# Patient Record
Sex: Female | Born: 1935 | Race: Black or African American | Hispanic: No | Smoking: Never smoker
Health system: Southern US, Community
[De-identification: ages and names within clinical notes are randomized; demographics above are authoritative.]

## PROBLEM LIST (undated history)

## (undated) DIAGNOSIS — N189 Chronic kidney disease, unspecified: Secondary | ICD-10-CM

## (undated) DIAGNOSIS — D649 Anemia, unspecified: Secondary | ICD-10-CM

## (undated) DIAGNOSIS — R3981 Functional urinary incontinence: Secondary | ICD-10-CM

## (undated) DIAGNOSIS — E039 Hypothyroidism, unspecified: Secondary | ICD-10-CM

## (undated) DIAGNOSIS — F419 Anxiety disorder, unspecified: Secondary | ICD-10-CM

## (undated) DIAGNOSIS — Z992 Dependence on renal dialysis: Secondary | ICD-10-CM

## (undated) DIAGNOSIS — N186 End stage renal disease: Secondary | ICD-10-CM

## (undated) DIAGNOSIS — I10 Essential (primary) hypertension: Secondary | ICD-10-CM

## (undated) DIAGNOSIS — E785 Hyperlipidemia, unspecified: Secondary | ICD-10-CM

## (undated) HISTORY — DX: Anemia, unspecified: D64.9

## (undated) HISTORY — DX: Essential (primary) hypertension: I10

## (undated) HISTORY — DX: Hyperlipidemia, unspecified: E78.5

## (undated) HISTORY — DX: Chronic kidney disease, unspecified: N18.9

---

## 2006-04-19 ENCOUNTER — Emergency Department (HOSPITAL_COMMUNITY): Admission: EM | Admit: 2006-04-19 | Discharge: 2006-04-19 | Payer: Self-pay | Admitting: Emergency Medicine

## 2006-04-21 ENCOUNTER — Emergency Department (HOSPITAL_COMMUNITY): Admission: EM | Admit: 2006-04-21 | Discharge: 2006-04-21 | Payer: Self-pay | Admitting: Family Medicine

## 2009-09-13 LAB — HM MAMMOGRAPHY

## 2009-10-03 ENCOUNTER — Ambulatory Visit: Payer: Self-pay | Admitting: Oncology

## 2009-10-13 HISTORY — PX: TRANSTHORACIC ECHOCARDIOGRAM: SHX275

## 2009-10-13 HISTORY — PX: NM MYOCAR PERF WALL MOTION: HXRAD629

## 2009-10-16 ENCOUNTER — Ambulatory Visit (HOSPITAL_COMMUNITY)
Admission: RE | Admit: 2009-10-16 | Discharge: 2009-10-16 | Payer: Self-pay | Source: Home / Self Care | Admitting: Oncology

## 2009-10-16 LAB — CBC WITH DIFFERENTIAL/PLATELET
BASO%: 0.3 % (ref 0.0–2.0)
Basophils Absolute: 0 10*3/uL (ref 0.0–0.1)
EOS%: 2.2 % (ref 0.0–7.0)
Eosinophils Absolute: 0.2 10*3/uL (ref 0.0–0.5)
HCT: 31.2 % — ABNORMAL LOW (ref 34.8–46.6)
HGB: 10 g/dL — ABNORMAL LOW (ref 11.6–15.9)
LYMPH%: 19.3 % (ref 14.0–49.7)
MCH: 29.7 pg (ref 25.1–34.0)
MCHC: 32.1 g/dL (ref 31.5–36.0)
MCV: 92.6 fL (ref 79.5–101.0)
MONO#: 0.3 10*3/uL (ref 0.1–0.9)
MONO%: 4.3 % (ref 0.0–14.0)
NEUT#: 5.4 10*3/uL (ref 1.5–6.5)
NEUT%: 73.9 % (ref 38.4–76.8)
Platelets: 215 10*3/uL (ref 145–400)
RBC: 3.37 10*6/uL — ABNORMAL LOW (ref 3.70–5.45)
RDW: 14.3 % (ref 11.2–14.5)
WBC: 7.2 10*3/uL (ref 3.9–10.3)
lymph#: 1.4 10*3/uL (ref 0.9–3.3)

## 2009-10-18 LAB — COMPREHENSIVE METABOLIC PANEL
ALT: 12 U/L (ref 0–35)
AST: 30 U/L (ref 0–37)
Albumin: 3.7 g/dL (ref 3.5–5.2)
Alkaline Phosphatase: 99 U/L (ref 39–117)
BUN: 49 mg/dL — ABNORMAL HIGH (ref 6–23)
CO2: 26 mEq/L (ref 19–32)
Calcium: 9.5 mg/dL (ref 8.4–10.5)
Chloride: 99 mEq/L (ref 96–112)
Creatinine, Ser: 2.48 mg/dL — ABNORMAL HIGH (ref 0.40–1.20)
Glucose, Bld: 127 mg/dL — ABNORMAL HIGH (ref 70–99)
Potassium: 4.6 mEq/L (ref 3.5–5.3)
Sodium: 135 mEq/L (ref 135–145)
Total Bilirubin: 0.8 mg/dL (ref 0.3–1.2)
Total Protein: 6.9 g/dL (ref 6.0–8.3)

## 2009-10-18 LAB — KAPPA/LAMBDA LIGHT CHAINS
Kappa free light chain: 3.84 mg/dL — ABNORMAL HIGH (ref 0.33–1.94)
Kappa:Lambda Ratio: 1.21 (ref 0.26–1.65)
Lambda Free Lght Chn: 3.17 mg/dL — ABNORMAL HIGH (ref 0.57–2.63)

## 2009-10-18 LAB — SPEP & IFE WITH QIG
Albumin ELP: 52.1 % — ABNORMAL LOW (ref 55.8–66.1)
Alpha-1-Globulin: 4.8 % (ref 2.9–4.9)
Alpha-2-Globulin: 13.3 % — ABNORMAL HIGH (ref 7.1–11.8)
Beta 2: 4.8 % (ref 3.2–6.5)
Beta Globulin: 5.1 % (ref 4.7–7.2)
Gamma Globulin: 19.9 % — ABNORMAL HIGH (ref 11.1–18.8)
IgA: 160 mg/dL (ref 68–378)
IgG (Immunoglobin G), Serum: 1540 mg/dL (ref 694–1618)
IgM, Serum: 58 mg/dL — ABNORMAL LOW (ref 60–263)
M-Spike, %: 0.86 g/dL
Total Protein, Serum Electrophoresis: 6.9 g/dL (ref 6.0–8.3)

## 2009-11-02 ENCOUNTER — Ambulatory Visit: Payer: Self-pay | Admitting: Oncology

## 2010-01-22 ENCOUNTER — Encounter (HOSPITAL_COMMUNITY)
Admission: RE | Admit: 2010-01-22 | Discharge: 2010-02-12 | Payer: Self-pay | Source: Home / Self Care | Attending: Nephrology | Admitting: Nephrology

## 2010-01-28 LAB — HEMOGLOBIN: Hemoglobin: 9.7 g/dL — ABNORMAL LOW (ref 12.0–15.0)

## 2010-02-01 LAB — IRON AND TIBC
Iron: 47 ug/dL (ref 42–135)
Saturation Ratios: 21 % (ref 20–55)
TIBC: 229 ug/dL — ABNORMAL LOW (ref 250–470)
UIBC: 182 ug/dL

## 2010-02-01 LAB — FERRITIN: Ferritin: 110 ng/mL (ref 10–291)

## 2010-02-04 LAB — RENAL FUNCTION PANEL
Albumin: 3 g/dL — ABNORMAL LOW (ref 3.5–5.2)
BUN: 65 mg/dL — ABNORMAL HIGH (ref 6–23)
CO2: 29 mEq/L (ref 19–32)
Calcium: 9.7 mg/dL (ref 8.4–10.5)
Chloride: 100 mEq/L (ref 96–112)
Creatinine, Ser: 3.31 mg/dL — ABNORMAL HIGH (ref 0.4–1.2)
GFR calc Af Amer: 16 mL/min — ABNORMAL LOW (ref >60)
GFR calc non Af Amer: 14 mL/min — ABNORMAL LOW (ref >60)
Glucose, Bld: 129 mg/dL — ABNORMAL HIGH (ref 70–99)
Phosphorus: 3.8 mg/dL (ref 2.3–4.6)
Potassium: 4.1 mEq/L (ref 3.5–5.1)
Sodium: 138 mEq/L (ref 135–145)

## 2010-02-04 LAB — HEMOGLOBIN: Hemoglobin: 10.4 g/dL — ABNORMAL LOW (ref 12.0–15.0)

## 2010-02-06 LAB — HEMOGLOBIN: Hemoglobin: 10.7 g/dL — ABNORMAL LOW (ref 12.0–15.0)

## 2010-02-14 ENCOUNTER — Encounter (HOSPITAL_COMMUNITY): Payer: Medicare HMO

## 2010-02-18 ENCOUNTER — Other Ambulatory Visit: Payer: Self-pay | Admitting: Nephrology

## 2010-02-18 ENCOUNTER — Ambulatory Visit (HOSPITAL_COMMUNITY): Payer: Medicare HMO | Attending: Nephrology

## 2010-02-18 DIAGNOSIS — D649 Anemia, unspecified: Secondary | ICD-10-CM | POA: Insufficient documentation

## 2010-02-18 LAB — HEMOGLOBIN AND HEMATOCRIT, BLOOD
HCT: 35.8 % — ABNORMAL LOW (ref 36.0–46.0)
Hemoglobin: 11.5 g/dL — ABNORMAL LOW (ref 12.0–15.0)

## 2010-02-18 LAB — FERRITIN: Ferritin: 894 ng/mL — ABNORMAL HIGH (ref 10–291)

## 2010-02-18 LAB — IRON AND TIBC
Iron: 60 ug/dL (ref 42–135)
Saturation Ratios: 30 % (ref 20–55)
TIBC: 199 ug/dL — ABNORMAL LOW (ref 250–470)
UIBC: 139 ug/dL

## 2010-02-26 ENCOUNTER — Other Ambulatory Visit (HOSPITAL_COMMUNITY): Payer: Medicare HMO

## 2010-02-27 ENCOUNTER — Encounter (HOSPITAL_COMMUNITY): Payer: Medicare HMO | Attending: Nephrology

## 2010-02-27 ENCOUNTER — Other Ambulatory Visit: Payer: Self-pay | Admitting: Nephrology

## 2010-02-27 DIAGNOSIS — N184 Chronic kidney disease, stage 4 (severe): Secondary | ICD-10-CM | POA: Insufficient documentation

## 2010-02-27 DIAGNOSIS — D638 Anemia in other chronic diseases classified elsewhere: Secondary | ICD-10-CM | POA: Insufficient documentation

## 2010-02-27 LAB — RENAL FUNCTION PANEL
Albumin: 3 g/dL — ABNORMAL LOW (ref 3.5–5.2)
BUN: 63 mg/dL — ABNORMAL HIGH (ref 6–23)
CO2: 28 mEq/L (ref 19–32)
Calcium: 9.5 mg/dL (ref 8.4–10.5)
Chloride: 100 mEq/L (ref 96–112)
Creatinine, Ser: 3.41 mg/dL — ABNORMAL HIGH (ref 0.4–1.2)
GFR calc Af Amer: 16 mL/min — ABNORMAL LOW (ref >60)
GFR calc non Af Amer: 13 mL/min — ABNORMAL LOW (ref >60)
Glucose, Bld: 136 mg/dL — ABNORMAL HIGH (ref 70–99)
Phosphorus: 4.2 mg/dL (ref 2.3–4.6)
Potassium: 4.6 mEq/L (ref 3.5–5.1)
Sodium: 137 mEq/L (ref 135–145)

## 2010-02-27 LAB — HEMOGLOBIN: Hemoglobin: 11.8 g/dL — ABNORMAL LOW (ref 12.0–15.0)

## 2010-03-05 ENCOUNTER — Other Ambulatory Visit: Payer: Self-pay | Admitting: Nephrology

## 2010-03-05 ENCOUNTER — Encounter (HOSPITAL_COMMUNITY): Payer: Medicare HMO

## 2010-03-05 LAB — HEMOGLOBIN: Hemoglobin: 11.8 g/dL — ABNORMAL LOW (ref 12.0–15.0)

## 2010-03-13 ENCOUNTER — Encounter (HOSPITAL_COMMUNITY): Payer: Medicare HMO

## 2010-03-20 ENCOUNTER — Other Ambulatory Visit: Payer: Self-pay | Admitting: Nephrology

## 2010-03-20 ENCOUNTER — Encounter (HOSPITAL_COMMUNITY): Payer: Medicare HMO | Attending: Nephrology

## 2010-03-20 DIAGNOSIS — D638 Anemia in other chronic diseases classified elsewhere: Secondary | ICD-10-CM | POA: Insufficient documentation

## 2010-03-20 DIAGNOSIS — N184 Chronic kidney disease, stage 4 (severe): Secondary | ICD-10-CM | POA: Insufficient documentation

## 2010-03-20 LAB — HEMOGLOBIN AND HEMATOCRIT, BLOOD
HCT: 38.1 % (ref 36.0–46.0)
Hemoglobin: 12.2 g/dL (ref 12.0–15.0)

## 2010-03-20 LAB — FERRITIN: Ferritin: 506 ng/mL — ABNORMAL HIGH (ref 10–291)

## 2010-03-20 LAB — IRON AND TIBC
Iron: 80 ug/dL (ref 42–135)
Saturation Ratios: 39 % (ref 20–55)
TIBC: 206 ug/dL — ABNORMAL LOW (ref 250–470)
UIBC: 126 ug/dL

## 2010-03-27 ENCOUNTER — Encounter (HOSPITAL_COMMUNITY): Payer: Medicare HMO

## 2010-04-02 ENCOUNTER — Encounter (HOSPITAL_COMMUNITY): Payer: Medicare HMO

## 2010-04-10 ENCOUNTER — Other Ambulatory Visit: Payer: Self-pay | Admitting: Nephrology

## 2010-04-10 ENCOUNTER — Other Ambulatory Visit: Payer: Self-pay | Admitting: Oncology

## 2010-04-10 ENCOUNTER — Encounter (HOSPITAL_COMMUNITY): Payer: Medicare HMO

## 2010-04-10 ENCOUNTER — Encounter (HOSPITAL_BASED_OUTPATIENT_CLINIC_OR_DEPARTMENT_OTHER): Payer: Medicare HMO | Admitting: Oncology

## 2010-04-10 DIAGNOSIS — D472 Monoclonal gammopathy: Secondary | ICD-10-CM

## 2010-04-10 DIAGNOSIS — N189 Chronic kidney disease, unspecified: Secondary | ICD-10-CM

## 2010-04-10 DIAGNOSIS — D631 Anemia in chronic kidney disease: Secondary | ICD-10-CM

## 2010-04-10 DIAGNOSIS — I129 Hypertensive chronic kidney disease with stage 1 through stage 4 chronic kidney disease, or unspecified chronic kidney disease: Secondary | ICD-10-CM

## 2010-04-10 LAB — IRON AND TIBC
Iron: 110 ug/dL (ref 42–135)
Saturation Ratios: 53 % (ref 20–55)
TIBC: 206 ug/dL — ABNORMAL LOW (ref 250–470)
UIBC: 96 ug/dL

## 2010-04-10 LAB — CBC WITH DIFFERENTIAL/PLATELET
BASO%: 0.2 % (ref 0.0–2.0)
Basophils Absolute: 0 10*3/uL (ref 0.0–0.1)
EOS%: 3.7 % (ref 0.0–7.0)
Eosinophils Absolute: 0.2 10*3/uL (ref 0.0–0.5)
HCT: 38.5 % (ref 34.8–46.6)
HGB: 12.4 g/dL (ref 11.6–15.9)
LYMPH%: 11.4 % — ABNORMAL LOW (ref 14.0–49.7)
MCH: 27.7 pg (ref 25.1–34.0)
MCHC: 32.2 g/dL (ref 31.5–36.0)
MCV: 86.1 fL (ref 79.5–101.0)
MONO#: 0.3 10*3/uL (ref 0.1–0.9)
MONO%: 4.6 % (ref 0.0–14.0)
NEUT#: 5.1 10*3/uL (ref 1.5–6.5)
NEUT%: 80.1 % — ABNORMAL HIGH (ref 38.4–76.8)
Platelets: 187 10*3/uL (ref 145–400)
RBC: 4.47 10*6/uL (ref 3.70–5.45)
RDW: 12.6 % (ref 11.2–14.5)
WBC: 6.4 10*3/uL (ref 3.9–10.3)
lymph#: 0.7 10*3/uL — ABNORMAL LOW (ref 0.9–3.3)

## 2010-04-10 LAB — COMPREHENSIVE METABOLIC PANEL
ALT: 23 U/L (ref 0–35)
AST: 37 U/L (ref 0–37)
Albumin: 3.3 g/dL — ABNORMAL LOW (ref 3.5–5.2)
Alkaline Phosphatase: 68 U/L (ref 39–117)
BUN: 61 mg/dL — ABNORMAL HIGH (ref 6–23)
CO2: 30 mEq/L (ref 19–32)
Calcium: 9.3 mg/dL (ref 8.4–10.5)
Chloride: 99 mEq/L (ref 96–112)
Creatinine, Ser: 3.09 mg/dL — ABNORMAL HIGH (ref 0.40–1.20)
Glucose, Bld: 98 mg/dL (ref 70–99)
Potassium: 4.3 mEq/L (ref 3.5–5.3)
Sodium: 138 mEq/L (ref 135–145)
Total Bilirubin: 0.7 mg/dL (ref 0.3–1.2)
Total Protein: 7.2 g/dL (ref 6.0–8.3)

## 2010-04-10 LAB — HEMOGLOBIN: Hemoglobin: 11.8 g/dL — ABNORMAL LOW (ref 12.0–15.0)

## 2010-04-10 LAB — FERRITIN: Ferritin: 498 ng/mL — ABNORMAL HIGH (ref 10–291)

## 2010-04-12 LAB — IMMUNOFIXATION ELECTROPHORESIS
IgA: 154 mg/dL (ref 68–378)
IgG (Immunoglobin G), Serum: 1470 mg/dL (ref 694–1618)
IgM, Serum: 49 mg/dL — ABNORMAL LOW (ref 60–263)
Total Protein, Serum Electrophoresis: 6.9 g/dL (ref 6.0–8.3)

## 2010-04-12 LAB — KAPPA/LAMBDA LIGHT CHAINS
Kappa free light chain: 5.9 mg/dL — ABNORMAL HIGH (ref 0.33–1.94)
Kappa:Lambda Ratio: 1.05 (ref 0.26–1.65)
Lambda Free Lght Chn: 5.6 mg/dL — ABNORMAL HIGH (ref 0.57–2.63)

## 2010-04-24 ENCOUNTER — Encounter (HOSPITAL_COMMUNITY): Payer: Medicare HMO | Attending: Nephrology

## 2010-04-24 ENCOUNTER — Other Ambulatory Visit: Payer: Self-pay | Admitting: Nephrology

## 2010-04-24 DIAGNOSIS — N184 Chronic kidney disease, stage 4 (severe): Secondary | ICD-10-CM | POA: Insufficient documentation

## 2010-04-24 DIAGNOSIS — D638 Anemia in other chronic diseases classified elsewhere: Secondary | ICD-10-CM | POA: Insufficient documentation

## 2010-04-24 LAB — HEMOGLOBIN: Hemoglobin: 12 g/dL (ref 12.0–15.0)

## 2010-05-03 ENCOUNTER — Ambulatory Visit (INDEPENDENT_AMBULATORY_CARE_PROVIDER_SITE_OTHER): Payer: Medicare HMO | Admitting: Internal Medicine

## 2010-05-03 ENCOUNTER — Encounter: Payer: Self-pay | Admitting: Internal Medicine

## 2010-05-03 VITALS — BP 134/78 | HR 58 | Temp 98.1°F | Resp 16

## 2010-05-03 DIAGNOSIS — F419 Anxiety disorder, unspecified: Secondary | ICD-10-CM | POA: Insufficient documentation

## 2010-05-03 DIAGNOSIS — E7800 Pure hypercholesterolemia, unspecified: Secondary | ICD-10-CM | POA: Insufficient documentation

## 2010-05-03 DIAGNOSIS — I10 Essential (primary) hypertension: Secondary | ICD-10-CM | POA: Insufficient documentation

## 2010-05-03 DIAGNOSIS — F411 Generalized anxiety disorder: Secondary | ICD-10-CM

## 2010-05-03 DIAGNOSIS — E782 Mixed hyperlipidemia: Secondary | ICD-10-CM

## 2010-05-03 MED ORDER — ROSUVASTATIN CALCIUM 10 MG PO TABS: 10.0000 mg | ORAL_TABLET | Freq: Every day | ORAL | Status: DC

## 2010-05-03 MED ORDER — DIAZEPAM 5 MG PO TABS: 5.0000 mg | ORAL_TABLET | Freq: Two times a day (BID) | ORAL | Status: DC

## 2010-05-03 NOTE — Patient Instructions (Signed)
Anxiety and Panic Attacks Your caregiver has informed you that you are having an anxiety or panic attack. There may be many forms of this. Most of the time these attacks come suddenly and without warning. They come at any time of day, including periods of sleep, and at any time of life. They may be strong and unexplained. Although panic attacks are very scary, they are physically harmless. Sometimes the cause of your anxiety is not known. Anxiety is a protective mechanism of the body in its fight or flight mechanism. Most of these perceived danger situations are actually nonphysical situations (such as anxiety over losing a job). CAUSES The causes of an anxiety or panic attack are many. Panic attacks may occur in otherwise healthy people given a certain set of circumstances. There may be a genetic cause for panic attacks. Some medications may also have anxiety as a side effect. SYMPTOMS Some of the most common feelings are:  Intense terror.  Dizziness, feeling faint.   Hot and cold flashes.   Fear of going crazy.   Feelings that nothing is real.   Sweating.   Shaking.   Chest pain or a fast heartbeat (palpitations).  Smothering, choking sensations.   Feelings of impending doom and that death is near.   Tingling of extremities, this may be from over breathing.   Altered reality (derealization).   Being detached from yourself (depersonalization).   Several symptoms can be present to make up anxiety or panic attacks. DIAGNOSIS The evaluation by your caregiver will depend on the type of symptoms you are experiencing. The diagnosis of anxiety or pain attack is made when no physical illness can be determined to be a cause of the symptoms. TREATMENT Treatment to prevent anxiety and panic attacks may include:  Avoidance of circumstances that cause anxiety.   Reassurance and relaxation.   Regular exercise.   Relaxation therapies, such as yoga.   Psychotherapy with a psychiatrist  or therapist.   Avoidance of caffeine, alcohol and illegal drugs.   Prescribed medication.  SEEK IMMEDIATE MEDICAL CARE IF:  You experience panic attack symptoms that are different than your usual symptoms.   You have any worsening or concerning symptoms.  Document Released: 12/30/2004 Document Re-Released: 06/19/2009 Exit[employer] Patient Information 2011 [employer]. 

## 2010-05-03 NOTE — Assessment & Plan Note (Signed)
Her BP is well controlled 

## 2010-05-03 NOTE — Assessment & Plan Note (Signed)
Continue valium prn 

## 2010-05-03 NOTE — Assessment & Plan Note (Signed)
Continue crestor 

## 2010-05-03 NOTE — Progress Notes (Signed)
Subjective:    Patient ID: Jaclyn Morgan, female    DOB: Sep 03, 1932, 77 y.o.   MRN: [id number]  Hypertension This is a chronic problem. The current episode started more than 1 year ago. The problem has been gradually improving since onset. The problem is controlled. Associated symptoms include anxiety. Pertinent negatives include no blurred vision, chest pain, headaches, malaise/fatigue, neck pain, orthopnea, palpitations, peripheral edema, PND, shortness of breath or sweats. There are no associated agents to hypertension. Past treatments include beta blockers, diuretics and alpha 1 blockers. The current treatment provides moderate improvement. There are no compliance problems.  Hypertensive end-organ damage includes kidney disease. Identifiable causes of hypertension include chronic renal disease.  Anxiety Presents for follow-up visit. Symptoms include excessive worry, nervous/anxious behavior and panic. Patient reports no chest pain, compulsions, confusion, decreased concentration, depressed mood, dizziness, dry mouth, feeling of choking, hyperventilation, insomnia, irritability, malaise, muscle tension, nausea, obsessions, palpitations, restlessness, shortness of breath or suicidal ideas. Symptoms occur most days. The severity of symptoms is moderate. The patient sleeps 7 hours per night. The quality of sleep is good. Nighttime awakenings: occasional.   Compliance with medications is 76-100%.   She tells me that she has been seeing a kidney doctor, cardiologist, blood specialist, and an eye doctor recently.   Review of Systems  Constitutional: Negative for malaise/fatigue and irritability.  HENT: Negative for neck pain.   Eyes: Negative for blurred vision.  Respiratory: Negative for shortness of breath.   Cardiovascular: Negative for chest pain, palpitations, orthopnea and PND.  Gastrointestinal: Negative for nausea.  Neurological: Negative for dizziness and headaches.    Psychiatric/Behavioral: Negative for suicidal ideas, confusion and decreased concentration. The patient is nervous/anxious. The patient does not have insomnia.    Lab Results  Component Value Date   HGB 12.0 04/24/2010   HCT 38.5 04/10/2010   PLT 187 04/10/2010   ALT 23 04/10/2010   AST 37 04/10/2010   NA 138 04/10/2010   K 4.3 04/10/2010   CL 99 04/10/2010   CREATININE 3.09* 04/10/2010   BUN 61* 04/10/2010   CO2 30 04/10/2010      Objective:   Physical Exam  Constitutional: She is oriented to person, place, and time. She appears well-developed and well-nourished. No distress.  HENT:  Head: Normocephalic and atraumatic.  Right Ear: External ear normal.  Left Ear: External ear normal.  Nose: Nose normal.  Mouth/Throat: No oropharyngeal exudate.  Eyes: Conjunctivae and EOM are normal. Pupils are equal, round, and reactive to light. Right eye exhibits no discharge. Left eye exhibits no discharge. No scleral icterus.  Neck: Normal range of motion. Neck supple. No JVD present. No tracheal deviation present. No thyromegaly present.  Cardiovascular: Normal rate, regular rhythm, normal heart sounds and intact distal pulses.  Exam reveals no gallop and no friction rub.   No murmur heard. Pulmonary/Chest: Effort normal and breath sounds normal. No stridor. No respiratory distress. She has no wheezes. She has no rales. She exhibits no tenderness.  Abdominal: Soft. Bowel sounds are normal. She exhibits no distension and no mass. There is no tenderness. There is no rebound and no guarding.  Musculoskeletal: Normal range of motion. She exhibits no tenderness.  Lymphadenopathy:    She has no cervical adenopathy.  Neurological: She is alert and oriented to person, place, and time. She has normal reflexes. No cranial nerve deficit. She exhibits normal muscle tone. Coordination normal.  Skin: Skin is warm and dry. No rash noted. She is not diaphoretic. No erythema.  No pallor.  Psychiatric: She has a normal  mood and affect. Her behavior is normal. Judgment and thought content normal.          Assessment & Plan:

## 2010-05-08 ENCOUNTER — Other Ambulatory Visit: Payer: Self-pay | Admitting: Nephrology

## 2010-05-08 ENCOUNTER — Encounter (HOSPITAL_COMMUNITY): Payer: Medicare HMO

## 2010-05-08 DIAGNOSIS — N189 Chronic kidney disease, unspecified: Secondary | ICD-10-CM | POA: Insufficient documentation

## 2010-05-08 DIAGNOSIS — D649 Anemia, unspecified: Secondary | ICD-10-CM | POA: Insufficient documentation

## 2010-05-08 LAB — RENAL FUNCTION PANEL
Albumin: 3.2 g/dL — ABNORMAL LOW (ref 3.5–5.2)
BUN: 65 mg/dL — ABNORMAL HIGH (ref 6–23)
CO2: 29 mEq/L (ref 19–32)
Calcium: 9.5 mg/dL (ref 8.4–10.5)
Chloride: 101 mEq/L (ref 96–112)
Creatinine, Ser: 3.04 mg/dL — ABNORMAL HIGH (ref 0.4–1.2)
GFR calc Af Amer: 18 mL/min — ABNORMAL LOW (ref >60)
GFR calc non Af Amer: 15 mL/min — ABNORMAL LOW (ref >60)
Glucose, Bld: 111 mg/dL — ABNORMAL HIGH (ref 70–99)
Phosphorus: 3.2 mg/dL (ref 2.3–4.6)
Potassium: 3.6 mEq/L (ref 3.5–5.1)
Sodium: 139 mEq/L (ref 135–145)

## 2010-05-08 LAB — HEMOGLOBIN: Hemoglobin: 12.1 g/dL (ref 12.0–15.0)

## 2010-05-09 LAB — IRON AND TIBC
Iron: 91 ug/dL (ref 42–135)
Saturation Ratios: 44 % (ref 20–55)
TIBC: 205 ug/dL — ABNORMAL LOW (ref 250–470)
UIBC: 114 ug/dL

## 2010-05-09 LAB — FERRITIN: Ferritin: 531 ng/mL — ABNORMAL HIGH (ref 10–291)

## 2010-05-29 ENCOUNTER — Encounter (HOSPITAL_COMMUNITY): Payer: Medicare HMO | Attending: Nephrology

## 2010-05-29 ENCOUNTER — Other Ambulatory Visit: Payer: Self-pay | Admitting: Nephrology

## 2010-05-29 DIAGNOSIS — D649 Anemia, unspecified: Secondary | ICD-10-CM | POA: Insufficient documentation

## 2010-05-29 LAB — HEMOGLOBIN: Hemoglobin: 10.8 g/dL — ABNORMAL LOW (ref 12.0–15.0)

## 2010-06-07 HISTORY — PX: CATARACT EXTRACTION: SUR2

## 2010-06-12 ENCOUNTER — Other Ambulatory Visit: Payer: Self-pay | Admitting: Nephrology

## 2010-06-12 ENCOUNTER — Encounter (HOSPITAL_COMMUNITY): Payer: Medicare HMO

## 2010-06-12 LAB — RENAL FUNCTION PANEL
BUN: 62 mg/dL — ABNORMAL HIGH (ref 6–23)
CO2: 28 mEq/L (ref 19–32)
Calcium: 9.5 mg/dL (ref 8.4–10.5)
Glucose, Bld: 123 mg/dL — ABNORMAL HIGH (ref 70–99)
Phosphorus: 3.9 mg/dL (ref 2.3–4.6)

## 2010-06-12 LAB — HEMOGLOBIN AND HEMATOCRIT, BLOOD: Hemoglobin: 10.9 g/dL — ABNORMAL LOW (ref 12.0–15.0)

## 2010-06-13 LAB — IRON AND TIBC
TIBC: 200 ug/dL — ABNORMAL LOW (ref 250–470)
UIBC: 120 ug/dL

## 2010-06-13 LAB — FERRITIN: Ferritin: 482 ng/mL — ABNORMAL HIGH (ref 10–291)

## 2010-06-26 ENCOUNTER — Ambulatory Visit (HOSPITAL_COMMUNITY)
Admission: RE | Admit: 2010-06-26 | Discharge: 2010-06-26 | Disposition: A | Discharge status: Home / Self Care | Payer: Medicare HMO | Source: Ambulatory Visit | Attending: Nephrology | Admitting: Nephrology

## 2010-06-26 ENCOUNTER — Other Ambulatory Visit: Payer: Self-pay | Admitting: Nephrology

## 2010-06-26 DIAGNOSIS — D649 Anemia, unspecified: Secondary | ICD-10-CM | POA: Insufficient documentation

## 2010-06-26 LAB — HEMOGLOBIN: Hemoglobin: 11 g/dL — ABNORMAL LOW (ref 12.0–15.0)

## 2010-07-11 ENCOUNTER — Other Ambulatory Visit (HOSPITAL_COMMUNITY): Payer: Medicare HMO

## 2010-07-16 ENCOUNTER — Other Ambulatory Visit: Payer: Self-pay | Admitting: Nephrology

## 2010-07-16 ENCOUNTER — Ambulatory Visit (HOSPITAL_COMMUNITY): Payer: Medicare HMO | Attending: Nephrology

## 2010-07-16 DIAGNOSIS — N184 Chronic kidney disease, stage 4 (severe): Secondary | ICD-10-CM | POA: Insufficient documentation

## 2010-07-16 DIAGNOSIS — D638 Anemia in other chronic diseases classified elsewhere: Secondary | ICD-10-CM | POA: Insufficient documentation

## 2010-07-16 LAB — RENAL FUNCTION PANEL
CO2: 27 mEq/L (ref 19–32)
Calcium: 10.2 mg/dL (ref 8.4–10.5)
Creatinine, Ser: 3.07 mg/dL — ABNORMAL HIGH (ref 0.50–1.10)
Glucose, Bld: 90 mg/dL (ref 70–99)

## 2010-07-16 LAB — IRON AND TIBC
Saturation Ratios: 41 % (ref 20–55)
UIBC: 124 ug/dL

## 2010-07-16 LAB — HEMOGLOBIN AND HEMATOCRIT, BLOOD: HCT: 33.5 % — ABNORMAL LOW (ref 36.0–46.0)

## 2010-07-16 LAB — FERRITIN: Ferritin: 445 ng/mL — ABNORMAL HIGH (ref 10–291)

## 2010-07-23 ENCOUNTER — Other Ambulatory Visit (HOSPITAL_COMMUNITY): Payer: Medicare HMO

## 2010-07-30 ENCOUNTER — Other Ambulatory Visit: Payer: Self-pay | Admitting: Nephrology

## 2010-07-30 ENCOUNTER — Ambulatory Visit (HOSPITAL_COMMUNITY): Payer: Medicare HMO | Attending: Nephrology

## 2010-07-30 DIAGNOSIS — D649 Anemia, unspecified: Secondary | ICD-10-CM | POA: Insufficient documentation

## 2010-07-30 LAB — HEMOGLOBIN: Hemoglobin: 10.5 g/dL — ABNORMAL LOW (ref 12.0–15.0)

## 2010-08-13 ENCOUNTER — Other Ambulatory Visit: Payer: Self-pay | Admitting: Nephrology

## 2010-08-13 ENCOUNTER — Encounter (HOSPITAL_COMMUNITY): Payer: Medicare HMO | Attending: Nephrology

## 2010-08-13 DIAGNOSIS — D649 Anemia, unspecified: Secondary | ICD-10-CM | POA: Insufficient documentation

## 2010-08-13 LAB — RENAL FUNCTION PANEL
BUN: 78 mg/dL — ABNORMAL HIGH (ref 6–23)
CO2: 30 mEq/L (ref 19–32)
Chloride: 97 mEq/L (ref 96–112)
GFR calc Af Amer: 16 mL/min — ABNORMAL LOW (ref >60)
Glucose, Bld: 155 mg/dL — ABNORMAL HIGH (ref 70–99)
Potassium: 4.1 mEq/L (ref 3.5–5.1)
Sodium: 136 mEq/L (ref 135–145)

## 2010-08-13 LAB — IRON AND TIBC
Iron: 61 ug/dL (ref 42–135)
TIBC: 203 ug/dL — ABNORMAL LOW (ref 250–470)
UIBC: 142 ug/dL

## 2010-08-13 LAB — FERRITIN: Ferritin: 421 ng/mL — ABNORMAL HIGH (ref 10–291)

## 2010-08-27 ENCOUNTER — Encounter (HOSPITAL_COMMUNITY): Payer: Medicare HMO | Attending: Nephrology

## 2010-08-27 ENCOUNTER — Other Ambulatory Visit: Payer: Self-pay | Admitting: Nephrology

## 2010-08-27 ENCOUNTER — Other Ambulatory Visit (HOSPITAL_COMMUNITY): Payer: Medicare HMO

## 2010-08-27 DIAGNOSIS — D649 Anemia, unspecified: Secondary | ICD-10-CM | POA: Insufficient documentation

## 2010-08-28 ENCOUNTER — Other Ambulatory Visit (HOSPITAL_COMMUNITY): Payer: Medicare HMO

## 2010-09-10 ENCOUNTER — Other Ambulatory Visit: Payer: Self-pay | Admitting: Nephrology

## 2010-09-10 ENCOUNTER — Encounter (HOSPITAL_COMMUNITY): Payer: Medicare HMO

## 2010-09-10 LAB — IRON AND TIBC
Iron: 58 ug/dL (ref 42–135)
Saturation Ratios: 31 % (ref 20–55)
UIBC: 132 ug/dL (ref 125–400)

## 2010-09-10 LAB — RENAL FUNCTION PANEL
BUN: 65 mg/dL — ABNORMAL HIGH (ref 6–23)
CO2: 27 mEq/L (ref 19–32)
Calcium: 9.5 mg/dL (ref 8.4–10.5)
GFR calc Af Amer: 17 mL/min — ABNORMAL LOW (ref >60)
Glucose, Bld: 170 mg/dL — ABNORMAL HIGH (ref 70–99)
Phosphorus: 4.1 mg/dL (ref 2.3–4.6)

## 2010-09-10 LAB — FERRITIN: Ferritin: 300 ng/mL — ABNORMAL HIGH (ref 10–291)

## 2010-09-30 ENCOUNTER — Telehealth: Payer: Self-pay

## 2010-09-30 DIAGNOSIS — N189 Chronic kidney disease, unspecified: Secondary | ICD-10-CM | POA: Insufficient documentation

## 2010-09-30 NOTE — Telephone Encounter (Signed)
done

## 2010-09-30 NOTE — Telephone Encounter (Signed)
Daughter called stating that pt now has humana and requires referral to any specialist from PCP. Patient needs referral to her nephrologist Dr Daphine Deutscher Web. Thanks

## 2010-10-01 NOTE — Telephone Encounter (Signed)
Patient notified

## 2010-10-02 ENCOUNTER — Other Ambulatory Visit: Payer: Self-pay | Admitting: Nephrology

## 2010-10-02 ENCOUNTER — Ambulatory Visit (HOSPITAL_COMMUNITY): Payer: Medicare HMO | Attending: Nephrology

## 2010-10-02 DIAGNOSIS — D649 Anemia, unspecified: Secondary | ICD-10-CM | POA: Insufficient documentation

## 2010-10-02 LAB — IRON AND TIBC: TIBC: 207 ug/dL — ABNORMAL LOW (ref 250–470)

## 2010-10-09 ENCOUNTER — Other Ambulatory Visit: Payer: Self-pay | Admitting: Oncology

## 2010-10-09 ENCOUNTER — Encounter (HOSPITAL_BASED_OUTPATIENT_CLINIC_OR_DEPARTMENT_OTHER): Payer: Medicare HMO | Admitting: Oncology

## 2010-10-09 DIAGNOSIS — I129 Hypertensive chronic kidney disease with stage 1 through stage 4 chronic kidney disease, or unspecified chronic kidney disease: Secondary | ICD-10-CM

## 2010-10-09 DIAGNOSIS — D631 Anemia in chronic kidney disease: Secondary | ICD-10-CM

## 2010-10-09 DIAGNOSIS — D472 Monoclonal gammopathy: Secondary | ICD-10-CM

## 2010-10-09 DIAGNOSIS — N189 Chronic kidney disease, unspecified: Secondary | ICD-10-CM

## 2010-10-09 LAB — CBC WITH DIFFERENTIAL/PLATELET
Basophils Absolute: 0 10*3/uL (ref 0.0–0.1)
Eosinophils Absolute: 0.2 10*3/uL (ref 0.0–0.5)
HCT: 36.9 % (ref 34.8–46.6)
HGB: 12 g/dL (ref 11.6–15.9)
LYMPH%: 9.5 % — ABNORMAL LOW (ref 14.0–49.7)
MCHC: 32.5 g/dL (ref 31.5–36.0)
MONO#: 0.3 10*3/uL (ref 0.1–0.9)
NEUT#: 5.7 10*3/uL (ref 1.5–6.5)
NEUT%: 82.4 % — ABNORMAL HIGH (ref 38.4–76.8)
Platelets: 176 10*3/uL (ref 145–400)
WBC: 6.9 10*3/uL (ref 3.9–10.3)
lymph#: 0.7 10*3/uL — ABNORMAL LOW (ref 0.9–3.3)

## 2010-10-11 LAB — COMPREHENSIVE METABOLIC PANEL
ALT: 28 U/L (ref 0–35)
AST: 45 U/L — ABNORMAL HIGH (ref 0–37)
CO2: 27 mEq/L (ref 19–32)
Calcium: 8.9 mg/dL (ref 8.4–10.5)
Chloride: 101 mEq/L (ref 96–112)
Potassium: 3.7 mEq/L (ref 3.5–5.3)
Sodium: 140 mEq/L (ref 135–145)
Total Protein: 6.3 g/dL (ref 6.0–8.3)

## 2010-10-11 LAB — SPEP & IFE WITH QIG
Albumin ELP: 54.6 % — ABNORMAL LOW (ref 55.8–66.1)
Alpha-2-Globulin: 13.5 % — ABNORMAL HIGH (ref 7.1–11.8)
Beta Globulin: 4.3 % — ABNORMAL LOW (ref 4.7–7.2)
M-Spike, %: 0.68 g/dL
Total Protein, Serum Electrophoresis: 6.3 g/dL (ref 6.0–8.3)

## 2010-10-11 LAB — KAPPA/LAMBDA LIGHT CHAINS: Kappa free light chain: 7.95 mg/dL — ABNORMAL HIGH (ref 0.33–1.94)

## 2010-10-15 ENCOUNTER — Observation Stay (HOSPITAL_COMMUNITY): Payer: Medicare HMO

## 2010-10-16 ENCOUNTER — Ambulatory Visit: Payer: Medicare HMO | Admitting: Internal Medicine

## 2010-10-22 ENCOUNTER — Encounter (HOSPITAL_COMMUNITY): Payer: Medicare HMO

## 2010-10-23 ENCOUNTER — Encounter: Payer: Self-pay | Admitting: Internal Medicine

## 2010-10-23 ENCOUNTER — Ambulatory Visit (INDEPENDENT_AMBULATORY_CARE_PROVIDER_SITE_OTHER): Payer: Medicare HMO | Admitting: Internal Medicine

## 2010-10-23 VITALS — BP 124/82 | HR 72 | Temp 97.5°F | Resp 16

## 2010-10-23 DIAGNOSIS — J209 Acute bronchitis, unspecified: Secondary | ICD-10-CM | POA: Insufficient documentation

## 2010-10-23 DIAGNOSIS — F411 Generalized anxiety disorder: Secondary | ICD-10-CM

## 2010-10-23 DIAGNOSIS — I10 Essential (primary) hypertension: Secondary | ICD-10-CM

## 2010-10-23 DIAGNOSIS — F419 Anxiety disorder, unspecified: Secondary | ICD-10-CM

## 2010-10-23 MED ORDER — DIAZEPAM 5 MG PO TABS: 5.0000 mg | ORAL_TABLET | Freq: Two times a day (BID) | ORAL | Status: DC

## 2010-10-23 MED ORDER — CEFUROXIME AXETIL 500 MG PO TABS: 500.0000 mg | ORAL_TABLET | Freq: Two times a day (BID) | ORAL | Status: AC

## 2010-10-23 NOTE — Patient Instructions (Signed)
Acute Bronchitis You have acute bronchitis. This means you have a chest cold. The airways in your lungs are inflamed (red and sore). Acute means it is sudden onset. Bronchitis is most often caused by a virus. In smokers, people with chronic lung problems, and elderly patients, treatment with antibiotics for bacterial infection may be needed. Exposure to cigarette smoke or irritating chemicals will make bronchitis worse. Allergies and asthma can also make bronchitis worse. Repeated episodes of bronchitis may cause long standing lung problems. Acute bronchitis is usually treated with rest, fluids, and medicines for relief of fever or cough. Bronchodilator medicines from metered inhalers or a nebulizer may be used to help open up the small airways. This reduces shortness of breath and helps control cough. Antibiotics can be prescribed if you are more seriously ill or at risk. A cool air vaporizer may help thin bronchial secretions and make it easier to clear your chest. Increased fluids may also help. You must avoid smoking, even second hand exposure. If you are a cigarette smoker, consider using nicotine gum or skin patches to help control withdrawal symptoms. Recovery from bronchitis is often slow, but you should start feeling better after 2-3 days. Cough from bronchitis frequently lasts for 3-4 weeks.  SEEK IMMEDIATE MEDICAL CARE IF YOU DEVELOP:  Increased fever, chills, or chest pain.   Severe shortness of breath or bloody sputum.   Dehydration, fainting, repeated vomiting, severe headache.   No improvement after one week of proper treatment.  MAKE SURE YOU:   Understand these instructions.   Will watch your condition.   Will get help right away if you are not doing well or get worse.  Document Released: 02/07/2004 Document Re-Released: 12/13/2007 Exit[employer] Patient Information 2011 [employer]. 

## 2010-10-23 NOTE — Progress Notes (Signed)
Subjective:    Patient ID: Jaclyn Morgan, female    DOB: [birth year], 77 y.o.   MRN: [id number]  URI  This is a new problem. The current episode started 1 to 4 weeks ago. The problem has been unchanged. There has been no fever. Associated symptoms include congestion and coughing. Pertinent negatives include no abdominal pain, chest pain, diarrhea, dysuria, ear pain, headaches, joint pain, joint swelling, nausea, neck pain, plugged ear sensation, rash, rhinorrhea, sinus pain, sneezing, sore throat, swollen glands, vomiting or wheezing. She has tried nothing for the symptoms.      Review of Systems  Constitutional: Negative for fever, chills, diaphoresis, activity change, appetite change, fatigue and unexpected weight change.  HENT: Positive for congestion. Negative for ear pain, sore throat, facial swelling, rhinorrhea, sneezing, neck pain, voice change, postnasal drip, sinus pressure and ear discharge.   Eyes: Negative.   Respiratory: Positive for cough. Negative for apnea, choking, chest tightness, shortness of breath, wheezing and stridor.   Cardiovascular: Negative for chest pain, palpitations and leg swelling.  Gastrointestinal: Negative for nausea, vomiting, abdominal pain, diarrhea, constipation, blood in stool, abdominal distention and anal bleeding.  Genitourinary: Negative for dysuria, urgency, frequency, hematuria, flank pain, decreased urine volume, enuresis, difficulty urinating and dyspareunia.  Musculoskeletal: Negative for myalgias, back pain, joint pain, joint swelling, arthralgias and gait problem.  Skin: Negative for color change, pallor, rash and wound.  Neurological: Negative for dizziness, tremors, seizures, syncope, facial asymmetry, speech difficulty, weakness, light-headedness, numbness and headaches.  Hematological: Negative for adenopathy. Does not bruise/bleed easily.  Psychiatric/Behavioral: Negative for suicidal ideas, hallucinations, behavioral problems,  confusion, sleep disturbance, self-injury, dysphoric mood, decreased concentration and agitation. The patient is nervous/anxious. The patient is not hyperactive.        Objective:   Physical Exam  Vitals reviewed. Constitutional: She is oriented to person, place, and time. She appears well-developed and well-nourished. No distress.  HENT:  Mouth/Throat: Oropharynx is clear and moist. No oropharyngeal exudate.  Eyes: Conjunctivae are normal. Right eye exhibits no discharge. Left eye exhibits no discharge. No scleral icterus.  Neck: Normal range of motion. Neck supple. No JVD present. No tracheal deviation present. No thyromegaly present.  Cardiovascular: Normal rate, regular rhythm, normal heart sounds and intact distal pulses.  Exam reveals no gallop and no friction rub.   No murmur heard. Pulmonary/Chest: Effort normal and breath sounds normal. No stridor. No respiratory distress. She has no wheezes. She has no rales. She exhibits no tenderness.  Abdominal: Soft. Bowel sounds are normal. She exhibits no distension and no mass. There is no tenderness. There is no rebound and no guarding.  Musculoskeletal: Normal range of motion. She exhibits no edema and no tenderness.  Lymphadenopathy:    She has no cervical adenopathy.  Neurological: She is oriented to person, place, and time. She displays normal reflexes. She exhibits normal muscle tone. Coordination normal.  Skin: Skin is warm and dry. No rash noted. She is not diaphoretic. No erythema. No pallor.  Psychiatric: She has a normal mood and affect. Her behavior is normal. Judgment and thought content normal.      Lab Results  Component Value Date   HGB 12.0 10/09/2010   HCT 36.9 10/09/2010   PLT 176 10/09/2010   GLUCOSE 136* 10/09/2010   GLUCOSE 136* 10/09/2010   GLUCOSE 136* 10/09/2010   GLUCOSE 136* 10/09/2010   GLUCOSE 136* 10/09/2010   ALT 28 10/09/2010   ALT 28 10/09/2010   ALT 28 10/09/2010   ALT 28  10/09/2010   ALT 28 10/09/2010    AST 45* 10/09/2010   AST 45* 10/09/2010   AST 45* 10/09/2010   AST 45* 10/09/2010   AST 45* 10/09/2010   NA 140 10/09/2010   NA 140 10/09/2010   NA 140 10/09/2010   NA 140 10/09/2010   NA 140 10/09/2010   K 3.7 10/09/2010   K 3.7 10/09/2010   K 3.7 10/09/2010   K 3.7 10/09/2010   K 3.7 10/09/2010   CL 101 10/09/2010   CL 101 10/09/2010   CL 101 10/09/2010   CL 101 10/09/2010   CL 101 10/09/2010   CREATININE 3.13* 10/09/2010   CREATININE 3.13* 10/09/2010   CREATININE 3.13* 10/09/2010   CREATININE 3.13* 10/09/2010   CREATININE 3.13* 10/09/2010   BUN 76* 10/09/2010   BUN 76* 10/09/2010   BUN 76* 10/09/2010   BUN 76* 10/09/2010   BUN 76* 10/09/2010   CO2 27 10/09/2010   CO2 27 10/09/2010   CO2 27 10/09/2010   CO2 27 10/09/2010   CO2 27 10/09/2010      Assessment & Plan:

## 2010-10-23 NOTE — Assessment & Plan Note (Signed)
Start ceftin 

## 2010-10-23 NOTE — Assessment & Plan Note (Signed)
Continue valium prn 

## 2010-10-23 NOTE — Assessment & Plan Note (Signed)
Her BP is well controlled 

## 2010-10-29 ENCOUNTER — Other Ambulatory Visit: Payer: Self-pay | Admitting: Nephrology

## 2010-10-29 ENCOUNTER — Encounter (HOSPITAL_COMMUNITY)
Admission: RE | Admit: 2010-10-29 | Discharge: 2010-10-29 | Disposition: A | Discharge status: Home / Self Care | Payer: Medicare HMO | Source: Ambulatory Visit | Attending: Nephrology | Admitting: Nephrology

## 2010-10-29 DIAGNOSIS — D649 Anemia, unspecified: Secondary | ICD-10-CM | POA: Insufficient documentation

## 2010-10-29 LAB — HEMOGLOBIN: Hemoglobin: 11.2 g/dL — ABNORMAL LOW (ref 12.0–15.0)

## 2010-11-13 ENCOUNTER — Other Ambulatory Visit: Payer: Self-pay | Admitting: Nephrology

## 2010-11-13 ENCOUNTER — Encounter (HOSPITAL_COMMUNITY): Payer: Medicare HMO

## 2010-11-13 LAB — IRON AND TIBC
Iron: 64 ug/dL (ref 42–135)
Saturation Ratios: 34 % (ref 20–55)
TIBC: 188 ug/dL — ABNORMAL LOW (ref 250–470)
UIBC: 124 ug/dL — ABNORMAL LOW (ref 125–400)

## 2010-11-13 LAB — FERRITIN: Ferritin: 282 ng/mL (ref 10–291)

## 2010-11-26 ENCOUNTER — Other Ambulatory Visit (HOSPITAL_COMMUNITY): Payer: Self-pay | Admitting: *Deleted

## 2010-11-27 ENCOUNTER — Encounter (HOSPITAL_COMMUNITY): Payer: Self-pay

## 2010-11-27 ENCOUNTER — Encounter (HOSPITAL_COMMUNITY)
Admission: RE | Admit: 2010-11-27 | Discharge: 2010-11-27 | Disposition: A | Discharge status: Home / Self Care | Payer: Medicare HMO | Source: Ambulatory Visit | Attending: Nephrology | Admitting: Nephrology

## 2010-11-27 ENCOUNTER — Ambulatory Visit: Payer: Medicare HMO | Admitting: Internal Medicine

## 2010-11-27 DIAGNOSIS — N189 Chronic kidney disease, unspecified: Secondary | ICD-10-CM | POA: Insufficient documentation

## 2010-11-27 LAB — HEMOGLOBIN: Hemoglobin: 10.9 g/dL — ABNORMAL LOW (ref 12.0–15.0)

## 2010-11-27 MED ORDER — CLONIDINE HCL 0.1 MG PO TABS
0.1000 mg | ORAL_TABLET | Freq: Once | ORAL | Status: AC | PRN
  Administered 2010-11-27: 0.1 mg via ORAL
  Filled 2010-11-27: qty 1

## 2010-11-27 MED ORDER — DARBEPOETIN ALFA-POLYSORBATE 500 MCG/ML IJ SOLN
40.0000 ug | INTRAMUSCULAR | Status: DC
  Filled 2010-11-27: qty 1

## 2010-11-27 NOTE — Progress Notes (Signed)
Pt's BP continues to rise. Clonidine given as ordered and after 30 minutes BP remains elevated see Doc Flow sheet.  Pt staes she has taken all her AM BP meds plus her Lasix but has had an upsetting morning and when she is stressed her BP stays up. Pt very eager to leave and get something to eat and go home. Placed call to Dr Hebert Soho office and spoke with [facility] and pt is not to get Aranesp today and to call office in AM

## 2010-11-27 NOTE — Progress Notes (Signed)
Pt had lab work (Renal Function Panel) drawn at Dr Marland Mcalpine office 11/13/10

## 2010-11-28 ENCOUNTER — Ambulatory Visit: Payer: Medicare HMO | Admitting: Internal Medicine

## 2010-12-04 ENCOUNTER — Other Ambulatory Visit (HOSPITAL_COMMUNITY): Payer: Self-pay | Admitting: *Deleted

## 2010-12-10 ENCOUNTER — Encounter (HOSPITAL_COMMUNITY)
Admission: RE | Admit: 2010-12-10 | Discharge: 2010-12-10 | Disposition: A | Discharge status: Home / Self Care | Payer: Medicare HMO | Source: Ambulatory Visit | Attending: Nephrology | Admitting: Nephrology

## 2010-12-10 ENCOUNTER — Other Ambulatory Visit (HOSPITAL_COMMUNITY): Payer: Self-pay | Admitting: *Deleted

## 2010-12-10 DIAGNOSIS — N189 Chronic kidney disease, unspecified: Secondary | ICD-10-CM

## 2010-12-10 LAB — RENAL FUNCTION PANEL
Albumin: 2.9 g/dL — ABNORMAL LOW (ref 3.5–5.2)
BUN: 60 mg/dL — ABNORMAL HIGH (ref 6–23)
Calcium: 9.3 mg/dL (ref 8.4–10.5)
Chloride: 101 mEq/L (ref 96–112)
Creatinine, Ser: 3.15 mg/dL — ABNORMAL HIGH (ref 0.50–1.10)
GFR calc non Af Amer: 13 mL/min — ABNORMAL LOW (ref >90)
Phosphorus: 3.8 mg/dL (ref 2.3–4.6)

## 2010-12-10 LAB — FERRITIN: Ferritin: 408 ng/mL — ABNORMAL HIGH (ref 10–291)

## 2010-12-10 LAB — IRON AND TIBC: TIBC: 169 ug/dL — ABNORMAL LOW (ref 250–470)

## 2010-12-10 LAB — HEMOGLOBIN: Hemoglobin: 10.4 g/dL — ABNORMAL LOW (ref 12.0–15.0)

## 2010-12-10 MED ORDER — DARBEPOETIN ALFA-POLYSORBATE 500 MCG/ML IJ SOLN
40.0000 ug | INTRAMUSCULAR | Status: DC
  Administered 2010-12-10: 40 ug via SUBCUTANEOUS
  Filled 2010-12-10: qty 1

## 2010-12-17 MED FILL — Darbepoetin Alfa-Polysorbate 80 Soln Inj 40 MCG/0.4ML: INTRAMUSCULAR | Qty: 0.4 | Status: AC

## 2010-12-24 ENCOUNTER — Encounter (HOSPITAL_COMMUNITY): Payer: Medicare HMO

## 2010-12-31 ENCOUNTER — Other Ambulatory Visit (HOSPITAL_COMMUNITY): Payer: Self-pay | Admitting: *Deleted

## 2011-01-01 ENCOUNTER — Encounter (HOSPITAL_COMMUNITY)
Admission: RE | Admit: 2011-01-01 | Discharge: 2011-01-01 | Disposition: A | Discharge status: Home / Self Care | Payer: Medicare HMO | Source: Ambulatory Visit | Attending: Nephrology | Admitting: Nephrology

## 2011-01-01 ENCOUNTER — Other Ambulatory Visit (HOSPITAL_COMMUNITY): Payer: Self-pay | Admitting: *Deleted

## 2011-01-01 ENCOUNTER — Encounter (HOSPITAL_COMMUNITY): Payer: Self-pay

## 2011-01-01 DIAGNOSIS — D649 Anemia, unspecified: Secondary | ICD-10-CM | POA: Insufficient documentation

## 2011-01-01 LAB — RENAL FUNCTION PANEL
Albumin: 3 g/dL — ABNORMAL LOW (ref 3.5–5.2)
Chloride: 100 mEq/L (ref 96–112)
Creatinine, Ser: 3.1 mg/dL — ABNORMAL HIGH (ref 0.50–1.10)
GFR calc Af Amer: 16 mL/min — ABNORMAL LOW (ref >90)
GFR calc non Af Amer: 14 mL/min — ABNORMAL LOW (ref >90)
Sodium: 138 mEq/L (ref 135–145)

## 2011-01-01 LAB — IRON AND TIBC: TIBC: 185 ug/dL — ABNORMAL LOW (ref 250–470)

## 2011-01-01 LAB — HEMOGLOBIN: Hemoglobin: 10.6 g/dL — ABNORMAL LOW (ref 12.0–15.0)

## 2011-01-01 MED ORDER — DARBEPOETIN ALFA-POLYSORBATE 500 MCG/ML IJ SOLN
40.0000 ug | INTRAMUSCULAR | Status: DC
  Administered 2011-01-01: 40 ug via SUBCUTANEOUS
  Filled 2011-01-01: qty 1

## 2011-01-01 MED FILL — Darbepoetin Alfa-Polysorbate 80 Soln Inj 40 MCG/0.4ML: INTRAMUSCULAR | Qty: 0.4 | Status: AC

## 2011-01-01 NOTE — Discharge Instructions (Signed)
Erythropoietin This is a blood test to determine the level of erythropoietin (EPO) in your blood. Erythropoietin is produced by the kidneys. EPO is a factor in your blood which usually increases if the body is not getting enough oxygen. More EPO is produced as it attempts to stimulate the bone marrow to increase your red blood cell production. When the kidney senses it is not getting enough oxygen, it produces more EPO which tells the body to make more red blood cells (the cells in your blood which carry oxygen). PREPARATION FOR TEST No preparation or fasting is necessary. NORMAL FINDINGS 5-35 IU/L Ranges for normal findings may vary among different laboratories and hospitals. You should always check with your doctor after having lab work or other tests done to discuss the meaning of your test results and whether your values are considered within normal limits. MEANING OF TEST  Your caregiver will go over the test results with you and discuss the importance and meaning of your results, as well as treatment options and the need for additional tests if necessary. OBTAINING THE TEST RESULTS It is your responsibility to obtain your test results. Ask the lab or department performing the test when and how you will get your results. Document Released: 01/23/2004 Document Revised: 09/11/2010 Document Reviewed: 12/10/2007 [facility] Patient Information 2012 [place], [place].

## 2011-01-08 ENCOUNTER — Other Ambulatory Visit (HOSPITAL_COMMUNITY): Payer: Medicare HMO

## 2011-01-13 ENCOUNTER — Other Ambulatory Visit (HOSPITAL_COMMUNITY): Payer: Self-pay | Admitting: *Deleted

## 2011-01-15 ENCOUNTER — Encounter (HOSPITAL_COMMUNITY): Admission: RE | Admit: 2011-01-15 | Payer: Medicare HMO | Source: Ambulatory Visit

## 2011-01-15 ENCOUNTER — Other Ambulatory Visit (HOSPITAL_COMMUNITY): Payer: Self-pay | Admitting: *Deleted

## 2011-01-21 ENCOUNTER — Encounter (HOSPITAL_COMMUNITY)
Admission: RE | Admit: 2011-01-21 | Discharge: 2011-01-21 | Disposition: A | Discharge status: Home / Self Care | Payer: Medicare HMO | Source: Ambulatory Visit | Attending: Nephrology | Admitting: Nephrology

## 2011-01-21 ENCOUNTER — Encounter (HOSPITAL_COMMUNITY): Payer: Self-pay

## 2011-01-21 ENCOUNTER — Other Ambulatory Visit (HOSPITAL_COMMUNITY): Payer: Medicare HMO

## 2011-01-21 DIAGNOSIS — D649 Anemia, unspecified: Secondary | ICD-10-CM | POA: Insufficient documentation

## 2011-01-21 LAB — HEMOGLOBIN: Hemoglobin: 10.4 g/dL — ABNORMAL LOW (ref 12.0–15.0)

## 2011-01-21 MED ORDER — DARBEPOETIN ALFA-POLYSORBATE 500 MCG/ML IJ SOLN
40.0000 ug | INTRAMUSCULAR | Status: DC
  Administered 2011-01-21: 40 ug via SUBCUTANEOUS
  Filled 2011-01-21: qty 1

## 2011-01-23 ENCOUNTER — Other Ambulatory Visit (HOSPITAL_COMMUNITY): Payer: Self-pay | Admitting: *Deleted

## 2011-02-04 ENCOUNTER — Other Ambulatory Visit (HOSPITAL_COMMUNITY): Payer: Medicare HMO

## 2011-02-04 ENCOUNTER — Inpatient Hospital Stay (HOSPITAL_COMMUNITY): Admission: RE | Admit: 2011-02-04 | Payer: Medicare HMO | Source: Ambulatory Visit

## 2011-02-13 ENCOUNTER — Encounter (HOSPITAL_COMMUNITY): Admission: RE | Admit: 2011-02-13 | Payer: Medicare HMO | Source: Ambulatory Visit

## 2011-02-15 ENCOUNTER — Telehealth: Payer: Self-pay | Admitting: Oncology

## 2011-02-15 NOTE — Telephone Encounter (Signed)
aware of 04/09/11 appt  aom

## 2011-02-17 ENCOUNTER — Encounter (HOSPITAL_COMMUNITY)
Admission: RE | Admit: 2011-02-17 | Discharge: 2011-02-17 | Disposition: A | Discharge status: Home / Self Care | Payer: Medicare HMO | Source: Ambulatory Visit | Attending: Nephrology | Admitting: Nephrology

## 2011-02-17 DIAGNOSIS — D649 Anemia, unspecified: Secondary | ICD-10-CM | POA: Insufficient documentation

## 2011-02-17 MED ORDER — DARBEPOETIN ALFA-POLYSORBATE 40 MCG/0.4ML IJ SOLN
40.0000 ug | INTRAMUSCULAR | Status: DC
  Administered 2011-02-17: 40 ug via SUBCUTANEOUS
  Filled 2011-02-17: qty 0.4

## 2011-02-18 ENCOUNTER — Other Ambulatory Visit (HOSPITAL_COMMUNITY): Payer: Medicare HMO

## 2011-02-18 LAB — IRON AND TIBC
Saturation Ratios: 39 % (ref 20–55)
TIBC: 223 ug/dL — ABNORMAL LOW (ref 250–470)
UIBC: 135 ug/dL (ref 125–400)

## 2011-03-03 ENCOUNTER — Other Ambulatory Visit (HOSPITAL_COMMUNITY): Payer: Medicare HMO

## 2011-03-04 ENCOUNTER — Other Ambulatory Visit (HOSPITAL_COMMUNITY): Payer: Self-pay | Admitting: *Deleted

## 2011-03-05 ENCOUNTER — Encounter (HOSPITAL_COMMUNITY)
Admission: RE | Admit: 2011-03-05 | Discharge: 2011-03-05 | Disposition: A | Discharge status: Home / Self Care | Payer: Medicare HMO | Source: Ambulatory Visit | Attending: Nephrology | Admitting: Nephrology

## 2011-03-05 ENCOUNTER — Encounter (HOSPITAL_COMMUNITY): Payer: Self-pay

## 2011-03-05 DIAGNOSIS — D638 Anemia in other chronic diseases classified elsewhere: Secondary | ICD-10-CM | POA: Insufficient documentation

## 2011-03-05 DIAGNOSIS — N184 Chronic kidney disease, stage 4 (severe): Secondary | ICD-10-CM | POA: Insufficient documentation

## 2011-03-05 LAB — HEMOGLOBIN: Hemoglobin: 10.5 g/dL — ABNORMAL LOW (ref 12.0–15.0)

## 2011-03-05 MED ORDER — DARBEPOETIN ALFA-POLYSORBATE 40 MCG/0.4ML IJ SOLN
40.0000 ug | INTRAMUSCULAR | Status: DC
  Administered 2011-03-05: 40 ug via SUBCUTANEOUS
  Filled 2011-03-05: qty 0.4

## 2011-03-05 NOTE — Discharge Instructions (Signed)
Darbepoetin Alfa injection What is this medicine? DARBEPOETIN ALFA (dar be POE e tin AL fa) helps your body make more red blood cells. It is used to treat anemia caused by chronic kidney failure and chemotherapy. This medicine may be used for other purposes; ask your health care provider or pharmacist if you have questions. What should I tell my health care provider before I take this medicine? They need to know if you have any of these conditions: -blood clotting disorders or history of blood clots -cancer patient not on chemotherapy -cystic fibrosis -heart disease, such as angina, heart failure, or a history of a heart attack -hemoglobin level of 12 g/dL or greater -high blood pressure -low levels of folate, iron, or vitamin B12 -seizures -an unusual or allergic reaction to darbepoetin, erythropoietin, albumin, hamster proteins, latex, other medicines, foods, dyes, or preservatives -pregnant or trying to get pregnant -breast-feeding How should I use this medicine? This medicine is for injection into a vein or under the skin. It is usually given by a health care professional in a hospital or clinic setting. If you get this medicine at home, you will be taught how to prepare and give this medicine. Do not shake the solution before you withdraw a dose. Use exactly as directed. Take your medicine at regular intervals. Do not take your medicine more often than directed. It is important that you put your used needles and syringes in a special sharps container. Do not put them in a trash can. If you do not have a sharps container, call your pharmacist or healthcare provider to get one. Talk to your pediatrician regarding the use of this medicine in children. While this medicine may be used in children as young as 1 year for selected conditions, precautions do apply. Overdosage: If you think you have taken too much of this medicine contact a poison control center or emergency room at once. NOTE:  This medicine is only for you. Do not share this medicine with others. What if I miss a dose? If you miss a dose, take it as soon as you can. If it is almost time for your next dose, take only that dose. Do not take double or extra doses. What may interact with this medicine? Do not take this medicine with any of the following medications: -epoetin alfa This list may not describe all possible interactions. Give your health care provider a list of all the medicines, herbs, non-prescription drugs, or dietary supplements you use. Also tell them if you smoke, drink alcohol, or use illegal drugs. Some items may interact with your medicine. What should I watch for while using this medicine? Visit your prescriber or health care professional for regular checks on your progress and for the needed blood tests and blood pressure measurements. It is especially important for the doctor to make sure your hemoglobin level is in the desired range, to limit the risk of potential side effects and to give you the best benefit. Keep all appointments for any recommended tests. Check your blood pressure as directed. Ask your doctor what your blood pressure should be and when you should contact him or her. As your body makes more red blood cells, you may need to take iron, folic acid, or vitamin B supplements. Ask your doctor or health care provider which products are right for you. If you have kidney disease continue dietary restrictions, even though this medication can make you feel better. Talk with your doctor or health care professional about the   foods you eat and the vitamins that you take. What side effects may I notice from receiving this medicine? Side effects that you should report to your doctor or health care professional as soon as possible: -allergic reactions like skin rash, itching or hives, swelling of the face, lips, or tongue -breathing problems -changes in vision -chest pain -confusion, trouble speaking  or understanding -feeling faint or lightheaded, falls -high blood pressure -muscle aches or pains -pain, swelling, warmth in the leg -rapid weight gain -severe headaches -sudden numbness or weakness of the face, arm or leg -trouble walking, dizziness, loss of balance or coordination -seizures (convulsions) -swelling of the ankles, feet, hands -unusually weak or tired Side effects that usually do not require medical attention (report to your doctor or health care professional if they continue or are bothersome): -diarrhea -fever, chills (flu-like symptoms) -headaches -nausea, vomiting -redness, stinging, or swelling at site where injected This list may not describe all possible side effects. Call your doctor for medical advice about side effects. You may report side effects to FDA at 1-800-FDA-1088. Where should I keep my medicine? Keep out of the reach of children. Store in a refrigerator between 2 and 8 degrees C (36 and 46 degrees F). Do not freeze. Do not shake. Throw away any unused portion if using a single-dose vial. Throw away any unused medicine after the expiration date. NOTE: This sheet is a summary. It may not cover all possible information. If you have questions about this medicine, talk to your doctor, pharmacist, or health care provider.  2012, [employer]. (12/14/2007 10:23:57 AM) 

## 2011-03-19 ENCOUNTER — Other Ambulatory Visit (HOSPITAL_COMMUNITY): Payer: Self-pay

## 2011-03-19 ENCOUNTER — Other Ambulatory Visit (HOSPITAL_COMMUNITY): Payer: Medicare HMO

## 2011-03-24 ENCOUNTER — Encounter (HOSPITAL_COMMUNITY)
Admission: RE | Admit: 2011-03-24 | Discharge: 2011-03-24 | Disposition: A | Discharge status: Home / Self Care | Payer: Medicare HMO | Source: Ambulatory Visit | Attending: Nephrology | Admitting: Nephrology

## 2011-03-24 DIAGNOSIS — N189 Chronic kidney disease, unspecified: Secondary | ICD-10-CM

## 2011-03-24 DIAGNOSIS — D649 Anemia, unspecified: Secondary | ICD-10-CM | POA: Insufficient documentation

## 2011-03-24 LAB — CBC
HCT: 31.9 % — ABNORMAL LOW (ref 36.0–46.0)
Hemoglobin: 10.2 g/dL — ABNORMAL LOW (ref 12.0–15.0)
MCH: 26.8 pg (ref 26.0–34.0)
MCHC: 32 g/dL (ref 30.0–36.0)
MCV: 83.9 fL (ref 78.0–100.0)
RBC: 3.8 MIL/uL — ABNORMAL LOW (ref 3.87–5.11)

## 2011-03-24 LAB — RENAL FUNCTION PANEL
BUN: 57 mg/dL — ABNORMAL HIGH (ref 6–23)
CO2: 29 mEq/L (ref 19–32)
Calcium: 9.4 mg/dL (ref 8.4–10.5)
Chloride: 99 mEq/L (ref 96–112)
Creatinine, Ser: 3.27 mg/dL — ABNORMAL HIGH (ref 0.50–1.10)
GFR calc non Af Amer: 13 mL/min — ABNORMAL LOW (ref >90)

## 2011-03-24 LAB — FERRITIN: Ferritin: 386 ng/mL — ABNORMAL HIGH (ref 10–291)

## 2011-03-24 MED ORDER — DARBEPOETIN ALFA-POLYSORBATE 40 MCG/0.4ML IJ SOLN
40.0000 ug | INTRAMUSCULAR | Status: DC
  Administered 2011-03-24: 40 ug via SUBCUTANEOUS
  Filled 2011-03-24: qty 0.4

## 2011-03-24 NOTE — Discharge Instructions (Signed)
Call MD for problems  Return 04/07/11 at 11:30 for next appt

## 2011-04-04 ENCOUNTER — Telehealth: Payer: Self-pay | Admitting: Oncology

## 2011-04-04 NOTE — Telephone Encounter (Signed)
Pt dtr called and r/s 3/27 appt to 4/17 @ 2:30 pm.

## 2011-04-07 ENCOUNTER — Encounter (HOSPITAL_COMMUNITY): Admission: RE | Admit: 2011-04-07 | Payer: Medicare HMO | Source: Ambulatory Visit

## 2011-04-09 ENCOUNTER — Other Ambulatory Visit: Payer: Medicare HMO | Admitting: Lab

## 2011-04-09 ENCOUNTER — Encounter (HOSPITAL_COMMUNITY): Admission: RE | Admit: 2011-04-09 | Payer: Medicare HMO | Source: Ambulatory Visit

## 2011-04-09 ENCOUNTER — Ambulatory Visit: Payer: Medicare HMO | Admitting: Oncology

## 2011-04-14 ENCOUNTER — Encounter (HOSPITAL_COMMUNITY): Admission: RE | Admit: 2011-04-14 | Payer: Medicare HMO | Source: Ambulatory Visit

## 2011-04-15 ENCOUNTER — Other Ambulatory Visit (HOSPITAL_COMMUNITY): Payer: Self-pay | Admitting: *Deleted

## 2011-04-16 ENCOUNTER — Encounter (HOSPITAL_COMMUNITY)
Admission: RE | Admit: 2011-04-16 | Discharge: 2011-04-16 | Disposition: A | Discharge status: Home / Self Care | Payer: Medicare HMO | Source: Ambulatory Visit | Attending: Nephrology | Admitting: Nephrology

## 2011-04-16 VITALS — BP 165/87 | HR 54 | Temp 97.8°F | Resp 17

## 2011-04-16 DIAGNOSIS — N189 Chronic kidney disease, unspecified: Secondary | ICD-10-CM | POA: Insufficient documentation

## 2011-04-16 LAB — IRON AND TIBC
Saturation Ratios: 29 % (ref 20–55)
TIBC: 204 ug/dL — ABNORMAL LOW (ref 250–470)

## 2011-04-16 MED ORDER — DARBEPOETIN ALFA-POLYSORBATE 40 MCG/0.4ML IJ SOLN
40.0000 ug | INTRAMUSCULAR | Status: DC
  Administered 2011-04-16: 40 ug via SUBCUTANEOUS
  Filled 2011-04-16: qty 0.4

## 2011-04-28 ENCOUNTER — Encounter (HOSPITAL_COMMUNITY)
Admission: RE | Admit: 2011-04-28 | Discharge: 2011-04-28 | Disposition: A | Discharge status: Home / Self Care | Payer: Medicare HMO | Source: Ambulatory Visit | Attending: Nephrology | Admitting: Nephrology

## 2011-04-28 VITALS — BP 140/76 | HR 59 | Temp 97.8°F | Resp 20

## 2011-04-28 DIAGNOSIS — N189 Chronic kidney disease, unspecified: Secondary | ICD-10-CM

## 2011-04-28 LAB — RENAL FUNCTION PANEL
BUN: 73 mg/dL — ABNORMAL HIGH (ref 6–23)
Calcium: 9.8 mg/dL (ref 8.4–10.5)
Glucose, Bld: 121 mg/dL — ABNORMAL HIGH (ref 70–99)
Phosphorus: 4.2 mg/dL (ref 2.3–4.6)
Potassium: 3.8 mEq/L (ref 3.5–5.1)

## 2011-04-28 LAB — FERRITIN: Ferritin: 218 ng/mL (ref 10–291)

## 2011-04-28 MED ORDER — DARBEPOETIN ALFA-POLYSORBATE 40 MCG/0.4ML IJ SOLN
INTRAMUSCULAR | Status: AC
  Filled 2011-04-28: qty 0.4

## 2011-04-28 MED ORDER — DARBEPOETIN ALFA-POLYSORBATE 40 MCG/0.4ML IJ SOLN
40.0000 ug | INTRAMUSCULAR | Status: DC
  Administered 2011-04-28: 40 ug via SUBCUTANEOUS

## 2011-04-30 ENCOUNTER — Other Ambulatory Visit (HOSPITAL_BASED_OUTPATIENT_CLINIC_OR_DEPARTMENT_OTHER): Payer: Medicare HMO | Admitting: Lab

## 2011-04-30 ENCOUNTER — Telehealth: Payer: Self-pay | Admitting: Oncology

## 2011-04-30 ENCOUNTER — Ambulatory Visit (HOSPITAL_BASED_OUTPATIENT_CLINIC_OR_DEPARTMENT_OTHER): Payer: Medicare HMO | Admitting: Oncology

## 2011-04-30 VITALS — BP 185/81 | HR 57 | Temp 97.3°F | Ht 62.0 in | Wt 177.8 lb

## 2011-04-30 DIAGNOSIS — D631 Anemia in chronic kidney disease: Secondary | ICD-10-CM

## 2011-04-30 DIAGNOSIS — D729 Disorder of white blood cells, unspecified: Secondary | ICD-10-CM

## 2011-04-30 DIAGNOSIS — I129 Hypertensive chronic kidney disease with stage 1 through stage 4 chronic kidney disease, or unspecified chronic kidney disease: Secondary | ICD-10-CM

## 2011-04-30 DIAGNOSIS — N289 Disorder of kidney and ureter, unspecified: Secondary | ICD-10-CM

## 2011-04-30 DIAGNOSIS — I10 Essential (primary) hypertension: Secondary | ICD-10-CM

## 2011-04-30 DIAGNOSIS — N189 Chronic kidney disease, unspecified: Secondary | ICD-10-CM

## 2011-04-30 DIAGNOSIS — D472 Monoclonal gammopathy: Secondary | ICD-10-CM

## 2011-04-30 LAB — CBC WITH DIFFERENTIAL/PLATELET
Basophils Absolute: 0 10*3/uL (ref 0.0–0.1)
Eosinophils Absolute: 1.1 10*3/uL — ABNORMAL HIGH (ref 0.0–0.5)
HGB: 10.4 g/dL — ABNORMAL LOW (ref 11.6–15.9)
MCV: 84.8 fL (ref 79.5–101.0)
MONO#: 0.5 10*3/uL (ref 0.1–0.9)
MONO%: 7.8 % (ref 0.0–14.0)
NEUT#: 3.7 10*3/uL (ref 1.5–6.5)
RDW: 14.1 % (ref 11.2–14.5)

## 2011-04-30 NOTE — Telephone Encounter (Signed)
gv pt/relative appt schedule for oct.

## 2011-04-30 NOTE — Progress Notes (Signed)
Hematology and Oncology Follow Up Visit  Jaclyn Morgan [id number] [birth year] 78 y.o. 04/30/2011 3:10 PM  CC: Jaclyn Morgan, M.D.    Principle Diagnosis: This is a 78 year old female with monoclonal gammopathy of undetermined significance.  She has an IgG lambda with an M spike about 0.8 g/dL without any evidence of multiple myeloma diagnosed in 2011.  SECONDARY DIAGNOSIS:  Including chronic renal sufficiency due to longstanding, poorly controlled hypertension.  Current therapy: Observation and surveillance.   Interim History:  Jaclyn Morgan presents today for followup visit.  Pleasant 78 year old female with history of longstanding hypertension that I have been following for possible plasma cell disorder.  As mentioned, I have did not really see any evidence to suggest end-organ damage.  She does have renal insufficiency.  I felt that this is probably related to her longstanding hypertension.  Since the last time I saw her she has done a really good job of controlling her blood pressure.  She is not reporting any particular symptomatology.  Is not reporting any headaches, not reporting any blurry vision.  Has not had any pathological fractures.  Did not report any major changes in her performance status or activity level.  She had not had any pathological bony fractures and not had any hospitalization and not had any illnesses.  Medications: I have reviewed the patient's current medications. Current outpatient prescriptions:aspirin (BAYER ASPIRIN) 325 MG tablet, Take 325 mg by mouth daily.  , Disp: , Rfl: ;  captopril (CAPOTEN) 50 MG tablet, Take 50 mg by mouth 2 (two) times daily.  , Disp: , Rfl: ;  cloNIDine (CATAPRES) 0.1 MG tablet, Take 0.1 mg by mouth 2 (two) times daily.  , Disp: , Rfl: ;  diazepam (VALIUM) 5 MG tablet, Take 1 tablet (5 mg total) by mouth 2 (two) times daily., Disp: 60 tablet, Rfl: 5 doxazosin (CARDURA) 2 MG tablet, Take 2 mg by mouth 2 (two) times daily.  , Disp: , Rfl: ;   furosemide (LASIX) 40 MG tablet, Take 40 mg by mouth daily.  , Disp: , Rfl: ;  labetalol (NORMODYNE) 200 MG tablet, Take 200 mg by mouth 2 (two) times daily.  , Disp: , Rfl: ;  Multiple Vitamins-Calcium (ONE-A-DAY WOMENS PO), Take by mouth.  , Disp: , Rfl:  rosuvastatin (CRESTOR) 10 MG tablet, Take 1 tablet (10 mg total) by mouth at bedtime., Disp: 90 tablet, Rfl: 3  Allergies: No Known Allergies  Past Medical History, Surgical history, Social history, and Family History were reviewed and updated.  Review of Systems: Constitutional:  Negative for fever, chills, night sweats, anorexia, weight loss, pain. Cardiovascular: no chest pain or dyspnea on exertion Respiratory: negative Neurological: negative Dermatological: negative ENT: negative Skin: Negative. Gastrointestinal: no abdominal pain, change in bowel habits, or black or bloody stools Genito-Urinary: negative Hematological and Lymphatic: negative Breast: negative Musculoskeletal: negative Remaining ROS negative. Physical Exam: Blood pressure 185/81, pulse 57, temperature 97.3 F (36.3 C), temperature source Oral, height 5\' 2"  (1.575 m), weight 177 lb 12.8 oz (80.65 kg). ECOG: 1 General appearance: alert Head: Normocephalic, without obvious abnormality, atraumatic Neck: no adenopathy, no carotid bruit, no JVD, supple, symmetrical, trachea midline and thyroid not enlarged, symmetric, no tenderness/mass/nodules Lymph nodes: Cervical, supraclavicular, and axillary nodes normal. Heart:regular rate and rhythm, S1, S2 normal, no murmur, click, rub or gallop Lung:chest clear, no wheezing, rales, normal symmetric air entry Abdomin: soft, non-tender, without masses or organomegaly EXT:no erythema, induration, or nodules   Lab Results: Lab Results  Component  Value Date   WBC 6.4 04/30/2011   HGB 10.4* 04/30/2011   HCT 32.3* 04/30/2011   MCV 84.8 04/30/2011   PLT 176 04/30/2011     Chemistry      Component Value Date/Time   NA 140  04/28/2011 1057   K 3.8 04/28/2011 1057   CL 101 04/28/2011 1057   CO2 25 04/28/2011 1057   BUN 73* 04/28/2011 1057   CREATININE 3.54* 04/28/2011 1057      Component Value Date/Time   CALCIUM 9.8 04/28/2011 1057   ALKPHOS 60 10/09/2010 1016   ALKPHOS 60 10/09/2010 1016   ALKPHOS 60 10/09/2010 1016   ALKPHOS 60 10/09/2010 1016   ALKPHOS 60 10/09/2010 1016   AST 45* 10/09/2010 1016   AST 45* 10/09/2010 1016   AST 45* 10/09/2010 1016   AST 45* 10/09/2010 1016   AST 45* 10/09/2010 1016   ALT 28 10/09/2010 1016   ALT 28 10/09/2010 1016   ALT 28 10/09/2010 1016   ALT 28 10/09/2010 1016   ALT 28 10/09/2010 1016   BILITOT 0.4 10/09/2010 1016   BILITOT 0.4 10/09/2010 1016   BILITOT 0.4 10/09/2010 1016   BILITOT 0.4 10/09/2010 1016   BILITOT 0.4 10/09/2010 1016       Impression and Plan:  A 78 year old female with the following issues:  1. Monoclonal gammopathy of undetermined significance, IgG lambda subtype.  Her M spike is less than 1g/dL and representing less likely the multiple myeloma.  We will continue active surveillance at this point and have her followup in 6 months time.  Certainly, if she develops any end-organ damage or worsening cytopenias then we will restage her with a bone marrow biopsy and a skeletal survey at this time.  I see no reason for that.    2. Renal sufficiency:  Again it looks like it is improving as her blood pressure is under better control.  Continue to follow with Dr. Hyman Morgan.     Jaclyn Hose, MD 4/17/20133:10 PM

## 2011-05-02 LAB — SPEP & IFE WITH QIG
Gamma Globulin: 17.3 % (ref 11.1–18.8)
IgA: 110 mg/dL (ref 69–380)
IgG (Immunoglobin G), Serum: 1270 mg/dL (ref 690–1700)
IgM, Serum: 20 mg/dL — ABNORMAL LOW (ref 52–322)
M-Spike, %: 0.52 g/dL
Total Protein, Serum Electrophoresis: 6 g/dL (ref 6.0–8.3)

## 2011-05-02 LAB — COMPREHENSIVE METABOLIC PANEL
ALT: 18 U/L (ref 0–35)
AST: 36 U/L (ref 0–37)
Alkaline Phosphatase: 54 U/L (ref 39–117)
BUN: 64 mg/dL — ABNORMAL HIGH (ref 6–23)
Creatinine, Ser: 3.4 mg/dL — ABNORMAL HIGH (ref 0.50–1.10)
Sodium: 141 mEq/L (ref 135–145)

## 2011-05-02 LAB — KAPPA/LAMBDA LIGHT CHAINS: Kappa:Lambda Ratio: 1.44 (ref 0.26–1.65)

## 2011-05-12 ENCOUNTER — Encounter (HOSPITAL_COMMUNITY)
Admission: RE | Admit: 2011-05-12 | Discharge: 2011-05-12 | Disposition: A | Discharge status: Home / Self Care | Payer: Medicare HMO | Source: Ambulatory Visit | Attending: Nephrology | Admitting: Nephrology

## 2011-05-12 VITALS — BP 157/82 | HR 56 | Temp 97.1°F

## 2011-05-12 DIAGNOSIS — N189 Chronic kidney disease, unspecified: Secondary | ICD-10-CM

## 2011-05-12 LAB — IRON AND TIBC: Saturation Ratios: 50 % (ref 20–55)

## 2011-05-12 MED ORDER — DARBEPOETIN ALFA-POLYSORBATE 40 MCG/0.4ML IJ SOLN
INTRAMUSCULAR | Status: AC
  Filled 2011-05-12: qty 0.4

## 2011-05-12 MED ORDER — DARBEPOETIN ALFA-POLYSORBATE 40 MCG/0.4ML IJ SOLN
40.0000 ug | INTRAMUSCULAR | Status: DC
  Administered 2011-05-12: 40 ug via SUBCUTANEOUS

## 2011-05-26 ENCOUNTER — Encounter (HOSPITAL_COMMUNITY)
Admission: RE | Admit: 2011-05-26 | Discharge: 2011-05-26 | Disposition: A | Discharge status: Home / Self Care | Payer: Medicare HMO | Source: Ambulatory Visit | Attending: Nephrology | Admitting: Nephrology

## 2011-05-26 VITALS — BP 158/56 | HR 60 | Temp 97.9°F | Resp 18

## 2011-05-26 DIAGNOSIS — N189 Chronic kidney disease, unspecified: Secondary | ICD-10-CM

## 2011-05-26 LAB — RENAL FUNCTION PANEL
BUN: 81 mg/dL — ABNORMAL HIGH (ref 6–23)
CO2: 28 mEq/L (ref 19–32)
Chloride: 95 mEq/L — ABNORMAL LOW (ref 96–112)
GFR calc Af Amer: 10 mL/min — ABNORMAL LOW (ref >90)
Glucose, Bld: 102 mg/dL — ABNORMAL HIGH (ref 70–99)
Potassium: 3.7 mEq/L (ref 3.5–5.1)
Sodium: 136 mEq/L (ref 135–145)

## 2011-05-26 LAB — FERRITIN: Ferritin: 241 ng/mL (ref 10–291)

## 2011-05-26 MED ORDER — DARBEPOETIN ALFA-POLYSORBATE 40 MCG/0.4ML IJ SOLN
40.0000 ug | INTRAMUSCULAR | Status: DC
  Administered 2011-05-26: 40 ug via SUBCUTANEOUS
  Filled 2011-05-26: qty 0.4

## 2011-06-02 LAB — POCT HEMOGLOBIN-HEMACUE: Hemoglobin: 11.2 g/dL — ABNORMAL LOW (ref 12.0–15.0)

## 2011-06-10 ENCOUNTER — Other Ambulatory Visit (HOSPITAL_COMMUNITY): Payer: Self-pay | Admitting: *Deleted

## 2011-06-11 ENCOUNTER — Encounter (HOSPITAL_COMMUNITY)
Admission: RE | Admit: 2011-06-11 | Discharge: 2011-06-11 | Disposition: A | Discharge status: Home / Self Care | Payer: Medicare HMO | Source: Ambulatory Visit | Attending: Nephrology | Admitting: Nephrology

## 2011-06-11 VITALS — BP 149/79 | HR 62 | Temp 98.0°F | Resp 18

## 2011-06-11 DIAGNOSIS — N189 Chronic kidney disease, unspecified: Secondary | ICD-10-CM

## 2011-06-11 LAB — IRON AND TIBC
Saturation Ratios: 32 % (ref 20–55)
TIBC: 187 ug/dL — ABNORMAL LOW (ref 250–470)

## 2011-06-11 MED ORDER — DARBEPOETIN ALFA-POLYSORBATE 40 MCG/0.4ML IJ SOLN
40.0000 ug | INTRAMUSCULAR | Status: DC
  Administered 2011-06-11: 40 ug via SUBCUTANEOUS
  Filled 2011-06-11: qty 0.4

## 2011-06-23 ENCOUNTER — Encounter (HOSPITAL_COMMUNITY): Payer: Medicare HMO

## 2011-06-30 ENCOUNTER — Encounter (HOSPITAL_COMMUNITY)
Admission: RE | Admit: 2011-06-30 | Discharge: 2011-06-30 | Disposition: A | Discharge status: Home / Self Care | Payer: Medicare HMO | Source: Ambulatory Visit | Attending: Nephrology | Admitting: Nephrology

## 2011-06-30 VITALS — BP 141/73 | HR 56 | Temp 98.1°F | Resp 20

## 2011-06-30 DIAGNOSIS — N189 Chronic kidney disease, unspecified: Secondary | ICD-10-CM

## 2011-06-30 LAB — IRON AND TIBC
Iron: 54 ug/dL (ref 42–135)
Saturation Ratios: 29 % (ref 20–55)
TIBC: 189 ug/dL — ABNORMAL LOW (ref 250–470)
UIBC: 135 ug/dL (ref 125–400)

## 2011-06-30 LAB — RENAL FUNCTION PANEL
BUN: 68 mg/dL — ABNORMAL HIGH (ref 6–23)
CO2: 28 mEq/L (ref 19–32)
Calcium: 9.3 mg/dL (ref 8.4–10.5)
GFR calc Af Amer: 13 mL/min — ABNORMAL LOW (ref >90)
Glucose, Bld: 103 mg/dL — ABNORMAL HIGH (ref 70–99)
Phosphorus: 3.7 mg/dL (ref 2.3–4.6)

## 2011-06-30 LAB — FERRITIN: Ferritin: 288 ng/mL (ref 10–291)

## 2011-06-30 MED ORDER — DARBEPOETIN ALFA-POLYSORBATE 40 MCG/0.4ML IJ SOLN
40.0000 ug | INTRAMUSCULAR | Status: DC
  Administered 2011-06-30: 40 ug via SUBCUTANEOUS
  Filled 2011-06-30: qty 0.4

## 2011-07-21 ENCOUNTER — Encounter (HOSPITAL_COMMUNITY)
Admission: RE | Admit: 2011-07-21 | Discharge: 2011-07-21 | Disposition: A | Discharge status: Home / Self Care | Payer: Medicare HMO | Source: Ambulatory Visit | Attending: Nephrology | Admitting: Nephrology

## 2011-07-21 DIAGNOSIS — N189 Chronic kidney disease, unspecified: Secondary | ICD-10-CM | POA: Insufficient documentation

## 2011-07-21 MED ORDER — DARBEPOETIN ALFA-POLYSORBATE 40 MCG/0.4ML IJ SOLN
40.0000 ug | INTRAMUSCULAR | Status: DC
  Administered 2011-07-21: 40 ug via SUBCUTANEOUS
  Filled 2011-07-21: qty 0.4

## 2011-07-23 ENCOUNTER — Other Ambulatory Visit: Payer: Self-pay | Admitting: Internal Medicine

## 2011-08-04 ENCOUNTER — Encounter (HOSPITAL_COMMUNITY)
Admission: RE | Admit: 2011-08-04 | Discharge: 2011-08-04 | Disposition: A | Discharge status: Home / Self Care | Payer: Medicare HMO | Source: Ambulatory Visit | Attending: Nephrology | Admitting: Nephrology

## 2011-08-04 VITALS — BP 156/79 | HR 53 | Temp 98.1°F | Resp 18

## 2011-08-04 DIAGNOSIS — N189 Chronic kidney disease, unspecified: Secondary | ICD-10-CM

## 2011-08-04 LAB — RENAL FUNCTION PANEL
BUN: 60 mg/dL — ABNORMAL HIGH (ref 6–23)
CO2: 27 mEq/L (ref 19–32)
Calcium: 9.6 mg/dL (ref 8.4–10.5)
GFR calc Af Amer: 14 mL/min — ABNORMAL LOW (ref >90)
Glucose, Bld: 109 mg/dL — ABNORMAL HIGH (ref 70–99)
Phosphorus: 3.6 mg/dL (ref 2.3–4.6)

## 2011-08-04 LAB — IRON AND TIBC
Saturation Ratios: 28 % (ref 20–55)
UIBC: 136 ug/dL (ref 125–400)

## 2011-08-04 LAB — FERRITIN: Ferritin: 292 ng/mL — ABNORMAL HIGH (ref 10–291)

## 2011-08-04 MED ORDER — DARBEPOETIN ALFA-POLYSORBATE 40 MCG/0.4ML IJ SOLN
40.0000 ug | INTRAMUSCULAR | Status: DC
  Administered 2011-08-04: 40 ug via SUBCUTANEOUS
  Filled 2011-08-04: qty 0.4

## 2011-08-05 ENCOUNTER — Encounter (HOSPITAL_COMMUNITY): Payer: Medicare HMO

## 2011-08-22 ENCOUNTER — Other Ambulatory Visit (HOSPITAL_COMMUNITY): Payer: Self-pay | Admitting: *Deleted

## 2011-08-25 ENCOUNTER — Inpatient Hospital Stay (HOSPITAL_COMMUNITY): Admission: RE | Admit: 2011-08-25 | Payer: Medicare HMO | Source: Ambulatory Visit

## 2011-08-28 ENCOUNTER — Encounter (HOSPITAL_COMMUNITY)
Admission: RE | Admit: 2011-08-28 | Discharge: 2011-08-28 | Disposition: A | Discharge status: Home / Self Care | Payer: Medicare HMO | Source: Ambulatory Visit | Attending: Nephrology | Admitting: Nephrology

## 2011-08-28 DIAGNOSIS — N189 Chronic kidney disease, unspecified: Secondary | ICD-10-CM | POA: Insufficient documentation

## 2011-08-28 LAB — POCT HEMOGLOBIN-HEMACUE: Hemoglobin: 8.5 g/dL — ABNORMAL LOW (ref 12.0–15.0)

## 2011-08-28 MED ORDER — DARBEPOETIN ALFA-POLYSORBATE 40 MCG/0.4ML IJ SOLN
40.0000 ug | INTRAMUSCULAR | Status: DC
Start: 2011-08-28 — End: 2011-08-29
  Administered 2011-08-28: 40 ug via SUBCUTANEOUS
  Filled 2011-08-28: qty 0.4

## 2011-09-08 ENCOUNTER — Other Ambulatory Visit (HOSPITAL_COMMUNITY): Payer: Self-pay | Admitting: *Deleted

## 2011-09-09 ENCOUNTER — Encounter (HOSPITAL_COMMUNITY)
Admission: RE | Admit: 2011-09-09 | Discharge: 2011-09-09 | Disposition: A | Discharge status: Home / Self Care | Payer: Medicare HMO | Source: Ambulatory Visit | Attending: Nephrology | Admitting: Nephrology

## 2011-09-09 VITALS — BP 146/81 | HR 51 | Temp 97.0°F | Resp 18

## 2011-09-09 DIAGNOSIS — N189 Chronic kidney disease, unspecified: Secondary | ICD-10-CM

## 2011-09-09 LAB — RENAL FUNCTION PANEL
Albumin: 3.1 g/dL — ABNORMAL LOW (ref 3.5–5.2)
CO2: 31 mEq/L (ref 19–32)
Chloride: 96 mEq/L (ref 96–112)
Creatinine, Ser: 4.18 mg/dL — ABNORMAL HIGH (ref 0.50–1.10)
GFR calc Af Amer: 11 mL/min — ABNORMAL LOW (ref >90)
GFR calc non Af Amer: 9 mL/min — ABNORMAL LOW (ref >90)
Potassium: 4.5 mEq/L (ref 3.5–5.1)
Sodium: 137 mEq/L (ref 135–145)

## 2011-09-09 LAB — IRON AND TIBC
Iron: 51 ug/dL (ref 42–135)
Saturation Ratios: 23 % (ref 20–55)
TIBC: 218 ug/dL — ABNORMAL LOW (ref 250–470)

## 2011-09-09 MED ORDER — DARBEPOETIN ALFA-POLYSORBATE 40 MCG/0.4ML IJ SOLN
40.0000 ug | INTRAMUSCULAR | Status: DC
  Administered 2011-09-09: 40 ug via SUBCUTANEOUS
  Filled 2011-09-09: qty 0.4

## 2011-09-16 ENCOUNTER — Encounter (HOSPITAL_COMMUNITY): Payer: Medicare HMO

## 2011-09-18 ENCOUNTER — Ambulatory Visit (INDEPENDENT_AMBULATORY_CARE_PROVIDER_SITE_OTHER)
Admission: RE | Admit: 2011-09-18 | Discharge: 2011-09-18 | Disposition: A | Discharge status: Home / Self Care | Payer: Medicare HMO | Source: Ambulatory Visit | Attending: Internal Medicine | Admitting: Internal Medicine

## 2011-09-18 ENCOUNTER — Ambulatory Visit (INDEPENDENT_AMBULATORY_CARE_PROVIDER_SITE_OTHER): Payer: Medicare HMO | Admitting: Internal Medicine

## 2011-09-18 ENCOUNTER — Encounter: Payer: Self-pay | Admitting: Internal Medicine

## 2011-09-18 VITALS — BP 140/88 | HR 100 | Temp 97.9°F | Resp 16 | Wt 167.8 lb

## 2011-09-18 DIAGNOSIS — R05 Cough: Secondary | ICD-10-CM

## 2011-09-18 DIAGNOSIS — I10 Essential (primary) hypertension: Secondary | ICD-10-CM

## 2011-09-18 DIAGNOSIS — J209 Acute bronchitis, unspecified: Secondary | ICD-10-CM

## 2011-09-18 MED ORDER — PROMETHAZINE-DM 6.25-15 MG/5ML PO SYRP: 5.0000 mL | ORAL_SOLUTION | Freq: Four times a day (QID) | ORAL | Status: AC | PRN

## 2011-09-18 MED ORDER — AZITHROMYCIN 500 MG PO TABS: 500.0000 mg | ORAL_TABLET | Freq: Every day | ORAL | Status: AC

## 2011-09-18 NOTE — Assessment & Plan Note (Signed)
I will check her CXR today to look for pna, mass, edema. If the cough persists then she will need to stop taking captopril.

## 2011-09-18 NOTE — Assessment & Plan Note (Signed)
She has adequate BP control 

## 2011-09-18 NOTE — Patient Instructions (Signed)

## 2011-09-18 NOTE — Progress Notes (Signed)
  Subjective:    Patient ID: Jaclyn Morgan, female    DOB: 20-Feb-1932, 78 y.o.   MRN: [id number]  Cough This is a new problem. The current episode started in the past 7 days. The problem has been unchanged. The problem occurs every few hours. The cough is productive of purulent sputum. Associated symptoms include a sore throat. Pertinent negatives include no chest pain, chills, ear congestion, ear pain, fever, headaches, heartburn, hemoptysis, myalgias, nasal congestion, postnasal drip, rash, rhinorrhea, shortness of breath, sweats, weight loss or wheezing. She has tried nothing for the symptoms. The treatment provided no relief.      Review of Systems  Constitutional: Negative for fever, chills, weight loss, diaphoresis, activity change, appetite change and fatigue.  HENT: Positive for sore throat. Negative for ear pain, rhinorrhea and postnasal drip.   Eyes: Negative.   Respiratory: Positive for cough. Negative for apnea, hemoptysis, choking, shortness of breath, wheezing and stridor.   Cardiovascular: Negative for chest pain, palpitations and leg swelling.  Gastrointestinal: Negative.  Negative for heartburn.  Genitourinary: Negative.   Musculoskeletal: Negative.  Negative for myalgias.  Skin: Negative for color change, pallor, rash and wound.  Neurological: Negative.  Negative for headaches.  Hematological: Negative for adenopathy. Does not bruise/bleed easily.  Psychiatric/Behavioral: Negative.        Objective:   Physical Exam  Vitals reviewed. Constitutional: She is oriented to person, place, and time. She appears well-developed and well-nourished.  Non-toxic appearance. She does not have a sickly appearance. She does not appear ill. No distress.  HENT:  Head: Normocephalic and atraumatic.  Mouth/Throat: Oropharynx is clear and moist. No oropharyngeal exudate.  Eyes: Conjunctivae are normal. Right eye exhibits no discharge. Left eye exhibits no discharge. No scleral icterus.    Neck: Normal range of motion. Neck supple. No JVD present. No tracheal deviation present. No thyromegaly present.  Cardiovascular: Normal rate, regular rhythm, normal heart sounds and intact distal pulses.  Exam reveals no gallop and no friction rub.   No murmur heard. Pulmonary/Chest: Effort normal and breath sounds normal. No stridor. No respiratory distress. She has no wheezes. She has no rales. She exhibits no tenderness.  Abdominal: Soft. Bowel sounds are normal. She exhibits no distension and no mass. There is no tenderness. There is no rebound and no guarding.  Musculoskeletal: Normal range of motion. She exhibits no edema and no tenderness.  Lymphadenopathy:    She has no cervical adenopathy.  Neurological: She is oriented to person, place, and time.  Skin: Skin is warm and dry. No rash noted. She is not diaphoretic. No erythema. No pallor.  Psychiatric: She has a normal mood and affect. Judgment and thought content normal. Her mood appears not anxious. Her affect is not angry, not blunt, not labile and not inappropriate. Her speech is delayed and tangential. Her speech is not rapid and/or pressured. She is slowed. She is not agitated, not aggressive, is not hyperactive, not withdrawn, not actively hallucinating and not combative. Cognition and memory are impaired. She does not express impulsivity or inappropriate judgment. She does not exhibit a depressed mood. She is communicative. She exhibits abnormal recent memory and abnormal remote memory. She is inattentive.          Assessment & Plan:

## 2011-09-18 NOTE — Assessment & Plan Note (Signed)
Will treat the infection with a Zpak and will offer a cough suppressant as well

## 2011-09-22 ENCOUNTER — Telehealth: Payer: Self-pay

## 2011-09-22 NOTE — Telephone Encounter (Signed)
[facility] Triage Call Report Triage Record Num: [id number] Operator: Tomasita Crumble Patient Name: Kristell Wooding Call Date & Time: 09/20/2011 6:52:10PM Patient Phone: [phone] PCP: Sanda Linger Patient Gender: Female PCP Fax : Patient DOB: [birth year] Practice Name: [facility] Reason for Call: Caller: Carline/Other; PCP: Sanda Linger (Adults only); CB#: [phone]; Call regarding Cough/Congestion; Afebrile/tactile. Onset sx 09/15/11. Started meds on 09/18/11. Emergent sx ruled out. Reports sx exacerbated by exposure to cold air and relievedby exposure to warmer air. Emergent sx ruled out. Home care and parameteres for callback given; follow up with provider if not improved by 09/22/11. caller voiced understanding. Protocol(s) Used: Cough - Adult Recommended Outcome per Protocol: See Provider within 2 Weeks Reason for Outcome: Symptoms brought on by exposure to cold air AND relieved by exposure to warmer air AND not previously evaluated Care Advice: ~ 09/20/2011 7:06:09PM Page 1 of 1 [id number]

## 2011-09-22 NOTE — Telephone Encounter (Signed)
Caller: [name]; Patient Name: Jaclyn Morgan; PCP: Sanda Linger (Adults only); Best Callback Phone Number: [phone]; Reason for call: Cough/Congestion. Was in office 09/18/11 and was diagnosed with Bronchitis. Placed on Zithromax (3 day course) and Promethazine Cough Syrup. States that patient's cough is better. "She is nowhere like she was." States that patient took her second dose of Zithromax on an empty stomach and vomited it back up. Is wondering why patient is still coughing and coughing up a lot of white mucous. Afebrile. Wants to know if patient needs that second pill that she vomited. All emergent symptoms per Cough - Adult protocol ruled out. Home care advice given. Advised that I would send a note to office regarding questions, but for her to follow up with them tomorrow, 09/23/11 during busines hours. Should patient be worried that she did not retain second of three pills prescribed for Bronchitis. Also states that patient is almost out of cough syrup.  OFFICE PLEASE FOLLOW UP WITH PATIENT REGARDING THIS.

## 2011-09-23 ENCOUNTER — Encounter (HOSPITAL_COMMUNITY)
Admission: RE | Admit: 2011-09-23 | Discharge: 2011-09-23 | Disposition: A | Discharge status: Home / Self Care | Payer: Medicare HMO | Source: Ambulatory Visit | Attending: Nephrology | Admitting: Nephrology

## 2011-09-23 ENCOUNTER — Telehealth: Payer: Self-pay | Admitting: Internal Medicine

## 2011-09-23 DIAGNOSIS — N189 Chronic kidney disease, unspecified: Secondary | ICD-10-CM | POA: Insufficient documentation

## 2011-09-23 MED ORDER — DARBEPOETIN ALFA-POLYSORBATE 40 MCG/0.4ML IJ SOLN
40.0000 ug | INTRAMUSCULAR | Status: DC
Start: 2011-09-23 — End: 2011-09-24
  Administered 2011-09-23: 40 ug via SUBCUTANEOUS
  Filled 2011-09-23: qty 0.4

## 2011-09-23 NOTE — Telephone Encounter (Signed)
Caller: /Patient; Patient Name: Jaclyn Morgan; PCP: Sanda Linger (Adults only); Best Callback Phone Number: [phone].  Daughter returning call from office.  States had called 09/22/11 regarding missing a dose of zithromax but missed a call from office staff AM 09/23/11.  Per Epic, Dr. Yetta Barre had stated "It is ok that she missed the dose of zithromax as long a she continues to feel better, she should try to stop the cough med."  Family advised; no further questions or concerns at this time.

## 2011-09-23 NOTE — Telephone Encounter (Signed)
It is ok that she missed the dose of zithromax as long a she continues to feel better, she should try to stop the cough med

## 2011-09-23 NOTE — Telephone Encounter (Signed)
Left message on machine for pt to return my call  

## 2011-09-24 NOTE — Telephone Encounter (Signed)
Left message with pt with MD's recommendation.

## 2011-09-30 ENCOUNTER — Encounter (HOSPITAL_COMMUNITY)
Admission: RE | Admit: 2011-09-30 | Discharge: 2011-09-30 | Disposition: A | Discharge status: Home / Self Care | Payer: Medicare HMO | Source: Ambulatory Visit | Attending: Nephrology | Admitting: Nephrology

## 2011-09-30 MED ORDER — DARBEPOETIN ALFA-POLYSORBATE 40 MCG/0.4ML IJ SOLN
INTRAMUSCULAR | Status: AC
  Administered 2011-09-30: 40 ug via SUBCUTANEOUS
  Filled 2011-09-30: qty 0.4

## 2011-10-01 LAB — POCT HEMOGLOBIN-HEMACUE: Hemoglobin: 11.1 g/dL — ABNORMAL LOW (ref 12.0–15.0)

## 2011-10-07 ENCOUNTER — Encounter (HOSPITAL_COMMUNITY)
Admission: RE | Admit: 2011-10-07 | Discharge: 2011-10-07 | Disposition: A | Discharge status: Home / Self Care | Payer: Medicare HMO | Source: Ambulatory Visit | Attending: Nephrology | Admitting: Nephrology

## 2011-10-07 VITALS — BP 147/75 | HR 55 | Temp 96.3°F | Resp 18

## 2011-10-07 DIAGNOSIS — N189 Chronic kidney disease, unspecified: Secondary | ICD-10-CM

## 2011-10-07 LAB — RENAL FUNCTION PANEL
Albumin: 3.1 g/dL — ABNORMAL LOW (ref 3.5–5.2)
Chloride: 97 mEq/L (ref 96–112)
Creatinine, Ser: 4.19 mg/dL — ABNORMAL HIGH (ref 0.50–1.10)
GFR calc non Af Amer: 9 mL/min — ABNORMAL LOW (ref >90)
Potassium: 4.3 mEq/L (ref 3.5–5.1)

## 2011-10-07 LAB — POCT HEMOGLOBIN-HEMACUE: Hemoglobin: 11 g/dL — ABNORMAL LOW (ref 12.0–15.0)

## 2011-10-07 MED ORDER — DARBEPOETIN ALFA-POLYSORBATE 40 MCG/0.4ML IJ SOLN
40.0000 ug | INTRAMUSCULAR | Status: DC
  Administered 2011-10-07: 40 ug via SUBCUTANEOUS
  Filled 2011-10-07: qty 0.4

## 2011-10-08 LAB — IRON AND TIBC: TIBC: 222 ug/dL — ABNORMAL LOW (ref 250–470)

## 2011-10-14 ENCOUNTER — Encounter (HOSPITAL_COMMUNITY)
Admission: RE | Admit: 2011-10-14 | Discharge: 2011-10-14 | Disposition: A | Discharge status: Home / Self Care | Payer: Medicare HMO | Source: Ambulatory Visit | Attending: Nephrology | Admitting: Nephrology

## 2011-10-14 DIAGNOSIS — N189 Chronic kidney disease, unspecified: Secondary | ICD-10-CM | POA: Insufficient documentation

## 2011-10-14 MED ORDER — DARBEPOETIN ALFA-POLYSORBATE 40 MCG/0.4ML IJ SOLN
INTRAMUSCULAR | Status: AC
  Administered 2011-10-14: 40 ug via SUBCUTANEOUS
  Filled 2011-10-14: qty 0.4

## 2011-10-21 ENCOUNTER — Encounter (HOSPITAL_COMMUNITY): Payer: Medicare HMO

## 2011-10-28 ENCOUNTER — Encounter (HOSPITAL_COMMUNITY)
Admission: RE | Admit: 2011-10-28 | Discharge: 2011-10-28 | Disposition: A | Discharge status: Home / Self Care | Payer: Medicare HMO | Source: Ambulatory Visit | Attending: Nephrology | Admitting: Nephrology

## 2011-10-28 LAB — POCT HEMOGLOBIN-HEMACUE: Hemoglobin: 11.6 g/dL — ABNORMAL LOW (ref 12.0–15.0)

## 2011-10-28 MED ORDER — DARBEPOETIN ALFA-POLYSORBATE 40 MCG/0.4ML IJ SOLN
INTRAMUSCULAR | Status: AC
  Filled 2011-10-28: qty 0.4

## 2011-10-28 MED ORDER — DARBEPOETIN ALFA-POLYSORBATE 40 MCG/0.4ML IJ SOLN
40.0000 ug | INTRAMUSCULAR | Status: DC
  Administered 2011-10-28: 40 ug via SUBCUTANEOUS

## 2011-10-29 ENCOUNTER — Other Ambulatory Visit: Payer: Medicare HMO | Admitting: Lab

## 2011-10-29 ENCOUNTER — Ambulatory Visit: Payer: Medicare HMO | Admitting: Oncology

## 2011-11-04 ENCOUNTER — Encounter (HOSPITAL_COMMUNITY): Payer: Medicare HMO

## 2011-11-11 ENCOUNTER — Telehealth: Payer: Self-pay | Admitting: Oncology

## 2011-11-11 ENCOUNTER — Encounter (HOSPITAL_COMMUNITY): Payer: Medicare HMO

## 2011-11-11 ENCOUNTER — Ambulatory Visit: Payer: Medicare HMO | Admitting: Oncology

## 2011-11-11 ENCOUNTER — Other Ambulatory Visit: Payer: Medicare HMO | Admitting: Lab

## 2011-11-11 NOTE — Telephone Encounter (Signed)
pt's daughter called in and need to reschedule pt's appt today.

## 2011-11-13 ENCOUNTER — Encounter (HOSPITAL_COMMUNITY)
Admission: RE | Admit: 2011-11-13 | Discharge: 2011-11-13 | Disposition: A | Discharge status: Home / Self Care | Payer: Medicare HMO | Source: Ambulatory Visit | Attending: Nephrology | Admitting: Nephrology

## 2011-11-13 VITALS — BP 174/102 | HR 56 | Temp 97.8°F | Resp 20

## 2011-11-13 DIAGNOSIS — N189 Chronic kidney disease, unspecified: Secondary | ICD-10-CM

## 2011-11-13 LAB — RENAL FUNCTION PANEL
Albumin: 3.1 g/dL — ABNORMAL LOW (ref 3.5–5.2)
GFR calc Af Amer: 10 mL/min — ABNORMAL LOW (ref >90)
GFR calc non Af Amer: 9 mL/min — ABNORMAL LOW (ref >90)
Glucose, Bld: 96 mg/dL (ref 70–99)
Phosphorus: 4.4 mg/dL (ref 2.3–4.6)
Potassium: 3.7 mEq/L (ref 3.5–5.1)
Sodium: 135 mEq/L (ref 135–145)

## 2011-11-13 LAB — IRON AND TIBC
Iron: 44 ug/dL (ref 42–135)
UIBC: 178 ug/dL (ref 125–400)

## 2011-11-13 MED ORDER — DARBEPOETIN ALFA-POLYSORBATE 40 MCG/0.4ML IJ SOLN: 40.0000 ug | INTRAMUSCULAR | Status: DC

## 2011-11-24 ENCOUNTER — Telehealth: Payer: Self-pay | Admitting: Oncology

## 2011-11-24 NOTE — Telephone Encounter (Signed)
pts daughter called in and wanted to changed tomorrows appt due to snow....done

## 2011-11-25 ENCOUNTER — Other Ambulatory Visit: Payer: Medicare HMO | Admitting: Lab

## 2011-11-25 ENCOUNTER — Ambulatory Visit: Payer: Medicare HMO | Admitting: Oncology

## 2011-11-27 ENCOUNTER — Encounter (HOSPITAL_COMMUNITY): Payer: Medicare HMO

## 2011-12-03 ENCOUNTER — Encounter (HOSPITAL_COMMUNITY)
Admission: RE | Admit: 2011-12-03 | Discharge: 2011-12-03 | Disposition: A | Discharge status: Home / Self Care | Payer: Medicare HMO | Source: Ambulatory Visit | Attending: Internal Medicine | Admitting: Internal Medicine

## 2011-12-03 DIAGNOSIS — N189 Chronic kidney disease, unspecified: Secondary | ICD-10-CM | POA: Insufficient documentation

## 2011-12-03 LAB — POCT HEMOGLOBIN-HEMACUE: Hemoglobin: 10.8 g/dL — ABNORMAL LOW (ref 12.0–15.0)

## 2011-12-03 MED ORDER — DARBEPOETIN ALFA-POLYSORBATE 40 MCG/0.4ML IJ SOLN
40.0000 ug | INTRAMUSCULAR | Status: DC
  Administered 2011-12-03: 40 ug via SUBCUTANEOUS

## 2011-12-03 MED ORDER — DARBEPOETIN ALFA-POLYSORBATE 40 MCG/0.4ML IJ SOLN
INTRAMUSCULAR | Status: AC
  Filled 2011-12-03: qty 0.4

## 2011-12-05 ENCOUNTER — Telehealth: Payer: Self-pay | Admitting: Oncology

## 2011-12-05 ENCOUNTER — Other Ambulatory Visit: Payer: Self-pay | Admitting: *Deleted

## 2011-12-05 ENCOUNTER — Ambulatory Visit (HOSPITAL_BASED_OUTPATIENT_CLINIC_OR_DEPARTMENT_OTHER): Payer: Medicare HMO | Admitting: Oncology

## 2011-12-05 ENCOUNTER — Other Ambulatory Visit (HOSPITAL_BASED_OUTPATIENT_CLINIC_OR_DEPARTMENT_OTHER): Payer: Medicare HMO | Admitting: Lab

## 2011-12-05 VITALS — BP 168/90 | HR 56 | Temp 98.2°F | Resp 18 | Ht 62.0 in | Wt 168.6 lb

## 2011-12-05 DIAGNOSIS — N289 Disorder of kidney and ureter, unspecified: Secondary | ICD-10-CM

## 2011-12-05 DIAGNOSIS — D7289 Other specified disorders of white blood cells: Secondary | ICD-10-CM

## 2011-12-05 DIAGNOSIS — D729 Disorder of white blood cells, unspecified: Secondary | ICD-10-CM

## 2011-12-05 DIAGNOSIS — D472 Monoclonal gammopathy: Secondary | ICD-10-CM

## 2011-12-05 DIAGNOSIS — D649 Anemia, unspecified: Secondary | ICD-10-CM

## 2011-12-05 LAB — COMPREHENSIVE METABOLIC PANEL (CC13)
Alkaline Phosphatase: 55 U/L (ref 40–150)
Glucose: 121 mg/dl — ABNORMAL HIGH (ref 70–99)
Sodium: 139 mEq/L (ref 136–145)
Total Bilirubin: 0.67 mg/dL (ref 0.20–1.20)
Total Protein: 6.5 g/dL (ref 6.4–8.3)

## 2011-12-05 LAB — CBC WITH DIFFERENTIAL/PLATELET
Basophils Absolute: 0 10*3/uL (ref 0.0–0.1)
Eosinophils Absolute: 0.3 10*3/uL (ref 0.0–0.5)
HCT: 32.1 % — ABNORMAL LOW (ref 34.8–46.6)
HGB: 10.5 g/dL — ABNORMAL LOW (ref 11.6–15.9)
MONO#: 0.5 10*3/uL (ref 0.1–0.9)
NEUT%: 63.5 % (ref 38.4–76.8)
Platelets: 153 10*3/uL (ref 145–400)
WBC: 5.5 10*3/uL (ref 3.9–10.3)
lymph#: 1.2 10*3/uL (ref 0.9–3.3)

## 2011-12-05 NOTE — Telephone Encounter (Signed)
appts made and printed for pt aom °

## 2011-12-05 NOTE — Progress Notes (Signed)
Hematology and Oncology Follow Up Visit  SAMRAWIT VANDENHEUVEL [id number] [birth year] 78 y.o. 12/05/2011 4:16 PM  CC: Garnetta Buddy, M.D.    Principle Diagnosis: This is a 78 year old female with monoclonal gammopathy of undetermined significance.  She has an IgG lambda with an M spike about 0.8 g/dL without any evidence of multiple myeloma diagnosed in 2011.  SECONDARY DIAGNOSIS:  Including chronic renal sufficiency due to longstanding, poorly controlled hypertension.  Current therapy: Observation and surveillance.   Interim History:  Mrs. Amaker presents today for followup visit.  Pleasant 78 year old female with history of longstanding hypertension that I have been following for possible plasma cell disorder.  As mentioned, I have did not really see any evidence to suggest end-organ damage.  She does have renal insufficiency.  I felt that this is probably related to her longstanding hypertension.  Since the last time I saw her she  is not reporting any particular symptomatology.  Is not reporting any headaches, not reporting any blurry vision.  Has not had any pathological fractures.  Did not report any major changes in her performance status or activity level.  She had not had any pathological bony fractures and not had any hospitalization and not had any illnesses.  Medications: I have reviewed the patient's current medications. Current outpatient prescriptions:aspirin (BAYER ASPIRIN) 325 MG tablet, Take 325 mg by mouth daily.  , Disp: , Rfl: ;  captopril (CAPOTEN) 50 MG tablet, Take 50 mg by mouth 2 (two) times daily.  , Disp: , Rfl: ;  cloNIDine (CATAPRES) 0.1 MG tablet, Take 0.1 mg by mouth 2 (two) times daily.  , Disp: , Rfl: ;  diazepam (VALIUM) 5 MG tablet, take 1 tablet by mouth twice a day, Disp: 60 tablet, Rfl: 5 doxazosin (CARDURA) 2 MG tablet, Take 2 mg by mouth 2 (two) times daily.  , Disp: , Rfl: ;  furosemide (LASIX) 40 MG tablet, Take 40 mg by mouth daily.  , Disp: , Rfl: ;  hydrALAZINE  (APRESOLINE) 25 MG tablet, , Disp: , Rfl: ;  isosorbide mononitrate (IMDUR) 30 MG 24 hr tablet, , Disp: , Rfl: ;  labetalol (NORMODYNE) 200 MG tablet, Take 200 mg by mouth 2 (two) times daily.  , Disp: , Rfl:  Multiple Vitamins-Calcium (ONE-A-DAY WOMENS PO), Take by mouth.  , Disp: , Rfl: ;  rosuvastatin (CRESTOR) 10 MG tablet, Take 10 mg by mouth at bedtime., Disp: , Rfl:   Allergies: No Known Allergies  Past Medical History, Surgical history, Social history, and Family History were reviewed and updated.  Review of Systems: Constitutional:  Negative for fever, chills, night sweats, anorexia, weight loss, pain. Cardiovascular: no chest pain or dyspnea on exertion Respiratory: negative Neurological: negative Dermatological: negative ENT: negative Skin: Negative. Gastrointestinal: no abdominal pain, change in bowel habits, or black or bloody stools Genito-Urinary: negative Hematological and Lymphatic: negative Breast: negative Musculoskeletal: negative Remaining ROS negative. Physical Exam: Blood pressure 168/90, pulse 56, temperature 98.2 F (36.8 C), temperature source Oral, resp. rate 18, height 5\' 2"  (1.575 m), weight 168 lb 9.6 oz (76.476 kg). ECOG: 1 General appearance: alert Head: Normocephalic, without obvious abnormality, atraumatic Neck: no adenopathy, no carotid bruit, no JVD, supple, symmetrical, trachea midline and thyroid not enlarged, symmetric, no tenderness/mass/nodules Lymph nodes: Cervical, supraclavicular, and axillary nodes normal. Heart:regular rate and rhythm, S1, S2 normal, no murmur, click, rub or gallop Lung:chest clear, no wheezing, rales, normal symmetric air entry Abdomin: soft, non-tender, without masses or organomegaly EXT:no erythema, induration, or nodules  Lab Results: Lab Results  Component Value Date   WBC 5.5 12/05/2011   HGB 10.5* 12/05/2011   HCT 32.1* 12/05/2011   MCV 85.2 12/05/2011   PLT 153 12/05/2011     Chemistry      Component  Value Date/Time   NA 135 11/13/2011 1355   K 3.7 11/13/2011 1355   CL 95* 11/13/2011 1355   CO2 29 11/13/2011 1355   BUN 88* 11/13/2011 1355   CREATININE 4.50* 11/13/2011 1355      Component Value Date/Time   CALCIUM 9.3 11/13/2011 1355   ALKPHOS 54 04/30/2011 1408   AST 36 04/30/2011 1408   ALT 18 04/30/2011 1408   BILITOT 0.5 04/30/2011 1408       Impression and Plan:  A 78 year old female with the following issues:  1. Monoclonal gammopathy of undetermined significance, IgG lambda subtype.  Her M spike is less than 1g/dL and representing less likely the multiple myeloma.  We will continue active surveillance at this point and have her followup in 6 months time.  Certainly, if she develops any end-organ damage or worsening cytopenias then we will restage her with a bone marrow biopsy and a skeletal survey at this time.  I see no reason for that.    2. Renal sufficiency:  Again it looks like it is improving as her blood pressure is under better control.  Continue to follow with Dr. Hyman Hopes. 3. Anemia: Likely due to renal failure.      Eli Hose, MD 11/22/20134:16 PM

## 2011-12-09 LAB — KAPPA/LAMBDA LIGHT CHAINS
Kappa free light chain: 10.5 mg/dL — ABNORMAL HIGH (ref 0.33–1.94)
Kappa:Lambda Ratio: 2.16 — ABNORMAL HIGH (ref 0.26–1.65)
Lambda Free Lght Chn: 4.86 mg/dL — ABNORMAL HIGH (ref 0.57–2.63)

## 2011-12-09 LAB — SPEP & IFE WITH QIG
Albumin ELP: 56.4 % (ref 55.8–66.1)
Alpha-1-Globulin: 4.8 % (ref 2.9–4.9)
Gamma Globulin: 17.4 % (ref 11.1–18.8)
IgM, Serum: 19 mg/dL — ABNORMAL LOW (ref 52–322)
Total Protein, Serum Electrophoresis: 7.2 g/dL (ref 6.0–8.3)

## 2011-12-17 ENCOUNTER — Other Ambulatory Visit (HOSPITAL_COMMUNITY): Payer: Self-pay | Admitting: *Deleted

## 2011-12-18 ENCOUNTER — Encounter (HOSPITAL_COMMUNITY)
Admission: RE | Admit: 2011-12-18 | Discharge: 2011-12-18 | Disposition: A | Discharge status: Home / Self Care | Payer: Medicare HMO | Source: Ambulatory Visit | Attending: Internal Medicine | Admitting: Internal Medicine

## 2011-12-18 VITALS — BP 160/86 | HR 55 | Resp 18

## 2011-12-18 DIAGNOSIS — N189 Chronic kidney disease, unspecified: Secondary | ICD-10-CM | POA: Insufficient documentation

## 2011-12-18 LAB — RENAL FUNCTION PANEL
BUN: 85 mg/dL — ABNORMAL HIGH (ref 6–23)
Glucose, Bld: 92 mg/dL (ref 70–99)
Phosphorus: 3.8 mg/dL (ref 2.3–4.6)
Potassium: 3.7 mEq/L (ref 3.5–5.1)

## 2011-12-18 LAB — IRON AND TIBC
Iron: 50 ug/dL (ref 42–135)
TIBC: 216 ug/dL — ABNORMAL LOW (ref 250–470)
UIBC: 166 ug/dL (ref 125–400)

## 2011-12-18 LAB — FERRITIN: Ferritin: 162 ng/mL (ref 10–291)

## 2011-12-18 MED ORDER — DARBEPOETIN ALFA-POLYSORBATE 40 MCG/0.4ML IJ SOLN
40.0000 ug | INTRAMUSCULAR | Status: DC
  Administered 2011-12-18: 40 ug via SUBCUTANEOUS

## 2011-12-18 MED ORDER — DARBEPOETIN ALFA-POLYSORBATE 40 MCG/0.4ML IJ SOLN
INTRAMUSCULAR | Status: AC
  Filled 2011-12-18: qty 0.4

## 2011-12-30 ENCOUNTER — Encounter (HOSPITAL_COMMUNITY): Payer: Medicare HMO

## 2012-01-09 ENCOUNTER — Encounter (HOSPITAL_COMMUNITY)
Admission: RE | Admit: 2012-01-09 | Discharge: 2012-01-09 | Disposition: A | Discharge status: Home / Self Care | Payer: Medicare HMO | Source: Ambulatory Visit | Attending: Nephrology | Admitting: Nephrology

## 2012-01-09 LAB — POCT HEMOGLOBIN-HEMACUE: Hemoglobin: 10.9 g/dL — ABNORMAL LOW (ref 12.0–15.0)

## 2012-01-09 MED ORDER — DARBEPOETIN ALFA-POLYSORBATE 40 MCG/0.4ML IJ SOLN
40.0000 ug | INTRAMUSCULAR | Status: DC
  Administered 2012-01-09: 40 ug via SUBCUTANEOUS

## 2012-01-09 MED ORDER — DARBEPOETIN ALFA-POLYSORBATE 40 MCG/0.4ML IJ SOLN
INTRAMUSCULAR | Status: AC
  Filled 2012-01-09: qty 0.4

## 2012-01-27 ENCOUNTER — Encounter (HOSPITAL_COMMUNITY): Payer: Medicare HMO

## 2012-01-29 ENCOUNTER — Encounter (HOSPITAL_COMMUNITY)
Admission: RE | Admit: 2012-01-29 | Discharge: 2012-01-29 | Disposition: A | Discharge status: Home / Self Care | Payer: Medicare HMO | Source: Ambulatory Visit | Attending: Internal Medicine | Admitting: Internal Medicine

## 2012-01-29 VITALS — BP 226/82 | HR 56 | Temp 97.1°F | Resp 18

## 2012-01-29 DIAGNOSIS — N189 Chronic kidney disease, unspecified: Secondary | ICD-10-CM | POA: Insufficient documentation

## 2012-01-29 MED ORDER — CLONIDINE HCL 0.1 MG PO TABS
ORAL_TABLET | ORAL | Status: AC
  Administered 2012-01-29: 0.1 mg
  Filled 2012-01-29: qty 1

## 2012-01-29 MED ORDER — DARBEPOETIN ALFA-POLYSORBATE 40 MCG/0.4ML IJ SOLN: 40.0000 ug | INTRAMUSCULAR | Status: DC

## 2012-01-30 ENCOUNTER — Encounter (HOSPITAL_COMMUNITY)
Admission: RE | Admit: 2012-01-30 | Discharge: 2012-01-30 | Disposition: A | Discharge status: Home / Self Care | Payer: Medicare HMO | Source: Ambulatory Visit | Attending: Nephrology | Admitting: Nephrology

## 2012-01-30 LAB — POCT HEMOGLOBIN-HEMACUE: Hemoglobin: 10.2 g/dL — ABNORMAL LOW (ref 12.0–15.0)

## 2012-01-30 NOTE — Progress Notes (Signed)
Called and spoke with Dr Hyman Hopes today because BP remains high today 190/83 and pt stated she took her meds at 0600 today.  Dr Hyman Hopes stated to check her hgb today but no shot and have her return on Monday and recheck her BP to see if ok to receive the shot Monday.

## 2012-02-02 ENCOUNTER — Encounter (HOSPITAL_COMMUNITY)
Admission: RE | Admit: 2012-02-02 | Discharge: 2012-02-02 | Disposition: A | Discharge status: Home / Self Care | Payer: Medicare HMO | Source: Ambulatory Visit | Attending: Nephrology | Admitting: Nephrology

## 2012-02-02 MED ORDER — DARBEPOETIN ALFA-POLYSORBATE 40 MCG/0.4ML IJ SOLN
40.0000 ug | INTRAMUSCULAR | Status: DC
  Administered 2012-02-02: 40 ug via SUBCUTANEOUS

## 2012-02-02 MED ORDER — CLONIDINE HCL 0.1 MG PO TABS: 0.2000 mg | ORAL_TABLET | Freq: Once | ORAL | Status: DC

## 2012-02-02 MED ORDER — CLONIDINE HCL 0.1 MG PO TABS
ORAL_TABLET | ORAL | Status: AC
  Administered 2012-02-02: 0.2 mg
  Filled 2012-02-02: qty 2

## 2012-02-02 MED ORDER — DARBEPOETIN ALFA-POLYSORBATE 40 MCG/0.4ML IJ SOLN
INTRAMUSCULAR | Status: AC
  Administered 2012-02-02: 40 ug via SUBCUTANEOUS
  Filled 2012-02-02: qty 0.4

## 2012-02-02 NOTE — Progress Notes (Signed)
Called and spoke with Dr. Hyman Hopes because pt's BP still too high today.  Orders given today for clonidine 0.2mg  and after waiting 30 minutes give pt her shot despite her BP.  Also tell pt the office will call her to adjust her medications.

## 2012-02-10 ENCOUNTER — Encounter (HOSPITAL_COMMUNITY): Payer: Medicare HMO

## 2012-02-11 ENCOUNTER — Encounter (HOSPITAL_COMMUNITY): Payer: Medicare HMO

## 2012-02-12 ENCOUNTER — Encounter (HOSPITAL_COMMUNITY): Payer: Medicare HMO

## 2012-02-19 ENCOUNTER — Encounter (HOSPITAL_COMMUNITY)
Admission: RE | Admit: 2012-02-19 | Discharge: 2012-02-19 | Disposition: A | Discharge status: Home / Self Care | Payer: Medicare HMO | Source: Ambulatory Visit | Attending: Internal Medicine | Admitting: Internal Medicine

## 2012-02-19 DIAGNOSIS — N189 Chronic kidney disease, unspecified: Secondary | ICD-10-CM | POA: Insufficient documentation

## 2012-02-19 MED ORDER — DARBEPOETIN ALFA-POLYSORBATE 40 MCG/0.4ML IJ SOLN
INTRAMUSCULAR | Status: AC
  Administered 2012-02-19: 40 ug via SUBCUTANEOUS
  Filled 2012-02-19: qty 0.4

## 2012-02-19 MED ORDER — DARBEPOETIN ALFA-POLYSORBATE 40 MCG/0.4ML IJ SOLN
40.0000 ug | INTRAMUSCULAR | Status: DC
  Administered 2012-02-19: 40 ug via SUBCUTANEOUS

## 2012-02-25 ENCOUNTER — Other Ambulatory Visit (HOSPITAL_COMMUNITY): Payer: Self-pay | Admitting: *Deleted

## 2012-02-26 ENCOUNTER — Encounter (HOSPITAL_COMMUNITY): Payer: Medicare HMO

## 2012-03-04 ENCOUNTER — Encounter (HOSPITAL_COMMUNITY)
Admission: RE | Admit: 2012-03-04 | Discharge: 2012-03-04 | Disposition: A | Discharge status: Home / Self Care | Payer: Medicare HMO | Source: Ambulatory Visit | Attending: Nephrology | Admitting: Nephrology

## 2012-03-04 VITALS — BP 162/80 | HR 53 | Resp 18

## 2012-03-04 DIAGNOSIS — N189 Chronic kidney disease, unspecified: Secondary | ICD-10-CM

## 2012-03-04 LAB — RENAL FUNCTION PANEL
Albumin: 3 g/dL — ABNORMAL LOW (ref 3.5–5.2)
BUN: 93 mg/dL — ABNORMAL HIGH (ref 6–23)
Calcium: 9.7 mg/dL (ref 8.4–10.5)
Glucose, Bld: 107 mg/dL — ABNORMAL HIGH (ref 70–99)
Phosphorus: 4.4 mg/dL (ref 2.3–4.6)
Potassium: 3.8 mEq/L (ref 3.5–5.1)

## 2012-03-04 LAB — POCT HEMOGLOBIN-HEMACUE: Hemoglobin: 10.4 g/dL — ABNORMAL LOW (ref 12.0–15.0)

## 2012-03-04 LAB — IRON AND TIBC: UIBC: 133 ug/dL (ref 125–400)

## 2012-03-04 LAB — FERRITIN: Ferritin: 278 ng/mL (ref 10–291)

## 2012-03-04 MED ORDER — DARBEPOETIN ALFA-POLYSORBATE 40 MCG/0.4ML IJ SOLN
40.0000 ug | INTRAMUSCULAR | Status: DC
Start: 2012-03-04 — End: 2012-03-05
  Administered 2012-03-04: 40 ug via SUBCUTANEOUS

## 2012-03-04 MED ORDER — DARBEPOETIN ALFA-POLYSORBATE 40 MCG/0.4ML IJ SOLN
INTRAMUSCULAR | Status: AC
  Administered 2012-03-04: 40 ug via SUBCUTANEOUS
  Filled 2012-03-04: qty 0.4

## 2012-03-10 ENCOUNTER — Encounter (HOSPITAL_COMMUNITY): Payer: Medicare HMO

## 2012-03-12 ENCOUNTER — Encounter (HOSPITAL_COMMUNITY): Payer: Medicare HMO

## 2012-03-18 ENCOUNTER — Encounter (HOSPITAL_COMMUNITY)
Admission: RE | Admit: 2012-03-18 | Discharge: 2012-03-18 | Disposition: A | Discharge status: Home / Self Care | Payer: Medicare HMO | Source: Ambulatory Visit | Attending: Internal Medicine | Admitting: Internal Medicine

## 2012-03-18 DIAGNOSIS — N189 Chronic kidney disease, unspecified: Secondary | ICD-10-CM | POA: Insufficient documentation

## 2012-03-18 LAB — POCT HEMOGLOBIN-HEMACUE: Hemoglobin: 10.3 g/dL — ABNORMAL LOW (ref 12.0–15.0)

## 2012-03-18 MED ORDER — DARBEPOETIN ALFA-POLYSORBATE 40 MCG/0.4ML IJ SOLN
INTRAMUSCULAR | Status: AC
  Administered 2012-03-18: 40 ug via SUBCUTANEOUS
  Filled 2012-03-18: qty 0.4

## 2012-03-18 MED ORDER — DARBEPOETIN ALFA-POLYSORBATE 40 MCG/0.4ML IJ SOLN
40.0000 ug | INTRAMUSCULAR | Status: DC
  Administered 2012-03-18: 40 ug via SUBCUTANEOUS

## 2012-03-25 ENCOUNTER — Encounter (HOSPITAL_COMMUNITY): Payer: Medicare HMO

## 2012-03-25 IMAGING — CR DG BONE SURVEY MET
9 of 10 series · 9 of 10 positions shown · non-contrast
Comparison: None.

CLINICAL DATA: History of myeloma.  Evaluate for bony lesion.

METASTATIC BONE SURVEY

[w c-spine lat]
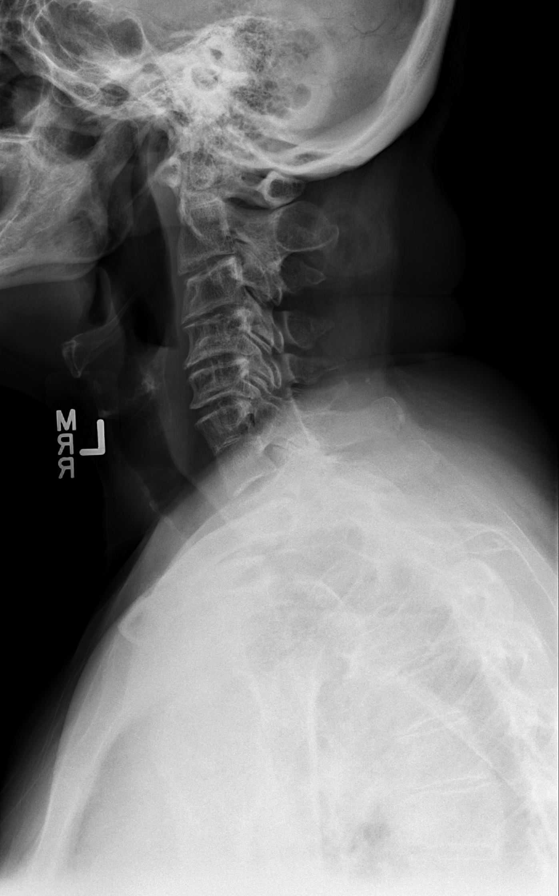

[w c-spine lat *]
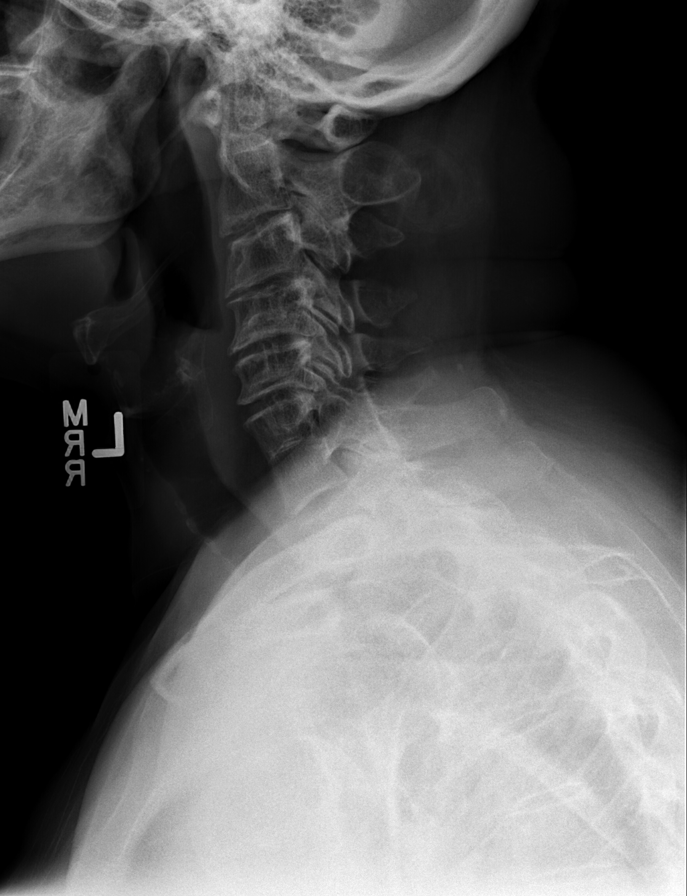

[w skull lat *]
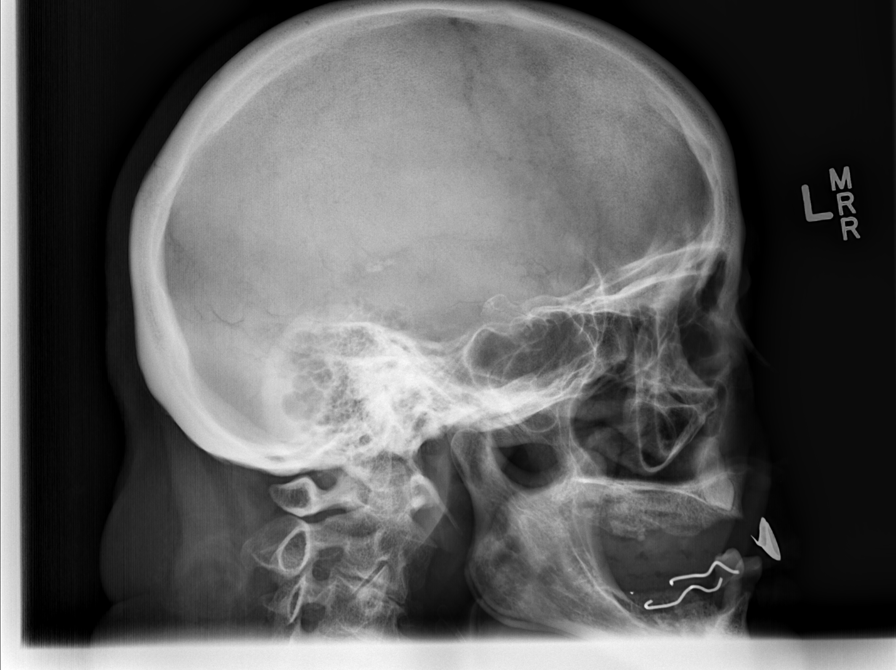

[t c-spine a.p.]
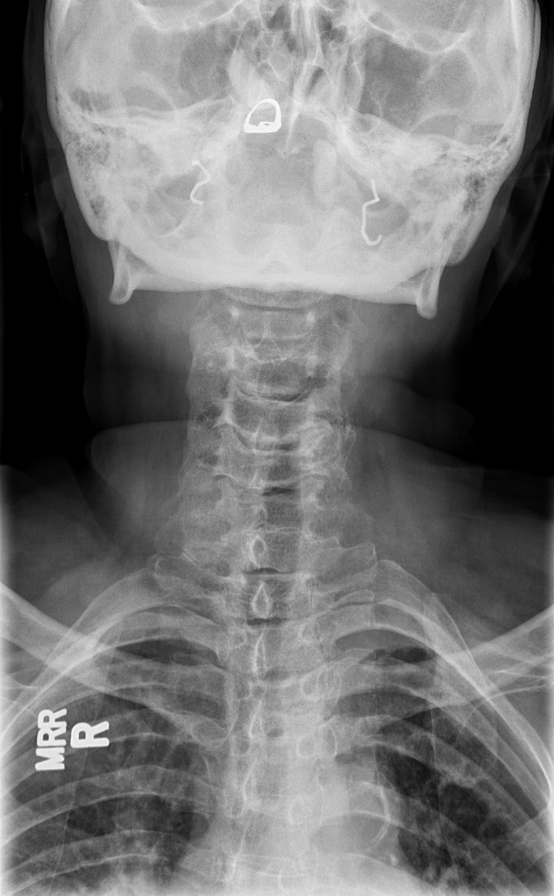

[t t-spine a.p.]
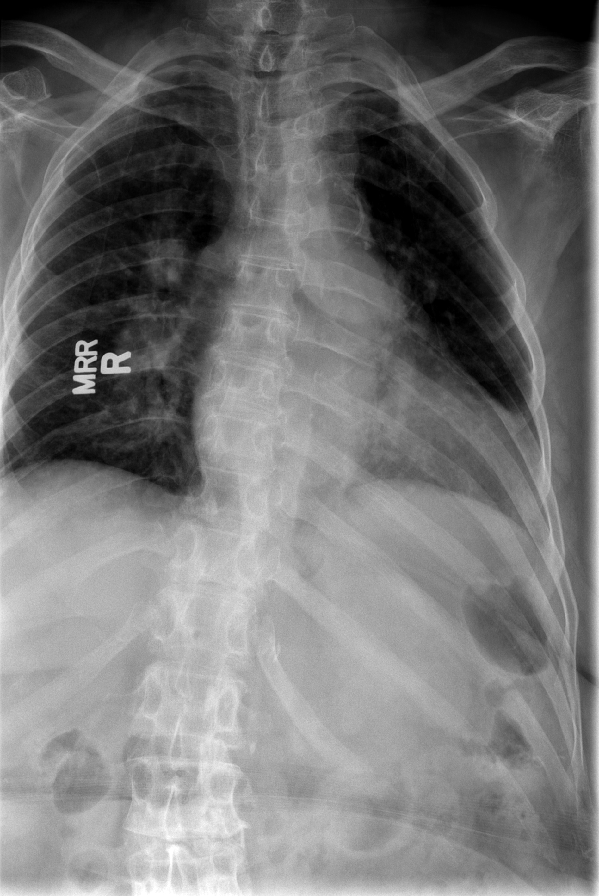

[t l-spine a.p.]
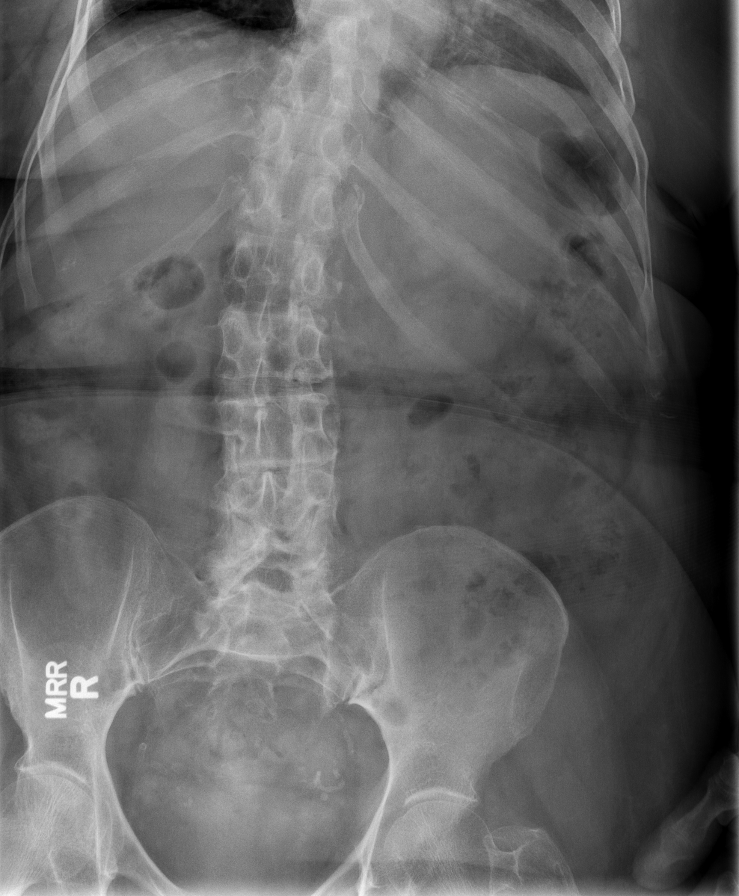

[t pelvis a.p.]
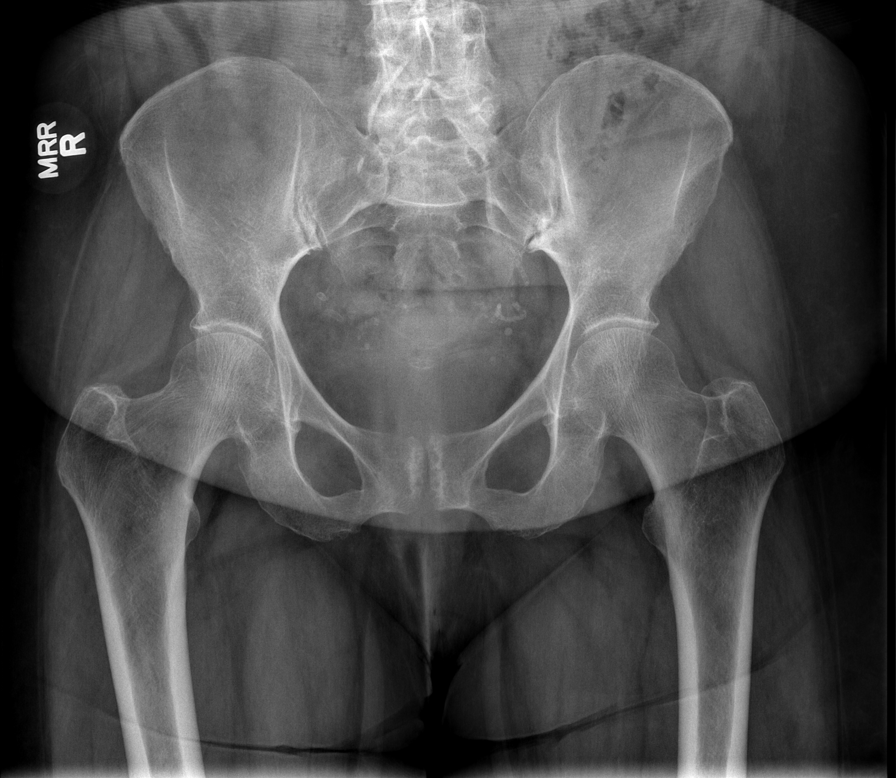

[t femur with hip  ap left]
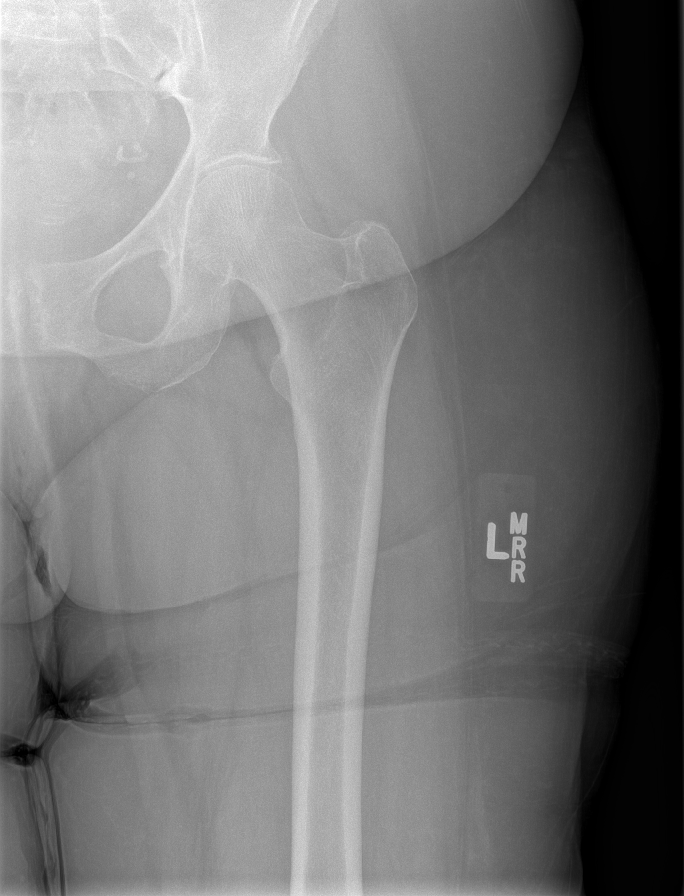

[t femur with knee ap left]
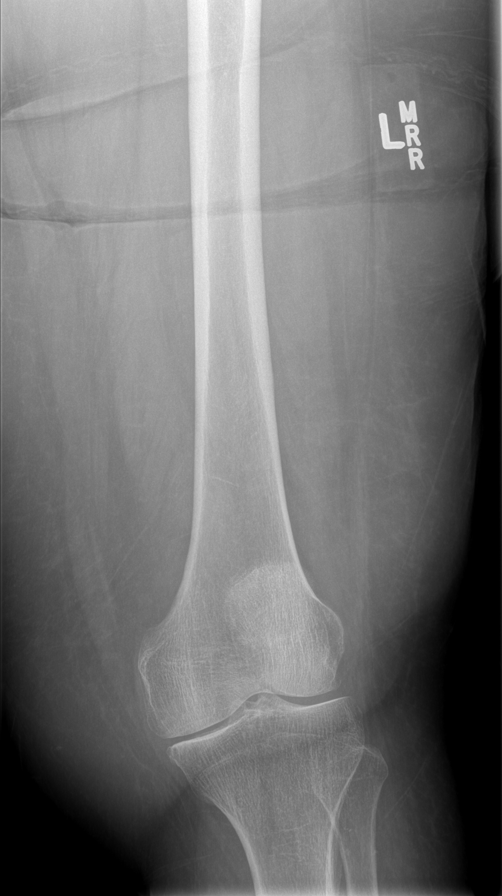

[9 of 10 positions shown; findings below may reference images not displayed]

FINDINGS: There is an overall osteopenic appearance of the bones.

Calvarium appears intact.  Sella turcica appears normal.  No lytic
lesion is evident.

There is narrowing of the intervertebral disc space at the C3-C4
level.  There is partial congenital fusion of the bodies of C6 and
C7.  There is marginal osteophyte formation representing
degenerative spondylosis.  No lytic or destructive lesion is seen
in the cervical spine. There is posterior bony spurring at the
level of C4-C5 and C5-C6.

Examination of the thoracolumbar spine demonstrates mild scoliosis.
No lytic or destructive lesion is definitely seen.  Right hilar
prominence most likely is vascular. The superior aspect of the
right hilar region has a rounded configuration.  This may be
vascular but suggests PA and lateral chest examination be obtained.
No rib lesion was demonstrated.

Nonaneurysmal aortic and common iliac calcifications are seen.
There is mild lumbar scoliosis with degenerative spondylosis.
There is grade 1 severity pseudospondylolisthesis of the body of L4
on the body of L5.  There is narrowing of intervertebral disc
spaces with vacuum phenomenon at the levels of L4-L5 and L5-S1
representing degenerative disc disease.  No lytic lesions were
evident.

The bony pelvis and appendicular skeleton appear intact with no
lytic lesions demonstrated.

There is narrowing of the medial joint space at the knee
bilaterally.  No lytic lesion is seen.
IMPRESSION: No lytic lesion is seen in the appendicular or axial skeleton.
Chronic findings are described above.

Right hilar prominence most likely is vascular. The superior aspect
of the right hilar region has a rounded configuration.  This may be
vascular but suggests PA and lateral chest examination be obtained.

## 2012-03-26 ENCOUNTER — Other Ambulatory Visit: Payer: Self-pay | Admitting: Internal Medicine

## 2012-03-29 ENCOUNTER — Telehealth: Payer: Self-pay | Admitting: Internal Medicine

## 2012-03-29 NOTE — Telephone Encounter (Signed)
This has been refilled.

## 2012-03-29 NOTE — Telephone Encounter (Signed)
Pt is aware.  

## 2012-03-29 NOTE — Telephone Encounter (Signed)
Pt called because the pharmacy told them the Valium RX was denied.  Please advise.

## 2012-04-01 ENCOUNTER — Encounter (HOSPITAL_COMMUNITY)
Admission: RE | Admit: 2012-04-01 | Discharge: 2012-04-01 | Disposition: A | Discharge status: Home / Self Care | Payer: Medicare HMO | Source: Ambulatory Visit | Attending: Nephrology | Admitting: Nephrology

## 2012-04-01 VITALS — BP 179/99 | HR 58 | Temp 97.7°F | Resp 18

## 2012-04-01 DIAGNOSIS — N189 Chronic kidney disease, unspecified: Secondary | ICD-10-CM

## 2012-04-01 LAB — RENAL FUNCTION PANEL
Albumin: 2.9 g/dL — ABNORMAL LOW (ref 3.5–5.2)
Calcium: 9.4 mg/dL (ref 8.4–10.5)
GFR calc Af Amer: 10 mL/min — ABNORMAL LOW (ref >90)
GFR calc non Af Amer: 9 mL/min — ABNORMAL LOW (ref >90)
Phosphorus: 4.2 mg/dL (ref 2.3–4.6)
Potassium: 4 mEq/L (ref 3.5–5.1)
Sodium: 138 mEq/L (ref 135–145)

## 2012-04-01 LAB — IRON AND TIBC
Iron: 40 ug/dL — ABNORMAL LOW (ref 42–135)
UIBC: 159 ug/dL (ref 125–400)

## 2012-04-01 MED ORDER — DARBEPOETIN ALFA-POLYSORBATE 40 MCG/0.4ML IJ SOLN
40.0000 ug | INTRAMUSCULAR | Status: DC
  Administered 2012-04-01: 40 ug via SUBCUTANEOUS

## 2012-04-01 MED ORDER — DARBEPOETIN ALFA-POLYSORBATE 40 MCG/0.4ML IJ SOLN
INTRAMUSCULAR | Status: AC
  Filled 2012-04-01: qty 0.4

## 2012-04-07 ENCOUNTER — Encounter (HOSPITAL_COMMUNITY): Payer: Medicare HMO

## 2012-04-14 ENCOUNTER — Encounter (HOSPITAL_COMMUNITY)
Admission: RE | Admit: 2012-04-14 | Discharge: 2012-04-14 | Disposition: A | Discharge status: Home / Self Care | Payer: Medicare HMO | Source: Ambulatory Visit | Attending: Internal Medicine | Admitting: Internal Medicine

## 2012-04-14 ENCOUNTER — Encounter (HOSPITAL_COMMUNITY): Payer: Medicare HMO

## 2012-04-14 DIAGNOSIS — N189 Chronic kidney disease, unspecified: Secondary | ICD-10-CM | POA: Insufficient documentation

## 2012-04-14 MED ORDER — DARBEPOETIN ALFA-POLYSORBATE 40 MCG/0.4ML IJ SOLN
INTRAMUSCULAR | Status: AC
  Filled 2012-04-14: qty 0.4

## 2012-04-14 MED ORDER — DARBEPOETIN ALFA-POLYSORBATE 40 MCG/0.4ML IJ SOLN
40.0000 ug | INTRAMUSCULAR | Status: DC
  Administered 2012-04-14: 40 ug via SUBCUTANEOUS

## 2012-04-15 ENCOUNTER — Encounter (HOSPITAL_COMMUNITY): Payer: Medicare HMO

## 2012-04-21 ENCOUNTER — Encounter (HOSPITAL_COMMUNITY)
Admission: RE | Admit: 2012-04-21 | Discharge: 2012-04-21 | Disposition: A | Discharge status: Home / Self Care | Payer: Medicare HMO | Source: Ambulatory Visit | Attending: Nephrology | Admitting: Nephrology

## 2012-04-21 LAB — POCT HEMOGLOBIN-HEMACUE: Hemoglobin: 11.9 g/dL — ABNORMAL LOW (ref 12.0–15.0)

## 2012-04-21 MED ORDER — DARBEPOETIN ALFA-POLYSORBATE 40 MCG/0.4ML IJ SOLN
40.0000 ug | INTRAMUSCULAR | Status: DC
  Administered 2012-04-21: 40 ug via SUBCUTANEOUS

## 2012-04-21 MED ORDER — DARBEPOETIN ALFA-POLYSORBATE 40 MCG/0.4ML IJ SOLN
INTRAMUSCULAR | Status: AC
  Filled 2012-04-21: qty 0.4

## 2012-04-28 ENCOUNTER — Encounter (HOSPITAL_COMMUNITY)
Admission: RE | Admit: 2012-04-28 | Discharge: 2012-04-28 | Disposition: A | Discharge status: Home / Self Care | Payer: Medicare HMO | Source: Ambulatory Visit | Attending: Nephrology | Admitting: Nephrology

## 2012-04-28 VITALS — BP 175/78 | HR 56 | Temp 97.8°F | Resp 20

## 2012-04-28 DIAGNOSIS — N189 Chronic kidney disease, unspecified: Secondary | ICD-10-CM

## 2012-04-28 LAB — RENAL FUNCTION PANEL
CO2: 28 mEq/L (ref 19–32)
Calcium: 9.8 mg/dL (ref 8.4–10.5)
Chloride: 100 mEq/L (ref 96–112)
GFR calc Af Amer: 9 mL/min — ABNORMAL LOW (ref >90)
GFR calc non Af Amer: 8 mL/min — ABNORMAL LOW (ref >90)
Sodium: 139 mEq/L (ref 135–145)

## 2012-04-28 LAB — IRON AND TIBC: Saturation Ratios: 28 % (ref 20–55)

## 2012-04-28 MED ORDER — DARBEPOETIN ALFA-POLYSORBATE 40 MCG/0.4ML IJ SOLN
40.0000 ug | INTRAMUSCULAR | Status: DC
  Administered 2012-04-28: 40 ug via SUBCUTANEOUS

## 2012-04-28 MED ORDER — DARBEPOETIN ALFA-POLYSORBATE 40 MCG/0.4ML IJ SOLN
INTRAMUSCULAR | Status: AC
  Filled 2012-04-28: qty 0.4

## 2012-05-03 ENCOUNTER — Encounter (INDEPENDENT_AMBULATORY_CARE_PROVIDER_SITE_OTHER): Payer: Medicare HMO | Admitting: Ophthalmology

## 2012-05-03 DIAGNOSIS — H43819 Vitreous degeneration, unspecified eye: Secondary | ICD-10-CM

## 2012-05-03 DIAGNOSIS — H35059 Retinal neovascularization, unspecified, unspecified eye: Secondary | ICD-10-CM

## 2012-05-03 DIAGNOSIS — H35039 Hypertensive retinopathy, unspecified eye: Secondary | ICD-10-CM

## 2012-05-03 DIAGNOSIS — I10 Essential (primary) hypertension: Secondary | ICD-10-CM

## 2012-05-12 ENCOUNTER — Encounter (HOSPITAL_COMMUNITY): Payer: Medicare HMO

## 2012-05-13 ENCOUNTER — Encounter (HOSPITAL_COMMUNITY)
Admission: RE | Admit: 2012-05-13 | Discharge: 2012-05-13 | Disposition: A | Discharge status: Home / Self Care | Payer: Medicare HMO | Source: Ambulatory Visit | Attending: Internal Medicine | Admitting: Internal Medicine

## 2012-05-13 DIAGNOSIS — N189 Chronic kidney disease, unspecified: Secondary | ICD-10-CM | POA: Insufficient documentation

## 2012-05-13 LAB — POCT HEMOGLOBIN-HEMACUE: Hemoglobin: 12.1 g/dL (ref 12.0–15.0)

## 2012-05-13 MED ORDER — DARBEPOETIN ALFA-POLYSORBATE 40 MCG/0.4ML IJ SOLN: 40.0000 ug | INTRAMUSCULAR | Status: DC

## 2012-05-26 ENCOUNTER — Encounter (HOSPITAL_COMMUNITY)
Admission: RE | Admit: 2012-05-26 | Discharge: 2012-05-26 | Disposition: A | Discharge status: Home / Self Care | Payer: Medicare HMO | Source: Ambulatory Visit | Attending: Nephrology | Admitting: Nephrology

## 2012-05-26 LAB — POCT HEMOGLOBIN-HEMACUE: Hemoglobin: 12.2 g/dL (ref 12.0–15.0)

## 2012-05-26 MED ORDER — DARBEPOETIN ALFA-POLYSORBATE 40 MCG/0.4ML IJ SOLN: 40.0000 ug | INTRAMUSCULAR | Status: DC

## 2012-05-31 ENCOUNTER — Encounter (INDEPENDENT_AMBULATORY_CARE_PROVIDER_SITE_OTHER): Payer: Medicare HMO | Admitting: Ophthalmology

## 2012-05-31 DIAGNOSIS — H35059 Retinal neovascularization, unspecified, unspecified eye: Secondary | ICD-10-CM

## 2012-06-02 ENCOUNTER — Ambulatory Visit (HOSPITAL_BASED_OUTPATIENT_CLINIC_OR_DEPARTMENT_OTHER): Payer: Commercial Managed Care - HMO | Admitting: Oncology

## 2012-06-02 ENCOUNTER — Telehealth: Payer: Self-pay | Admitting: Oncology

## 2012-06-02 ENCOUNTER — Other Ambulatory Visit (HOSPITAL_BASED_OUTPATIENT_CLINIC_OR_DEPARTMENT_OTHER): Payer: Medicare HMO | Admitting: Lab

## 2012-06-02 VITALS — BP 214/84 | HR 98 | Temp 98.0°F | Resp 19 | Ht 62.0 in | Wt 170.7 lb

## 2012-06-02 DIAGNOSIS — I129 Hypertensive chronic kidney disease with stage 1 through stage 4 chronic kidney disease, or unspecified chronic kidney disease: Secondary | ICD-10-CM

## 2012-06-02 DIAGNOSIS — D649 Anemia, unspecified: Secondary | ICD-10-CM

## 2012-06-02 DIAGNOSIS — D472 Monoclonal gammopathy: Secondary | ICD-10-CM

## 2012-06-02 DIAGNOSIS — N189 Chronic kidney disease, unspecified: Secondary | ICD-10-CM

## 2012-06-02 DIAGNOSIS — D729 Disorder of white blood cells, unspecified: Secondary | ICD-10-CM

## 2012-06-02 LAB — COMPREHENSIVE METABOLIC PANEL (CC13)
ALT: 24 U/L (ref 0–55)
AST: 42 U/L — ABNORMAL HIGH (ref 5–34)
Alkaline Phosphatase: 63 U/L (ref 40–150)
BUN: 70.5 mg/dL — ABNORMAL HIGH (ref 7.0–26.0)
Creatinine: 4.5 mg/dL (ref 0.6–1.1)
Total Bilirubin: 0.5 mg/dL (ref 0.20–1.20)

## 2012-06-02 LAB — CBC WITH DIFFERENTIAL/PLATELET
BASO%: 0.7 % (ref 0.0–2.0)
Basophils Absolute: 0 10*3/uL (ref 0.0–0.1)
EOS%: 3.7 % (ref 0.0–7.0)
HCT: 35.7 % (ref 34.8–46.6)
HGB: 11.3 g/dL — ABNORMAL LOW (ref 11.6–15.9)
MCH: 26.6 pg (ref 25.1–34.0)
MCHC: 31.7 g/dL (ref 31.5–36.0)
MCV: 84 fL (ref 79.5–101.0)
MONO%: 6.6 % (ref 0.0–14.0)
NEUT%: 70.4 % (ref 38.4–76.8)

## 2012-06-02 NOTE — Progress Notes (Signed)
Hematology and Oncology Follow Up Visit  Jaclyn Morgan [id number] 31-Oct-1932 79 y.o. 06/02/2012 1:34 PM  CC: Jaclyn Morgan, M.D.    Principle Diagnosis: This is a 79 year old female with monoclonal gammopathy of undetermined significance.  She has an IgG lambda with an M spike about 0.8 g/dL without any evidence of multiple myeloma diagnosed in 2011.  SECONDARY DIAGNOSIS:  Including chronic renal sufficiency due to longstanding, poorly controlled hypertension.  Current therapy: Observation and surveillance.   Interim History:  Mrs. Jaclyn Morgan presents today for followup visit.  Pleasant 79 year old female with history of longstanding hypertension that I have been following for possible plasma cell disorder.  As mentioned, I have did not really see any evidence to suggest end-organ damage.  She does have renal insufficiency.  I felt that this is probably related to her longstanding hypertension.  Since the last time I saw her she  is not reporting any particular symptomatology.  Is not reporting any headaches, not reporting any blurry vision.  Has not had any pathological fractures.  Did not report any major changes in her performance status or activity level.  She had not had any pathological bony fractures and not had any hospitalization and not had any illnesses. She has managed not to get dialysis at this time.   Medications: I have reviewed the patient's current medications.  Current Outpatient Prescriptions  Medication Sig Dispense Refill  . aspirin (BAYER ASPIRIN) 325 MG tablet Take 325 mg by mouth daily.        . captopril (CAPOTEN) 50 MG tablet Take 50 mg by mouth 2 (two) times daily.        . cloNIDine (CATAPRES) 0.1 MG tablet Take 0.1 mg by mouth 2 (two) times daily.        . diazepam (VALIUM) 5 MG tablet take 1 tablet by mouth twice a day  60 tablet  5  . doxazosin (CARDURA) 2 MG tablet Take 2 mg by mouth 2 (two) times daily.        . furosemide (LASIX) 40 MG tablet Take 40 mg by  mouth daily.        . hydrALAZINE (APRESOLINE) 25 MG tablet       . isosorbide mononitrate (IMDUR) 30 MG 24 hr tablet       . labetalol (NORMODYNE) 200 MG tablet Take 200 mg by mouth 2 (two) times daily.        Marland Kitchen lisinopril (PRINIVIL,ZESTRIL) 20 MG tablet Take 20 mg by mouth daily.      . Multiple Vitamins-Calcium (ONE-A-DAY WOMENS PO) Take by mouth.        . rosuvastatin (CRESTOR) 10 MG tablet Take 10 mg by mouth at bedtime.       No current facility-administered medications for this visit.      Allergies: No Known Allergies  Past Medical History, Surgical history, Social history, and Family History were reviewed and updated.  Review of Systems: Constitutional:  Negative for fever, chills, night sweats, anorexia, weight loss, pain. Cardiovascular: no chest pain or dyspnea on exertion Respiratory: negative Neurological: negative Dermatological: negative ENT: negative Skin: Negative. Gastrointestinal: no abdominal pain, change in bowel habits, or black or bloody stools Genito-Urinary: negative Hematological and Lymphatic: negative Breast: negative Musculoskeletal: negative Remaining ROS negative. Physical Exam: Blood pressure 214/84, pulse 98, temperature 98 F (36.7 C), temperature source Oral, resp. rate 19, height 5\' 2"  (1.575 m), weight 170 lb 11.2 oz (77.429 kg). ECOG: 1 General appearance: alert Head: Normocephalic, without obvious abnormality,  atraumatic Neck: no adenopathy, no carotid bruit, no JVD, supple, symmetrical, trachea midline and thyroid not enlarged, symmetric, no tenderness/mass/nodules Lymph nodes: Cervical, supraclavicular, and axillary nodes normal. Heart:regular rate and rhythm, S1, S2 normal, no murmur, click, rub or gallop Lung:chest clear, no wheezing, rales, normal symmetric air entry Abdomin: soft, non-tender, without masses or organomegaly EXT:no erythema, induration, or nodules   Lab Results: Lab Results  Component Value Date   WBC 6.2  06/02/2012   HGB 11.3* 06/02/2012   HCT 35.7 06/02/2012   MCV 84.0 06/02/2012   PLT 180 06/02/2012     Chemistry      Component Value Date/Time   NA 139 04/28/2012 1400   NA 139 12/05/2011 1542   K 3.7 04/28/2012 1400   K 3.7 12/05/2011 1542   CL 100 04/28/2012 1400   CL 102 12/05/2011 1542   CO2 28 04/28/2012 1400   CO2 29 12/05/2011 1542   BUN 90* 04/28/2012 1400   BUN 91.0* 12/05/2011 1542   CREATININE 4.68* 04/28/2012 1400   CREATININE 4.5* 12/05/2011 1542      Component Value Date/Time   CALCIUM 9.8 04/28/2012 1400   CALCIUM 9.6 12/05/2011 1542   ALKPHOS 55 12/05/2011 1542   ALKPHOS 54 04/30/2011 1408   AST 36* 12/05/2011 1542   AST 36 04/30/2011 1408   ALT 17 12/05/2011 1542   ALT 18 04/30/2011 1408   BILITOT 0.67 12/05/2011 1542   BILITOT 0.5 04/30/2011 1408     Results for Jaclyn, Morgan (MRN [id number]) as of 06/02/2012 13:17  Ref. Range 10/09/2010 10:16 04/30/2011 14:08 12/05/2011 15:41  M-SPIKE, % No range found 0.68 0.52 0.66  Results for Jaclyn, Morgan (MRN [id number]) as of 06/02/2012 13:17  Ref. Range 10/09/2010 10:16 04/30/2011 14:08 12/05/2011 15:41  IgG (Immunoglobin G), Serum Latest Range: [phone] mg/dL [phone]    Impression and Plan:  A 79 year old female with the following issues:  1. Monoclonal gammopathy of undetermined significance, IgG lambda subtype.  Her M spike is less than 1g/dL and representing less likely the multiple myeloma.  We will continue active surveillance at this point and have her followup in 6 months time.  Certainly, if she develops any end-organ damage or worsening cytopenias then we will restage her with a bone marrow biopsy and a skeletal survey at this time.  I see no reason for that at this time. Her M spike have gone down which goes against MM.   2. Renal sufficiency:  Again it looks like it is improving as her blood pressure is under better control.  Continue to follow with Dr. Hyman Hopes.  3. Anemia: Likely due to renal failure.  Stable at this point.      Eli Hose, MD 5/21/20141:34 PM

## 2012-06-02 NOTE — Telephone Encounter (Signed)
gv and printed appt sched and avs for pt  °

## 2012-06-02 NOTE — Addendum Note (Signed)
Addended by: Reesa Chew on: 06/02/2012 02:00 PM   Modules accepted: Orders, Medications

## 2012-06-04 LAB — SPEP & IFE WITH QIG
Albumin ELP: 56 % (ref 55.8–66.1)
Alpha-1-Globulin: 4.4 % (ref 2.9–4.9)
Gamma Globulin: 17.8 % (ref 11.1–18.8)
IgM, Serum: 23 mg/dL — ABNORMAL LOW (ref 52–322)
Total Protein, Serum Electrophoresis: 6.2 g/dL (ref 6.0–8.3)

## 2012-06-09 ENCOUNTER — Encounter (HOSPITAL_COMMUNITY)
Admission: RE | Admit: 2012-06-09 | Discharge: 2012-06-09 | Disposition: A | Discharge status: Home / Self Care | Payer: Medicare HMO | Source: Ambulatory Visit | Attending: Nephrology | Admitting: Nephrology

## 2012-06-09 ENCOUNTER — Encounter (HOSPITAL_COMMUNITY): Payer: Medicare HMO

## 2012-06-09 LAB — POCT HEMOGLOBIN-HEMACUE: Hemoglobin: 11.4 g/dL — ABNORMAL LOW (ref 12.0–15.0)

## 2012-06-09 MED ORDER — DARBEPOETIN ALFA-POLYSORBATE 40 MCG/0.4ML IJ SOLN: 40.0000 ug | INTRAMUSCULAR | Status: DC

## 2012-06-09 MED ORDER — CLONIDINE HCL 0.1 MG PO TABS
0.1000 mg | ORAL_TABLET | Freq: Once | ORAL | Status: AC | PRN
  Administered 2012-06-09: 0.1 mg via ORAL

## 2012-06-09 MED ORDER — CLONIDINE HCL 0.1 MG PO TABS
ORAL_TABLET | ORAL | Status: AC
  Filled 2012-06-09: qty 1

## 2012-06-09 NOTE — Progress Notes (Signed)
Clonidine 0.1 mg given BP parameters not met

## 2012-06-15 ENCOUNTER — Other Ambulatory Visit (HOSPITAL_COMMUNITY): Payer: Self-pay | Admitting: *Deleted

## 2012-06-16 ENCOUNTER — Encounter (HOSPITAL_COMMUNITY): Payer: Medicare HMO

## 2012-06-24 ENCOUNTER — Encounter (HOSPITAL_COMMUNITY)
Admission: RE | Admit: 2012-06-24 | Discharge: 2012-06-24 | Disposition: A | Discharge status: Home / Self Care | Payer: Medicare HMO | Source: Ambulatory Visit | Attending: Internal Medicine | Admitting: Internal Medicine

## 2012-06-24 VITALS — BP 179/97 | HR 53 | Temp 98.0°F | Resp 18

## 2012-06-24 DIAGNOSIS — N189 Chronic kidney disease, unspecified: Secondary | ICD-10-CM | POA: Insufficient documentation

## 2012-06-24 LAB — RENAL FUNCTION PANEL
CO2: 23 mEq/L (ref 19–32)
Chloride: 99 mEq/L (ref 96–112)
GFR calc Af Amer: 8 mL/min — ABNORMAL LOW (ref >90)
Glucose, Bld: 108 mg/dL — ABNORMAL HIGH (ref 70–99)
Potassium: 4.1 mEq/L (ref 3.5–5.1)
Sodium: 136 mEq/L (ref 135–145)

## 2012-06-24 LAB — IRON AND TIBC
Iron: 83 ug/dL (ref 42–135)
Saturation Ratios: 38 % (ref 20–55)
TIBC: 217 ug/dL — ABNORMAL LOW (ref 250–470)
UIBC: 134 ug/dL (ref 125–400)

## 2012-06-24 MED ORDER — DARBEPOETIN ALFA-POLYSORBATE 40 MCG/0.4ML IJ SOLN
40.0000 ug | INTRAMUSCULAR | Status: DC
  Administered 2012-06-24: 40 ug via SUBCUTANEOUS

## 2012-06-24 MED ORDER — DARBEPOETIN ALFA-POLYSORBATE 40 MCG/0.4ML IJ SOLN
INTRAMUSCULAR | Status: AC
  Filled 2012-06-24: qty 0.4

## 2012-06-30 ENCOUNTER — Encounter (INDEPENDENT_AMBULATORY_CARE_PROVIDER_SITE_OTHER): Payer: Medicare HMO | Admitting: Ophthalmology

## 2012-07-06 ENCOUNTER — Other Ambulatory Visit (HOSPITAL_COMMUNITY): Payer: Self-pay | Admitting: *Deleted

## 2012-07-07 ENCOUNTER — Encounter (HOSPITAL_COMMUNITY): Payer: Medicare HMO

## 2012-07-12 ENCOUNTER — Telehealth: Payer: Self-pay | Admitting: Internal Medicine

## 2012-07-12 NOTE — Telephone Encounter (Signed)
[facility]line called and states that Mrs. Pool needs samples of Crestor 20 mg.  Carline will pick these up on Thursday of this week.

## 2012-07-13 ENCOUNTER — Encounter (HOSPITAL_COMMUNITY)
Admission: RE | Admit: 2012-07-13 | Discharge: 2012-07-13 | Disposition: A | Discharge status: Home / Self Care | Payer: Medicare HMO | Source: Ambulatory Visit | Attending: Internal Medicine | Admitting: Internal Medicine

## 2012-07-13 DIAGNOSIS — N189 Chronic kidney disease, unspecified: Secondary | ICD-10-CM | POA: Insufficient documentation

## 2012-07-13 MED ORDER — CLONIDINE HCL 0.1 MG PO TABS
0.1000 mg | ORAL_TABLET | Freq: Once | ORAL | Status: AC | PRN
  Administered 2012-07-13: 0.1 mg via ORAL

## 2012-07-13 MED ORDER — DARBEPOETIN ALFA-POLYSORBATE 40 MCG/0.4ML IJ SOLN
40.0000 ug | INTRAMUSCULAR | Status: DC
  Administered 2012-07-13: 40 ug via SUBCUTANEOUS

## 2012-07-13 MED ORDER — DARBEPOETIN ALFA-POLYSORBATE 40 MCG/0.4ML IJ SOLN
INTRAMUSCULAR | Status: AC
  Filled 2012-07-13: qty 0.4

## 2012-07-13 MED ORDER — CLONIDINE HCL 0.1 MG PO TABS
ORAL_TABLET | ORAL | Status: AC
  Filled 2012-07-13: qty 1

## 2012-07-13 NOTE — Telephone Encounter (Signed)
Samples left at front desk for pt pick up.  Lot: [id number]  Exp: 07/2014.

## 2012-07-14 ENCOUNTER — Encounter (INDEPENDENT_AMBULATORY_CARE_PROVIDER_SITE_OTHER): Payer: Medicare HMO | Admitting: Ophthalmology

## 2012-07-20 ENCOUNTER — Telehealth: Payer: Self-pay | Admitting: Internal Medicine

## 2012-07-20 NOTE — Telephone Encounter (Signed)
Returned call and informed samples left at front desk on 6.30.14.  Verbalized understanding and agreed w/ plan.

## 2012-07-20 NOTE — Telephone Encounter (Signed)
Would like some samples of Crestor 20mg  or 10mg -Please call and let her know if you have any!

## 2012-07-23 ENCOUNTER — Ambulatory Visit (INDEPENDENT_AMBULATORY_CARE_PROVIDER_SITE_OTHER): Payer: Medicare HMO | Admitting: Internal Medicine

## 2012-07-23 ENCOUNTER — Encounter: Payer: Self-pay | Admitting: Internal Medicine

## 2012-07-23 ENCOUNTER — Ambulatory Visit (INDEPENDENT_AMBULATORY_CARE_PROVIDER_SITE_OTHER): Payer: Medicare HMO

## 2012-07-23 ENCOUNTER — Telehealth: Payer: Self-pay

## 2012-07-23 VITALS — BP 130/88 | HR 60 | Temp 97.9°F | Resp 16

## 2012-07-23 DIAGNOSIS — R350 Frequency of micturition: Secondary | ICD-10-CM

## 2012-07-23 DIAGNOSIS — Z23 Encounter for immunization: Secondary | ICD-10-CM

## 2012-07-23 DIAGNOSIS — I10 Essential (primary) hypertension: Secondary | ICD-10-CM

## 2012-07-23 DIAGNOSIS — N390 Urinary tract infection, site not specified: Secondary | ICD-10-CM

## 2012-07-23 DIAGNOSIS — F411 Generalized anxiety disorder: Secondary | ICD-10-CM

## 2012-07-23 DIAGNOSIS — E785 Hyperlipidemia, unspecified: Secondary | ICD-10-CM

## 2012-07-23 DIAGNOSIS — N184 Chronic kidney disease, stage 4 (severe): Secondary | ICD-10-CM

## 2012-07-23 DIAGNOSIS — F419 Anxiety disorder, unspecified: Secondary | ICD-10-CM

## 2012-07-23 DIAGNOSIS — E039 Hypothyroidism, unspecified: Secondary | ICD-10-CM

## 2012-07-23 LAB — CBC WITH DIFFERENTIAL/PLATELET
Basophils Absolute: 0 10*3/uL (ref 0.0–0.1)
Eosinophils Absolute: 0.2 10*3/uL (ref 0.0–0.7)
Hemoglobin: 10.4 g/dL — ABNORMAL LOW (ref 12.0–15.0)
Lymphocytes Relative: 16.5 % (ref 12.0–46.0)
MCHC: 32.6 g/dL (ref 30.0–36.0)
MCV: 84.8 fl (ref 78.0–100.0)
Monocytes Absolute: 0.4 10*3/uL (ref 0.1–1.0)
Neutro Abs: 4.3 10*3/uL (ref 1.4–7.7)
RDW: 14.4 % (ref 11.5–14.6)

## 2012-07-23 LAB — URINALYSIS, ROUTINE W REFLEX MICROSCOPIC
Ketones, ur: NEGATIVE
Specific Gravity, Urine: >=1.03 (ref 1.000–1.030)
Total Protein, Urine: >=300
Urine Glucose: NEGATIVE
Urobilinogen, UA: 0.2 (ref 0.0–1.0)
pH: 6 (ref 5.0–8.0)

## 2012-07-23 LAB — COMPREHENSIVE METABOLIC PANEL
ALT: 19 U/L (ref 0–35)
AST: 30 U/L (ref 0–37)
Albumin: 2.9 g/dL — ABNORMAL LOW (ref 3.5–5.2)
Alkaline Phosphatase: 51 U/L (ref 39–117)
Calcium: 9.1 mg/dL (ref 8.4–10.5)
Chloride: 106 mEq/L (ref 96–112)
Creatinine, Ser: 5.2 mg/dL (ref 0.4–1.2)
Potassium: 4.1 mEq/L (ref 3.5–5.1)

## 2012-07-23 LAB — LIPID PANEL
Total CHOL/HDL Ratio: 5
VLDL: 31.8 mg/dL (ref 0.0–40.0)

## 2012-07-23 MED ORDER — DIAZEPAM 5 MG PO TABS: 5.0000 mg | ORAL_TABLET | Freq: Two times a day (BID) | ORAL | Status: DC | PRN

## 2012-07-23 MED ORDER — CIPROFLOXACIN HCL 250 MG PO TABS: 250.0000 mg | ORAL_TABLET | Freq: Two times a day (BID) | ORAL | Status: AC

## 2012-07-23 NOTE — Telephone Encounter (Signed)
Jaclyn Morgan called to report critical GFR of 10.33 and creatinine 5.18. Thanks

## 2012-07-23 NOTE — Patient Instructions (Signed)

## 2012-07-23 NOTE — Progress Notes (Signed)
Subjective:    Patient ID: Jaclyn Morgan, female    DOB: 1932/10/22, 79 y.o.   MRN: [id number]  Dysuria  This is a recurrent problem. The current episode started more than 1 month ago. The problem occurs intermittently. The problem has been gradually worsening. The quality of the pain is described as burning. The pain is at a severity of 1/10. The pain is mild. There has been no fever. The fever has been present for less than 1 day. She is not sexually active. There is no history of pyelonephritis. Associated symptoms include frequency and urgency. Pertinent negatives include no chills, discharge, flank pain, hematuria, hesitancy, nausea, sweats or vomiting. She has tried nothing for the symptoms. The treatment provided no relief. Her past medical history is significant for urinary stasis. There is no history of catheterization, kidney stones, recurrent UTIs, a single kidney or a urological procedure.      Review of Systems  Constitutional: Negative.  Negative for fever, chills, diaphoresis, fatigue and unexpected weight change.  HENT: Negative.   Eyes: Negative.   Respiratory: Negative.  Negative for cough, chest tightness, shortness of breath, wheezing and stridor.   Cardiovascular: Negative.  Negative for chest pain, palpitations and leg swelling.  Gastrointestinal: Negative.  Negative for nausea, vomiting, abdominal pain, diarrhea, constipation and blood in stool.  Endocrine: Negative.   Genitourinary: Positive for dysuria, urgency and frequency. Negative for hesitancy, hematuria, flank pain, decreased urine volume, vaginal bleeding, vaginal discharge, enuresis, difficulty urinating, genital sores, vaginal pain, menstrual problem, pelvic pain and dyspareunia.  Musculoskeletal: Negative.   Skin: Negative.   Allergic/Immunologic: Negative.   Neurological: Negative.   Hematological: Negative.  Negative for adenopathy. Does not bruise/bleed easily.  Psychiatric/Behavioral: Positive for  confusion, sleep disturbance and decreased concentration. Negative for suicidal ideas, hallucinations, behavioral problems, self-injury, dysphoric mood and agitation. The patient is nervous/anxious. The patient is not hyperactive.        Objective:   Physical Exam  Vitals reviewed. Constitutional: She is oriented to person, place, and time. She appears well-developed and well-nourished.  Non-toxic appearance. She does not have a sickly appearance. She appears ill (wheelchair bound). No distress.  HENT:  Head: Normocephalic and atraumatic.  Mouth/Throat: Oropharynx is clear and moist. No oropharyngeal exudate.  Eyes: Conjunctivae are normal. Right eye exhibits no discharge. Left eye exhibits no discharge. No scleral icterus.  Neck: Normal range of motion. Neck supple. No JVD present. No tracheal deviation present. No thyromegaly present.  Cardiovascular: Normal rate, regular rhythm, normal heart sounds and intact distal pulses.  Exam reveals no gallop and no friction rub.   No murmur heard. Pulmonary/Chest: Effort normal and breath sounds normal. No stridor. No respiratory distress. She has no wheezes. She has no rales. She exhibits no tenderness.  Abdominal: Soft. Bowel sounds are normal. She exhibits no distension and no mass. There is no tenderness. There is no rebound and no guarding.  Musculoskeletal: Normal range of motion. She exhibits no edema and no tenderness.  Lymphadenopathy:    She has no cervical adenopathy.  Neurological: She is oriented to person, place, and time.  Skin: Skin is warm and dry. No rash noted. She is not diaphoretic. No erythema. No pallor.  Psychiatric: Judgment and thought content normal. Her mood appears not anxious. Her affect is not angry, not labile and not inappropriate. Her speech is delayed and tangential. She is slowed and withdrawn. She is not agitated, not aggressive, not hyperactive and not combative. Cognition and memory are normal. She  does not  exhibit a depressed mood. She is inattentive.     Lab Results  Component Value Date   WBC 6.2 06/02/2012   HGB 10.7* 07/13/2012   HCT 35.7 06/02/2012   PLT 180 06/02/2012   GLUCOSE 108* 06/24/2012   ALT 24 06/02/2012   AST 42* 06/02/2012   NA 136 06/24/2012   K 4.1 06/24/2012   CL 99 06/24/2012   CREATININE 5.13* 06/24/2012   BUN 109* 06/24/2012   CO2 23 06/24/2012       Assessment & Plan:

## 2012-07-25 ENCOUNTER — Encounter: Payer: Self-pay | Admitting: Internal Medicine

## 2012-07-25 NOTE — Assessment & Plan Note (Addendum)
UA is abn and Clx is + for Klebsiella, will treat with cipro

## 2012-07-25 NOTE — Assessment & Plan Note (Signed)
I think this is due to the UTI, will check labs to look for other causes (hypreglycemia, lytes, etc.)

## 2012-07-25 NOTE — Assessment & Plan Note (Signed)
FLP TSH today

## 2012-07-25 NOTE — Assessment & Plan Note (Signed)
Managed by Dr. Webb. 

## 2012-07-25 NOTE — Assessment & Plan Note (Signed)
BP is adequately well controlled

## 2012-07-25 NOTE — Assessment & Plan Note (Signed)
Continue valium as needed

## 2012-07-26 DIAGNOSIS — E039 Hypothyroidism, unspecified: Secondary | ICD-10-CM | POA: Insufficient documentation

## 2012-07-26 LAB — CULTURE, URINE COMPREHENSIVE: Colony Count: >=100000

## 2012-07-26 LAB — LDL CHOLESTEROL, DIRECT: Direct LDL: 171.1 mg/dL

## 2012-07-26 MED ORDER — LEVOTHYROXINE SODIUM 25 MCG PO TABS: 25.0000 ug | ORAL_TABLET | Freq: Every day | ORAL | Status: DC

## 2012-07-26 NOTE — Assessment & Plan Note (Signed)
TSH is up to 15 so I have asked her to start synthroid

## 2012-07-26 NOTE — Addendum Note (Signed)
Addended by: Etta Grandchild on: 07/26/2012 12:05 PM   Modules accepted: Orders

## 2012-07-27 ENCOUNTER — Other Ambulatory Visit (HOSPITAL_COMMUNITY): Payer: Self-pay | Admitting: *Deleted

## 2012-07-28 ENCOUNTER — Encounter (HOSPITAL_COMMUNITY)
Admission: RE | Admit: 2012-07-28 | Discharge: 2012-07-28 | Disposition: A | Discharge status: Home / Self Care | Payer: Medicare HMO | Source: Ambulatory Visit | Attending: Nephrology | Admitting: Nephrology

## 2012-07-28 VITALS — BP 194/107 | HR 62 | Temp 97.6°F | Resp 20

## 2012-07-28 DIAGNOSIS — N189 Chronic kidney disease, unspecified: Secondary | ICD-10-CM

## 2012-07-28 MED ORDER — DARBEPOETIN ALFA-POLYSORBATE 40 MCG/0.4ML IJ SOLN: 40.0000 ug | INTRAMUSCULAR | Status: DC

## 2012-07-28 MED ORDER — CLONIDINE HCL 0.1 MG PO TABS: 0.1000 mg | ORAL_TABLET | Freq: Once | ORAL | Status: AC | PRN

## 2012-07-28 MED ORDER — CLONIDINE HCL 0.1 MG PO TABS
ORAL_TABLET | ORAL | Status: AC
  Administered 2012-07-28: 0.1 mg via ORAL
  Filled 2012-07-28: qty 1

## 2012-07-28 NOTE — Progress Notes (Signed)
BP remained greater than parameters set for receiving aranesp after clonidine administration.  Rescheduled

## 2012-08-04 ENCOUNTER — Encounter (HOSPITAL_COMMUNITY)
Admission: RE | Admit: 2012-08-04 | Discharge: 2012-08-04 | Disposition: A | Discharge status: Home / Self Care | Payer: Medicare HMO | Source: Ambulatory Visit | Attending: Nephrology | Admitting: Nephrology

## 2012-08-04 MED ORDER — DARBEPOETIN ALFA-POLYSORBATE 40 MCG/0.4ML IJ SOLN
INTRAMUSCULAR | Status: AC
  Filled 2012-08-04: qty 0.4

## 2012-08-04 MED ORDER — DARBEPOETIN ALFA-POLYSORBATE 40 MCG/0.4ML IJ SOLN
40.0000 ug | INTRAMUSCULAR | Status: DC
  Administered 2012-08-04: 40 ug via SUBCUTANEOUS

## 2012-08-10 ENCOUNTER — Encounter (HOSPITAL_COMMUNITY): Payer: Medicare HMO

## 2012-08-17 ENCOUNTER — Encounter (HOSPITAL_COMMUNITY)
Admission: RE | Admit: 2012-08-17 | Discharge: 2012-08-17 | Disposition: A | Discharge status: Home / Self Care | Payer: Medicare HMO | Source: Ambulatory Visit | Attending: Internal Medicine | Admitting: Internal Medicine

## 2012-08-17 DIAGNOSIS — N189 Chronic kidney disease, unspecified: Secondary | ICD-10-CM | POA: Insufficient documentation

## 2012-08-17 MED ORDER — DARBEPOETIN ALFA-POLYSORBATE 40 MCG/0.4ML IJ SOLN
INTRAMUSCULAR | Status: AC
  Filled 2012-08-17: qty 0.4

## 2012-08-17 MED ORDER — CLONIDINE HCL 0.1 MG PO TABS
0.1000 mg | ORAL_TABLET | Freq: Once | ORAL | Status: AC | PRN
  Administered 2012-08-17: 0.1 mg via ORAL

## 2012-08-17 MED ORDER — CLONIDINE HCL 0.1 MG PO TABS
ORAL_TABLET | ORAL | Status: AC
  Filled 2012-08-17: qty 1

## 2012-08-17 MED ORDER — DARBEPOETIN ALFA-POLYSORBATE 40 MCG/0.4ML IJ SOLN
40.0000 ug | INTRAMUSCULAR | Status: DC
  Administered 2012-08-17: 40 ug via SUBCUTANEOUS

## 2012-08-23 ENCOUNTER — Encounter (INDEPENDENT_AMBULATORY_CARE_PROVIDER_SITE_OTHER): Payer: Medicare HMO | Admitting: Ophthalmology

## 2012-08-31 ENCOUNTER — Telehealth: Payer: Self-pay | Admitting: *Deleted

## 2012-08-31 ENCOUNTER — Encounter (HOSPITAL_COMMUNITY): Payer: Medicare HMO

## 2012-08-31 NOTE — Telephone Encounter (Signed)
Spoke with daughter advised of MDs message. 

## 2012-08-31 NOTE — Telephone Encounter (Signed)
Called states the Thyroid medication is causing the pt to have weakness.  Please advise

## 2012-08-31 NOTE — Telephone Encounter (Signed)
Stop it and f/up with me in 2-3 weeks

## 2012-09-02 ENCOUNTER — Telehealth: Payer: Self-pay | Admitting: *Deleted

## 2012-09-02 DIAGNOSIS — N390 Urinary tract infection, site not specified: Secondary | ICD-10-CM

## 2012-09-02 MED ORDER — CIPROFLOXACIN HCL 500 MG PO TABS: 500.0000 mg | ORAL_TABLET | Freq: Two times a day (BID) | ORAL | Status: DC

## 2012-09-02 NOTE — Telephone Encounter (Signed)
Restart cipro

## 2012-09-02 NOTE — Telephone Encounter (Signed)
Advised Rx sent to pharmacy 

## 2012-09-02 NOTE — Telephone Encounter (Signed)
Pts daughter called states pt is having urinary incontinence and the bladder infection has returned.  Further states pt is unable to come in for OV due to fall last night.  Please advise

## 2012-09-03 ENCOUNTER — Encounter (INDEPENDENT_AMBULATORY_CARE_PROVIDER_SITE_OTHER): Payer: Medicare HMO | Admitting: Ophthalmology

## 2012-09-06 ENCOUNTER — Encounter (HOSPITAL_COMMUNITY): Payer: Medicare HMO

## 2012-09-08 ENCOUNTER — Encounter (INDEPENDENT_AMBULATORY_CARE_PROVIDER_SITE_OTHER): Payer: Medicare HMO | Admitting: Ophthalmology

## 2012-09-09 ENCOUNTER — Encounter (HOSPITAL_COMMUNITY): Payer: Medicare HMO

## 2012-09-16 ENCOUNTER — Encounter (HOSPITAL_COMMUNITY): Payer: Medicare HMO

## 2012-09-16 ENCOUNTER — Encounter (HOSPITAL_COMMUNITY)
Admission: RE | Admit: 2012-09-16 | Discharge: 2012-09-16 | Disposition: A | Discharge status: Home / Self Care | Payer: Medicare HMO | Source: Ambulatory Visit | Attending: Internal Medicine | Admitting: Internal Medicine

## 2012-09-16 VITALS — BP 163/100 | HR 57 | Temp 98.3°F | Resp 20

## 2012-09-16 DIAGNOSIS — N189 Chronic kidney disease, unspecified: Secondary | ICD-10-CM

## 2012-09-16 LAB — RENAL FUNCTION PANEL
CO2: 23 mEq/L (ref 19–32)
Calcium: 9.3 mg/dL (ref 8.4–10.5)
GFR calc Af Amer: 6 mL/min — ABNORMAL LOW (ref >90)
Glucose, Bld: 104 mg/dL — ABNORMAL HIGH (ref 70–99)
Sodium: 137 mEq/L (ref 135–145)

## 2012-09-16 LAB — IRON AND TIBC
Iron: 77 ug/dL (ref 42–135)
Saturation Ratios: 38 % (ref 20–55)
TIBC: 205 ug/dL — ABNORMAL LOW (ref 250–470)
UIBC: 128 ug/dL (ref 125–400)

## 2012-09-16 LAB — POCT HEMOGLOBIN-HEMACUE: Hemoglobin: 9.7 g/dL — ABNORMAL LOW (ref 12.0–15.0)

## 2012-09-16 LAB — FERRITIN: Ferritin: 300 ng/mL — ABNORMAL HIGH (ref 10–291)

## 2012-09-16 MED ORDER — DARBEPOETIN ALFA-POLYSORBATE 40 MCG/0.4ML IJ SOLN
INTRAMUSCULAR | Status: AC
  Filled 2012-09-16: qty 0.4

## 2012-09-16 MED ORDER — DARBEPOETIN ALFA-POLYSORBATE 40 MCG/0.4ML IJ SOLN
40.0000 ug | INTRAMUSCULAR | Status: DC
  Administered 2012-09-16: 40 ug via SUBCUTANEOUS

## 2012-09-27 ENCOUNTER — Telehealth: Payer: Self-pay | Admitting: Internal Medicine

## 2012-09-27 NOTE — Telephone Encounter (Signed)
rec'd records from [facility]. Forwarding 5pgs to Dr.Jones

## 2012-09-30 ENCOUNTER — Encounter (HOSPITAL_COMMUNITY): Payer: Commercial Managed Care - HMO

## 2012-10-07 ENCOUNTER — Encounter (HOSPITAL_COMMUNITY): Payer: Medicare HMO

## 2012-10-11 ENCOUNTER — Encounter (HOSPITAL_COMMUNITY)
Admission: RE | Admit: 2012-10-11 | Discharge: 2012-10-11 | Disposition: A | Discharge status: Home / Self Care | Payer: Medicare HMO | Source: Ambulatory Visit | Attending: Nephrology | Admitting: Nephrology

## 2012-10-11 VITALS — BP 197/91 | HR 58 | Temp 97.8°F | Resp 20

## 2012-10-11 DIAGNOSIS — N189 Chronic kidney disease, unspecified: Secondary | ICD-10-CM

## 2012-10-11 LAB — IRON AND TIBC
Iron: 49 ug/dL (ref 42–135)
TIBC: 208 ug/dL — ABNORMAL LOW (ref 250–470)

## 2012-10-11 LAB — RENAL FUNCTION PANEL
Albumin: 2.9 g/dL — ABNORMAL LOW (ref 3.5–5.2)
Chloride: 102 mEq/L (ref 96–112)
Creatinine, Ser: 6.27 mg/dL — ABNORMAL HIGH (ref 0.50–1.10)
GFR calc non Af Amer: 6 mL/min — ABNORMAL LOW (ref >90)
Potassium: 3.7 mEq/L (ref 3.5–5.1)

## 2012-10-11 LAB — POCT HEMOGLOBIN-HEMACUE: Hemoglobin: 9.9 g/dL — ABNORMAL LOW (ref 12.0–15.0)

## 2012-10-11 MED ORDER — DARBEPOETIN ALFA-POLYSORBATE 40 MCG/0.4ML IJ SOLN
40.0000 ug | INTRAMUSCULAR | Status: DC
  Administered 2012-10-11: 40 ug via SUBCUTANEOUS

## 2012-10-11 NOTE — Progress Notes (Signed)
Called [facility] and spoke with [place] regarding pt's bp. Pt took her bp meds 30 min prior to coming for appt. Orders received.

## 2012-10-12 ENCOUNTER — Encounter (INDEPENDENT_AMBULATORY_CARE_PROVIDER_SITE_OTHER): Payer: Medicare HMO | Admitting: Ophthalmology

## 2012-10-18 ENCOUNTER — Encounter (INDEPENDENT_AMBULATORY_CARE_PROVIDER_SITE_OTHER): Payer: Medicare HMO | Admitting: Ophthalmology

## 2012-10-25 ENCOUNTER — Other Ambulatory Visit (HOSPITAL_COMMUNITY): Payer: Self-pay | Admitting: *Deleted

## 2012-10-26 ENCOUNTER — Encounter (HOSPITAL_COMMUNITY): Payer: Commercial Managed Care - HMO

## 2012-10-29 ENCOUNTER — Other Ambulatory Visit: Payer: Self-pay | Admitting: *Deleted

## 2012-10-29 DIAGNOSIS — N184 Chronic kidney disease, stage 4 (severe): Secondary | ICD-10-CM

## 2012-10-29 DIAGNOSIS — Z01810 Encounter for preprocedural cardiovascular examination: Secondary | ICD-10-CM

## 2012-11-02 ENCOUNTER — Encounter (HOSPITAL_COMMUNITY)
Admission: RE | Admit: 2012-11-02 | Discharge: 2012-11-02 | Disposition: A | Discharge status: Home / Self Care | Payer: Medicare HMO | Source: Ambulatory Visit | Attending: Internal Medicine | Admitting: Internal Medicine

## 2012-11-02 DIAGNOSIS — N189 Chronic kidney disease, unspecified: Secondary | ICD-10-CM | POA: Insufficient documentation

## 2012-11-02 MED ORDER — DARBEPOETIN ALFA-POLYSORBATE 40 MCG/0.4ML IJ SOLN
40.0000 ug | INTRAMUSCULAR | Status: DC
  Administered 2012-11-02: 40 ug via SUBCUTANEOUS

## 2012-11-02 MED ORDER — DARBEPOETIN ALFA-POLYSORBATE 40 MCG/0.4ML IJ SOLN
INTRAMUSCULAR | Status: AC
  Filled 2012-11-02: qty 0.4

## 2012-11-03 ENCOUNTER — Encounter (INDEPENDENT_AMBULATORY_CARE_PROVIDER_SITE_OTHER): Payer: Medicare HMO | Admitting: Ophthalmology

## 2012-11-05 ENCOUNTER — Ambulatory Visit: Payer: Medicare HMO | Admitting: Vascular Surgery

## 2012-11-05 ENCOUNTER — Other Ambulatory Visit (HOSPITAL_COMMUNITY): Payer: Medicare HMO

## 2012-11-05 ENCOUNTER — Encounter (HOSPITAL_COMMUNITY): Payer: Medicare HMO

## 2012-11-08 ENCOUNTER — Other Ambulatory Visit: Payer: Self-pay | Admitting: Internal Medicine

## 2012-11-08 NOTE — Telephone Encounter (Signed)
Rx was sent to pharmacy electronically. 

## 2012-11-16 ENCOUNTER — Ambulatory Visit: Payer: Medicare HMO | Admitting: Vascular Surgery

## 2012-11-16 ENCOUNTER — Encounter (HOSPITAL_COMMUNITY): Payer: Commercial Managed Care - HMO

## 2012-11-16 ENCOUNTER — Encounter (HOSPITAL_COMMUNITY): Payer: Medicare HMO

## 2012-11-16 ENCOUNTER — Other Ambulatory Visit (HOSPITAL_COMMUNITY): Payer: Medicare HMO

## 2012-11-23 ENCOUNTER — Encounter (HOSPITAL_COMMUNITY)
Admission: RE | Admit: 2012-11-23 | Discharge: 2012-11-23 | Disposition: A | Discharge status: Home / Self Care | Payer: Medicare HMO | Source: Ambulatory Visit | Attending: Internal Medicine | Admitting: Internal Medicine

## 2012-11-23 VITALS — BP 158/71 | HR 61 | Temp 98.5°F | Resp 20

## 2012-11-23 DIAGNOSIS — N189 Chronic kidney disease, unspecified: Secondary | ICD-10-CM | POA: Insufficient documentation

## 2012-11-23 LAB — RENAL FUNCTION PANEL
Albumin: 2.8 g/dL — ABNORMAL LOW (ref 3.5–5.2)
BUN: 87 mg/dL — ABNORMAL HIGH (ref 6–23)
Calcium: 9 mg/dL (ref 8.4–10.5)
Creatinine, Ser: 6.76 mg/dL — ABNORMAL HIGH (ref 0.50–1.10)
GFR calc Af Amer: 6 mL/min — ABNORMAL LOW (ref >90)
GFR calc non Af Amer: 5 mL/min — ABNORMAL LOW (ref >90)
Phosphorus: 4.8 mg/dL — ABNORMAL HIGH (ref 2.3–4.6)
Potassium: 3.5 mEq/L (ref 3.5–5.1)
Sodium: 137 mEq/L (ref 135–145)

## 2012-11-23 LAB — IRON AND TIBC
Saturation Ratios: 32 % (ref 20–55)
TIBC: 189 ug/dL — ABNORMAL LOW (ref 250–470)

## 2012-11-23 LAB — FERRITIN: Ferritin: 224 ng/mL (ref 10–291)

## 2012-11-23 LAB — POCT HEMOGLOBIN-HEMACUE: Hemoglobin: 10.2 g/dL — ABNORMAL LOW (ref 12.0–15.0)

## 2012-11-23 MED ORDER — DARBEPOETIN ALFA-POLYSORBATE 40 MCG/0.4ML IJ SOLN
40.0000 ug | INTRAMUSCULAR | Status: DC
Start: 2012-11-23 — End: 2012-11-24
  Administered 2012-11-23: 40 ug via SUBCUTANEOUS

## 2012-11-23 MED ORDER — DARBEPOETIN ALFA-POLYSORBATE 40 MCG/0.4ML IJ SOLN
INTRAMUSCULAR | Status: AC
  Administered 2012-11-23: 40 ug via SUBCUTANEOUS
  Filled 2012-11-23: qty 0.4

## 2012-11-29 ENCOUNTER — Telehealth: Payer: Self-pay | Admitting: Oncology

## 2012-11-29 NOTE — Telephone Encounter (Signed)
wanted to cx appt and call back to r/s

## 2012-11-30 ENCOUNTER — Other Ambulatory Visit: Payer: Medicare HMO | Admitting: Lab

## 2012-11-30 ENCOUNTER — Ambulatory Visit: Payer: Medicare HMO | Admitting: Oncology

## 2012-12-15 ENCOUNTER — Encounter (HOSPITAL_COMMUNITY): Payer: Commercial Managed Care - HMO

## 2012-12-21 ENCOUNTER — Encounter (HOSPITAL_COMMUNITY): Payer: Medicare HMO

## 2012-12-22 ENCOUNTER — Encounter (HOSPITAL_COMMUNITY): Payer: Medicare HMO

## 2012-12-22 ENCOUNTER — Encounter: Payer: Self-pay | Admitting: Vascular Surgery

## 2012-12-23 ENCOUNTER — Ambulatory Visit (INDEPENDENT_AMBULATORY_CARE_PROVIDER_SITE_OTHER)
Admission: RE | Admit: 2012-12-23 | Discharge: 2012-12-23 | Disposition: A | Discharge status: Home / Self Care | Payer: Medicare HMO | Source: Ambulatory Visit | Attending: Vascular Surgery | Admitting: Vascular Surgery

## 2012-12-23 ENCOUNTER — Ambulatory Visit (HOSPITAL_COMMUNITY)
Admission: RE | Admit: 2012-12-23 | Discharge: 2012-12-23 | Disposition: A | Discharge status: Home / Self Care | Payer: Medicare HMO | Source: Ambulatory Visit | Attending: Vascular Surgery | Admitting: Vascular Surgery

## 2012-12-23 ENCOUNTER — Encounter (HOSPITAL_COMMUNITY): Payer: Commercial Managed Care - HMO

## 2012-12-23 ENCOUNTER — Ambulatory Visit (INDEPENDENT_AMBULATORY_CARE_PROVIDER_SITE_OTHER): Payer: Medicare HMO | Admitting: Vascular Surgery

## 2012-12-23 ENCOUNTER — Encounter: Payer: Self-pay | Admitting: Vascular Surgery

## 2012-12-23 ENCOUNTER — Other Ambulatory Visit: Payer: Self-pay

## 2012-12-23 VITALS — BP 228/88 | HR 61 | Ht 62.0 in | Wt 171.0 lb

## 2012-12-23 DIAGNOSIS — N186 End stage renal disease: Secondary | ICD-10-CM

## 2012-12-23 DIAGNOSIS — N184 Chronic kidney disease, stage 4 (severe): Secondary | ICD-10-CM | POA: Insufficient documentation

## 2012-12-23 DIAGNOSIS — Z01810 Encounter for preprocedural cardiovascular examination: Secondary | ICD-10-CM | POA: Insufficient documentation

## 2012-12-23 NOTE — Progress Notes (Signed)
[facility] [place] HISTORY AND PHYSICAL   History of Present Illness:  Patient is a 79 y.o. year old female who presents for placement of a permanent hemodialysis access. The patient is left handed .  The patient is not currently on hemodialysis.  The cause of renal failure is thought to be secondary to hypertension.  The patient is referred by Dr. Hyman Hopes. Other chronic medical problems include anemia, hyperlipidemia, stage V kidney disease. These are all currently stable.  Past Medical History  Diagnosis Date  . Anemia   . Hypertension   . Hyperlipidemia   . Chronic kidney disease     Past Surgical History  Procedure Laterality Date  . Cataract extraction      06/07/10     Social History History  Substance Use Topics  . Smoking status: Never Smoker   . Smokeless tobacco: Never Used  . Alcohol Use: No    Family History Family History  Problem Relation Age of Onset  . Hyperlipidemia Other   . Hypertension Other   . Kidney disease Other   . Diabetes Other   . Hypertension Mother   . Diabetes Daughter   . Hypertension Daughter   . Cancer Son     Allergies  No Known Allergies   Current Outpatient Prescriptions  Medication Sig Dispense Refill  . aspirin (BAYER ASPIRIN) 325 MG tablet Take 325 mg by mouth daily.        Marland Kitchen CALCITRIOL PO Take by mouth. Monday, Wednesday, Friday      . ciprofloxacin (CIPRO) 500 MG tablet Take 1 tablet (500 mg total) by mouth 2 (two) times daily.  20 tablet  1  . cloNIDine (CATAPRES) 0.1 MG tablet Take 0.1 mg by mouth 2 (two) times daily.        . diazepam (VALIUM) 5 MG tablet Take 1 tablet (5 mg total) by mouth every 12 (twelve) hours as needed for anxiety.  60 tablet  5  . doxazosin (CARDURA) 2 MG tablet Take 2 mg by mouth 2 (two) times daily.        . furosemide (LASIX) 40 MG tablet TAKE 1 TABLET DAILY  90 tablet  0  . labetalol (NORMODYNE) 200 MG tablet Take 200 mg by mouth 3 (three) times daily.       Marland Kitchen  levothyroxine (SYNTHROID, LEVOTHROID) 25 MCG tablet Take 1 tablet (25 mcg total) by mouth daily before breakfast.  90 tablet  1  . Multiple Vitamins-Calcium (ONE-A-DAY WOMENS PO) Take by mouth.        . rosuvastatin (CRESTOR) 10 MG tablet Take 10 mg by mouth at bedtime.       No current facility-administered medications for this visit.    ROS:   General:  No weight loss, Fever, chills  HEENT: No recent headaches, no nasal bleeding, no visual changes, no sore throat  Neurologic: No dizziness, blackouts, seizures. No recent symptoms of stroke or mini- stroke. No recent episodes of slurred speech, or temporary blindness.  Cardiac: No recent episodes of chest pain/pressure, no shortness of breath at rest.  No shortness of breath with exertion.  Denies history of atrial fibrillation or irregular heartbeat  Vascular: No history of rest pain in feet.  No history of claudication.  No history of non-healing ulcer, No history of DVT   Pulmonary: No home oxygen, no productive cough, no hemoptysis,  No asthma or wheezing  Musculoskeletal:  [ ]  Arthritis, [ ]  Low back pain,  [ ]  Joint  pain  Hematologic:No history of hypercoagulable state.  No history of easy bleeding.  No history of anemia  Gastrointestinal: No hematochezia or melena,  No gastroesophageal reflux, no trouble swallowing, recent diarrhea with stool incontinence  Urinary: [x chronic Kidney disease, [ ]  on HD - [ ]  MWF or [ ]  TTHS, [ ]  Burning with urination, [ ]  Frequent urination, [ ]  Difficulty urinating; recent urinary incontinence  Skin: No rashes  Psychological: No history of anxiety,  No history of depression   Physical Examination  Filed Vitals:   12/23/12 1417  BP: 228/88  Pulse: 61  Height: 5\' 2"  (1.575 m)  Weight: 171 lb (77.565 kg)  SpO2: 99%    Body mass index is 31.27 kg/(m^2).  General:  Alert and oriented, no acute distress HEENT: Normal Neck: No bruit or JVD Pulmonary: Clear to auscultation  bilaterally Cardiac: Regular Rate and Rhythm with 3/6 systolic murmur Gastrointestinal: Soft, non-tender, non-distended Skin: No rash Extremity Pulses:  2+ radial, brachial pulses bilaterally Musculoskeletal: No deformity or edema, sitting in a wheelchair  Neurologic: Upper and lower extremity motor 5/5 and symmetric  DATA: Patient had a duplex of her upper extremity arterial and venous system today. Cephalic vein is less than 3 mm throughout its entire course both upper extremities. The basilic vein on the right side is fairly small. The left basilic vein is 3-5 mm in diameter from the antecubital level. Radial artery is less than 2 mm bilaterally. Brachial artery is a 3-5 mm in diameter. Waveforms triphasic.   ASSESSMENT: Patient has a reasonable left basilic vein for creation of a fistula.   PLAN:  Left basilic vein fistula versus AV graft placement on 01/11/2013. Risks benefits possible complications and procedure details were explained the patient today including but not limited to bleeding infection graft thrombosis non-maturation of fistula need for additional procedures. She understands and agrees to proceed.  Fabienne Bruns, MD [facility] [place] Office: [phone] Pager: [phone]

## 2012-12-27 ENCOUNTER — Encounter (HOSPITAL_COMMUNITY): Payer: Self-pay | Admitting: Pharmacy Technician

## 2012-12-28 ENCOUNTER — Ambulatory Visit (HOSPITAL_COMMUNITY)
Admission: RE | Admit: 2012-12-28 | Discharge: 2012-12-28 | Disposition: A | Discharge status: Home / Self Care | Payer: Medicare HMO | Source: Ambulatory Visit | Attending: Anesthesiology | Admitting: Anesthesiology

## 2012-12-28 ENCOUNTER — Encounter (HOSPITAL_COMMUNITY)
Admission: RE | Admit: 2012-12-28 | Discharge: 2012-12-28 | Disposition: A | Discharge status: Home / Self Care | Payer: Medicare HMO | Source: Ambulatory Visit | Attending: Vascular Surgery | Admitting: Vascular Surgery

## 2012-12-28 ENCOUNTER — Encounter (HOSPITAL_COMMUNITY)
Admission: RE | Admit: 2012-12-28 | Discharge: 2012-12-28 | Disposition: A | Discharge status: Home / Self Care | Payer: Medicare HMO | Source: Ambulatory Visit | Attending: Internal Medicine | Admitting: Internal Medicine

## 2012-12-28 ENCOUNTER — Encounter (HOSPITAL_COMMUNITY): Payer: Self-pay

## 2012-12-28 VITALS — BP 161/95 | HR 57 | Temp 97.9°F | Resp 20

## 2012-12-28 DIAGNOSIS — N189 Chronic kidney disease, unspecified: Secondary | ICD-10-CM | POA: Insufficient documentation

## 2012-12-28 DIAGNOSIS — M47814 Spondylosis without myelopathy or radiculopathy, thoracic region: Secondary | ICD-10-CM | POA: Insufficient documentation

## 2012-12-28 DIAGNOSIS — Z01810 Encounter for preprocedural cardiovascular examination: Secondary | ICD-10-CM | POA: Insufficient documentation

## 2012-12-28 DIAGNOSIS — Z01818 Encounter for other preprocedural examination: Secondary | ICD-10-CM | POA: Insufficient documentation

## 2012-12-28 DIAGNOSIS — I517 Cardiomegaly: Secondary | ICD-10-CM | POA: Insufficient documentation

## 2012-12-28 HISTORY — DX: Anxiety disorder, unspecified: F41.9

## 2012-12-28 HISTORY — DX: End stage renal disease: N18.6

## 2012-12-28 HISTORY — DX: Functional urinary incontinence: R39.81

## 2012-12-28 HISTORY — DX: Hypothyroidism, unspecified: E03.9

## 2012-12-28 LAB — RENAL FUNCTION PANEL
Albumin: 2.5 g/dL — ABNORMAL LOW (ref 3.5–5.2)
CO2: 20 mEq/L (ref 19–32)
Calcium: 9 mg/dL (ref 8.4–10.5)
Chloride: 103 mEq/L (ref 96–112)
Creatinine, Ser: 6.75 mg/dL — ABNORMAL HIGH (ref 0.50–1.10)
GFR calc Af Amer: 6 mL/min — ABNORMAL LOW (ref >90)
GFR calc non Af Amer: 5 mL/min — ABNORMAL LOW (ref >90)
Potassium: 3.7 mEq/L (ref 3.5–5.1)
Sodium: 138 mEq/L (ref 135–145)

## 2012-12-28 LAB — IRON AND TIBC
Saturation Ratios: 27 % (ref 20–55)
TIBC: 211 ug/dL — ABNORMAL LOW (ref 250–470)

## 2012-12-28 LAB — POCT HEMOGLOBIN-HEMACUE: Hemoglobin: 8.6 g/dL — ABNORMAL LOW (ref 12.0–15.0)

## 2012-12-28 MED ORDER — DARBEPOETIN ALFA-POLYSORBATE 40 MCG/0.4ML IJ SOLN
40.0000 ug | INTRAMUSCULAR | Status: DC
  Administered 2012-12-28: 40 ug via SUBCUTANEOUS

## 2012-12-28 MED ORDER — DARBEPOETIN ALFA-POLYSORBATE 40 MCG/0.4ML IJ SOLN
INTRAMUSCULAR | Status: AC
  Filled 2012-12-28: qty 0.4

## 2012-12-28 NOTE — Pre-Procedure Instructions (Signed)
MOHINI HEATHCOCK  12/28/2012   Your procedure is scheduled on:  Tuesday, December30  Report to [facility] Short Stay at [facility]" at 0830 AM.  Call this number if you have problems the morning of surgery: [phone]   Remember:   Do not eat food or drink liquids after midnight.Monday night   Take these medicines the morning of surgery with A SIP OF WATER: Clonidine, Diazepam if needed,Doxazosin (Cardura), Labetolol (Normodyne)   Do not wear jewelry, make-up or nail polish.  Do not wear lotions, powders, or perfumes. You may wear deodorant.  Do not shave 48 hours prior to surgery. Men may shave face and neck.  Do not bring valuables to the hospital.  [facility] is not responsible   for any belongings or valuables.               Contacts, dentures or bridgework may not be worn into surgery.  Leave suitcase in the car. After surgery it may be brought to your room.  For patients admitted to the hospital, discharge time is determined by your    treatment team.               Patients discharged the day of surgery will not be allowed to drive  home.  Name and phone number of your driver:   Special Instructions: Shower using CHG 2 nights before surgery and the night before surgery.  If you shower the day of surgery use CHG.  Use special wash - you have one bottle of CHG for all showers.  You should use approximately 1/3 of the bottle for each shower.   Please read over the following fact sheets that you were given: Pain Booklet, Coughing and Deep Breathing and Surgical Site Infection Prevention

## 2012-12-31 ENCOUNTER — Telehealth: Payer: Self-pay | Admitting: Internal Medicine

## 2012-12-31 NOTE — Telephone Encounter (Signed)
Would like some samples of Crestor 20 mg please. °

## 2012-12-31 NOTE — Telephone Encounter (Signed)
Returned call.  Pt verified x 2 w/ [facility] ([facility]).  Informed no samples available.  Verbalized understanding and agreed w/ plan.

## 2013-01-10 ENCOUNTER — Encounter (HOSPITAL_COMMUNITY): Payer: Self-pay | Admitting: Anesthesiology

## 2013-01-10 ENCOUNTER — Telehealth: Payer: Self-pay

## 2013-01-10 ENCOUNTER — Encounter (HOSPITAL_COMMUNITY): Payer: Commercial Managed Care - HMO

## 2013-01-10 MED ORDER — CEFUROXIME SODIUM 1.5 G IJ SOLR
1.5000 g | INTRAMUSCULAR | Status: DC
  Filled 2013-01-10: qty 1.5

## 2013-01-10 NOTE — Telephone Encounter (Signed)
rec'd phone call from pt's daughter, Ernestina Patches, stating pt. has changed her mind re: surgery tomorrow, and wants to cancel at this time.  Stated she wants more time to think about it, and will call back when ready to schedule.

## 2013-01-11 ENCOUNTER — Ambulatory Visit: Payer: Medicare HMO | Admitting: Podiatry

## 2013-01-11 ENCOUNTER — Other Ambulatory Visit (HOSPITAL_COMMUNITY): Payer: Medicare HMO

## 2013-01-11 ENCOUNTER — Encounter (HOSPITAL_COMMUNITY): Admission: RE | Payer: Self-pay | Source: Ambulatory Visit

## 2013-01-11 ENCOUNTER — Encounter (HOSPITAL_COMMUNITY): Payer: Medicare HMO

## 2013-01-11 ENCOUNTER — Ambulatory Visit: Payer: Medicare HMO | Admitting: Vascular Surgery

## 2013-01-11 ENCOUNTER — Ambulatory Visit (HOSPITAL_COMMUNITY): Admission: RE | Admit: 2013-01-11 | Payer: Medicare HMO | Source: Ambulatory Visit | Admitting: Vascular Surgery

## 2013-01-11 SURGERY — TRANSPOSITION, VEIN, BASILIC
Anesthesia: Monitor Anesthesia Care | Site: Arm Upper | Laterality: Left

## 2013-01-16 ENCOUNTER — Encounter: Payer: Self-pay | Admitting: *Deleted

## 2013-01-19 ENCOUNTER — Ambulatory Visit: Payer: Medicare HMO | Admitting: Internal Medicine

## 2013-01-19 ENCOUNTER — Encounter (HOSPITAL_COMMUNITY): Payer: Medicare HMO

## 2013-01-24 ENCOUNTER — Other Ambulatory Visit (HOSPITAL_COMMUNITY): Payer: Self-pay | Admitting: *Deleted

## 2013-01-25 ENCOUNTER — Encounter (HOSPITAL_COMMUNITY): Payer: Medicare HMO

## 2013-01-25 ENCOUNTER — Ambulatory Visit: Payer: Medicare HMO | Admitting: Internal Medicine

## 2013-01-27 ENCOUNTER — Encounter: Payer: Self-pay | Admitting: Internal Medicine

## 2013-02-01 ENCOUNTER — Ambulatory Visit: Payer: Medicare HMO | Admitting: Internal Medicine

## 2013-02-03 ENCOUNTER — Encounter (HOSPITAL_COMMUNITY): Payer: Medicare HMO

## 2013-02-11 ENCOUNTER — Encounter (HOSPITAL_COMMUNITY): Payer: Medicare HMO

## 2013-02-15 ENCOUNTER — Encounter (HOSPITAL_COMMUNITY): Payer: Commercial Managed Care - HMO

## 2013-02-23 ENCOUNTER — Inpatient Hospital Stay (HOSPITAL_COMMUNITY): Admission: RE | Admit: 2013-02-23 | Payer: Commercial Managed Care - HMO | Source: Ambulatory Visit

## 2013-02-23 ENCOUNTER — Ambulatory Visit: Payer: Medicare HMO | Admitting: Internal Medicine

## 2013-02-24 ENCOUNTER — Other Ambulatory Visit: Payer: Self-pay | Admitting: Internal Medicine

## 2013-03-09 ENCOUNTER — Telehealth: Payer: Self-pay | Admitting: Internal Medicine

## 2013-03-09 NOTE — Telephone Encounter (Signed)
Pt is not able to walk-message for the wrong doctor

## 2013-04-10 ENCOUNTER — Other Ambulatory Visit: Payer: Self-pay | Admitting: Internal Medicine

## 2013-04-12 ENCOUNTER — Encounter (HOSPITAL_COMMUNITY)
Admission: RE | Admit: 2013-04-12 | Discharge: 2013-04-12 | Disposition: A | Discharge status: Home / Self Care | Payer: Medicare HMO | Source: Ambulatory Visit | Attending: Nephrology | Admitting: Nephrology

## 2013-04-12 VITALS — BP 179/80 | Temp 98.6°F | Resp 20

## 2013-04-12 DIAGNOSIS — N189 Chronic kidney disease, unspecified: Secondary | ICD-10-CM

## 2013-04-12 DIAGNOSIS — N184 Chronic kidney disease, stage 4 (severe): Secondary | ICD-10-CM | POA: Diagnosis not present

## 2013-04-12 DIAGNOSIS — D638 Anemia in other chronic diseases classified elsewhere: Secondary | ICD-10-CM | POA: Diagnosis not present

## 2013-04-12 LAB — RENAL FUNCTION PANEL
Albumin: 2.8 g/dL — ABNORMAL LOW (ref 3.5–5.2)
BUN: 88 mg/dL — AB (ref 6–23)
CO2: 16 mEq/L — ABNORMAL LOW (ref 19–32)
Calcium: 9.3 mg/dL (ref 8.4–10.5)
Chloride: 104 mEq/L (ref 96–112)
Creatinine, Ser: 6.64 mg/dL — ABNORMAL HIGH (ref 0.50–1.10)
GFR calc Af Amer: 6 mL/min — ABNORMAL LOW (ref >90)
GFR calc non Af Amer: 5 mL/min — ABNORMAL LOW (ref >90)
GLUCOSE: 95 mg/dL (ref 70–99)
PHOSPHORUS: 4.5 mg/dL (ref 2.3–4.6)
Potassium: 4 mEq/L (ref 3.7–5.3)
Sodium: 140 mEq/L (ref 137–147)

## 2013-04-12 LAB — IRON AND TIBC
Iron: 62 ug/dL (ref 42–135)
Saturation Ratios: 32 % (ref 20–55)
TIBC: 192 ug/dL — ABNORMAL LOW (ref 250–470)
UIBC: 130 ug/dL (ref 125–400)

## 2013-04-12 LAB — FERRITIN: FERRITIN: 522 ng/mL — AB (ref 10–291)

## 2013-04-12 LAB — POCT HEMOGLOBIN-HEMACUE: Hemoglobin: 7.6 g/dL — ABNORMAL LOW (ref 12.0–15.0)

## 2013-04-12 MED ORDER — DARBEPOETIN ALFA-POLYSORBATE 40 MCG/0.4ML IJ SOLN: 40.0000 ug | INTRAMUSCULAR | Status: DC

## 2013-04-12 MED ORDER — DARBEPOETIN ALFA-POLYSORBATE 40 MCG/0.4ML IJ SOLN
INTRAMUSCULAR | Status: AC
  Administered 2013-04-12: 40 ug via SUBCUTANEOUS
  Filled 2013-04-12: qty 0.4

## 2013-04-19 ENCOUNTER — Ambulatory Visit: Payer: Medicare HMO | Admitting: Podiatry

## 2013-05-05 ENCOUNTER — Telehealth: Payer: Self-pay | Admitting: Oncology

## 2013-05-05 NOTE — Telephone Encounter (Signed)
called and r/s appt , lab and MD for May 2015 per pt rqst

## 2013-06-02 ENCOUNTER — Telehealth: Payer: Self-pay | Admitting: Oncology

## 2013-06-02 ENCOUNTER — Other Ambulatory Visit: Payer: Commercial Managed Care - HMO

## 2013-06-02 ENCOUNTER — Ambulatory Visit: Payer: Commercial Managed Care - HMO | Admitting: Oncology

## 2013-06-02 NOTE — Telephone Encounter (Signed)
s.w.l pt daughter and r/s appt due rain...pt aware of new d.t

## 2013-06-13 ENCOUNTER — Ambulatory Visit: Payer: Commercial Managed Care - HMO | Admitting: Internal Medicine

## 2013-06-16 ENCOUNTER — Other Ambulatory Visit: Payer: Commercial Managed Care - HMO

## 2013-06-16 ENCOUNTER — Ambulatory Visit: Payer: Commercial Managed Care - HMO | Admitting: Oncology

## 2013-06-20 ENCOUNTER — Ambulatory Visit: Payer: Commercial Managed Care - HMO | Admitting: Internal Medicine

## 2013-06-24 ENCOUNTER — Ambulatory Visit: Payer: Commercial Managed Care - HMO | Admitting: Internal Medicine

## 2013-06-28 ENCOUNTER — Ambulatory Visit: Payer: Commercial Managed Care - HMO | Admitting: Podiatry

## 2013-07-01 ENCOUNTER — Emergency Department (HOSPITAL_COMMUNITY): Payer: Medicare HMO

## 2013-07-01 ENCOUNTER — Encounter (HOSPITAL_COMMUNITY): Payer: Self-pay | Admitting: Emergency Medicine

## 2013-07-01 ENCOUNTER — Inpatient Hospital Stay (HOSPITAL_COMMUNITY)
Admission: EM | Admit: 2013-07-01 | Discharge: 2013-07-08 | DRG: 673 | Disposition: A | Discharge status: Home Health | Payer: Medicare HMO | Attending: Internal Medicine | Admitting: Internal Medicine

## 2013-07-01 DIAGNOSIS — Z79899 Other long term (current) drug therapy: Secondary | ICD-10-CM

## 2013-07-01 DIAGNOSIS — Z91199 Patient's noncompliance with other medical treatment and regimen due to unspecified reason: Secondary | ICD-10-CM

## 2013-07-01 DIAGNOSIS — D631 Anemia in chronic kidney disease: Secondary | ICD-10-CM | POA: Diagnosis present

## 2013-07-01 DIAGNOSIS — N19 Unspecified kidney failure: Secondary | ICD-10-CM

## 2013-07-01 DIAGNOSIS — R799 Abnormal finding of blood chemistry, unspecified: Secondary | ICD-10-CM | POA: Diagnosis present

## 2013-07-01 DIAGNOSIS — J189 Pneumonia, unspecified organism: Secondary | ICD-10-CM | POA: Diagnosis present

## 2013-07-01 DIAGNOSIS — Z7982 Long term (current) use of aspirin: Secondary | ICD-10-CM

## 2013-07-01 DIAGNOSIS — I5031 Acute diastolic (congestive) heart failure: Secondary | ICD-10-CM | POA: Diagnosis present

## 2013-07-01 DIAGNOSIS — E872 Acidosis, unspecified: Secondary | ICD-10-CM

## 2013-07-01 DIAGNOSIS — F419 Anxiety disorder, unspecified: Secondary | ICD-10-CM

## 2013-07-01 DIAGNOSIS — Z9115 Patient's noncompliance with renal dialysis: Secondary | ICD-10-CM

## 2013-07-01 DIAGNOSIS — N179 Acute kidney failure, unspecified: Principal | ICD-10-CM | POA: Diagnosis present

## 2013-07-01 DIAGNOSIS — I10 Essential (primary) hypertension: Secondary | ICD-10-CM

## 2013-07-01 DIAGNOSIS — N186 End stage renal disease: Secondary | ICD-10-CM | POA: Diagnosis present

## 2013-07-01 DIAGNOSIS — I120 Hypertensive chronic kidney disease with stage 5 chronic kidney disease or end stage renal disease: Secondary | ICD-10-CM | POA: Diagnosis present

## 2013-07-01 DIAGNOSIS — R350 Frequency of micturition: Secondary | ICD-10-CM

## 2013-07-01 DIAGNOSIS — E875 Hyperkalemia: Secondary | ICD-10-CM | POA: Diagnosis present

## 2013-07-01 DIAGNOSIS — G9341 Metabolic encephalopathy: Secondary | ICD-10-CM | POA: Diagnosis present

## 2013-07-01 DIAGNOSIS — N184 Chronic kidney disease, stage 4 (severe): Secondary | ICD-10-CM | POA: Diagnosis present

## 2013-07-01 DIAGNOSIS — F411 Generalized anxiety disorder: Secondary | ICD-10-CM | POA: Diagnosis present

## 2013-07-01 DIAGNOSIS — N189 Chronic kidney disease, unspecified: Secondary | ICD-10-CM

## 2013-07-01 DIAGNOSIS — N2581 Secondary hyperparathyroidism of renal origin: Secondary | ICD-10-CM | POA: Diagnosis present

## 2013-07-01 DIAGNOSIS — I509 Heart failure, unspecified: Secondary | ICD-10-CM | POA: Diagnosis present

## 2013-07-01 DIAGNOSIS — E785 Hyperlipidemia, unspecified: Secondary | ICD-10-CM | POA: Diagnosis present

## 2013-07-01 DIAGNOSIS — E039 Hypothyroidism, unspecified: Secondary | ICD-10-CM | POA: Diagnosis present

## 2013-07-01 DIAGNOSIS — E669 Obesity, unspecified: Secondary | ICD-10-CM | POA: Diagnosis present

## 2013-07-01 DIAGNOSIS — W19XXXA Unspecified fall, initial encounter: Secondary | ICD-10-CM | POA: Diagnosis present

## 2013-07-01 DIAGNOSIS — Z9119 Patient's noncompliance with other medical treatment and regimen: Secondary | ICD-10-CM

## 2013-07-01 DIAGNOSIS — N039 Chronic nephritic syndrome with unspecified morphologic changes: Secondary | ICD-10-CM

## 2013-07-01 DIAGNOSIS — R778 Other specified abnormalities of plasma proteins: Secondary | ICD-10-CM

## 2013-07-01 DIAGNOSIS — S0510XA Contusion of eyeball and orbital tissues, unspecified eye, initial encounter: Secondary | ICD-10-CM | POA: Diagnosis present

## 2013-07-01 DIAGNOSIS — Z6831 Body mass index (BMI) 31.0-31.9, adult: Secondary | ICD-10-CM

## 2013-07-01 DIAGNOSIS — Z91158 Patient's noncompliance with renal dialysis for other reason: Secondary | ICD-10-CM

## 2013-07-01 DIAGNOSIS — N185 Chronic kidney disease, stage 5: Secondary | ICD-10-CM

## 2013-07-01 DIAGNOSIS — I160 Hypertensive urgency: Secondary | ICD-10-CM | POA: Diagnosis present

## 2013-07-01 DIAGNOSIS — R7989 Other specified abnormal findings of blood chemistry: Secondary | ICD-10-CM

## 2013-07-01 DIAGNOSIS — I214 Non-ST elevation (NSTEMI) myocardial infarction: Secondary | ICD-10-CM | POA: Diagnosis present

## 2013-07-01 LAB — CBC
HCT: 24.9 % — ABNORMAL LOW (ref 36.0–46.0)
Hemoglobin: 7.8 g/dL — ABNORMAL LOW (ref 12.0–15.0)
MCH: 26.1 pg (ref 26.0–34.0)
MCHC: 31.3 g/dL (ref 30.0–36.0)
MCV: 83.3 fL (ref 78.0–100.0)
Platelets: 222 10*3/uL (ref 150–400)
RBC: 2.99 MIL/uL — ABNORMAL LOW (ref 3.87–5.11)
RDW: 13.2 % (ref 11.5–15.5)
WBC: 11.7 10*3/uL — AB (ref 4.0–10.5)

## 2013-07-01 LAB — PRO B NATRIURETIC PEPTIDE

## 2013-07-01 LAB — URINALYSIS, ROUTINE W REFLEX MICROSCOPIC
Bilirubin Urine: NEGATIVE
Glucose, UA: NEGATIVE mg/dL
KETONES UR: 15 mg/dL — AB
Nitrite: NEGATIVE
Protein, ur: >300 mg/dL — AB
Specific Gravity, Urine: 1.016 (ref 1.005–1.030)
UROBILINOGEN UA: 0.2 mg/dL (ref 0.0–1.0)
pH: 7 (ref 5.0–8.0)

## 2013-07-01 LAB — URINE MICROSCOPIC-ADD ON

## 2013-07-01 LAB — BASIC METABOLIC PANEL
BUN: 81 mg/dL — ABNORMAL HIGH (ref 6–23)
CHLORIDE: 103 meq/L (ref 96–112)
CO2: 16 mEq/L — ABNORMAL LOW (ref 19–32)
CREATININE: 7.96 mg/dL — AB (ref 0.50–1.10)
Calcium: 9.5 mg/dL (ref 8.4–10.5)
GFR, EST AFRICAN AMERICAN: 5 mL/min — AB (ref >90)
GFR, EST NON AFRICAN AMERICAN: 4 mL/min — AB (ref >90)
Glucose, Bld: 104 mg/dL — ABNORMAL HIGH (ref 70–99)
Potassium: 5.9 mEq/L — ABNORMAL HIGH (ref 3.7–5.3)
Sodium: 142 mEq/L (ref 137–147)

## 2013-07-01 LAB — HEPATIC FUNCTION PANEL
ALK PHOS: 61 U/L (ref 39–117)
ALT: 9 U/L (ref 0–35)
AST: 29 U/L (ref 0–37)
Albumin: 2.9 g/dL — ABNORMAL LOW (ref 3.5–5.2)
BILIRUBIN DIRECT: 0.2 mg/dL (ref 0.0–0.3)
Indirect Bilirubin: 0.6 mg/dL (ref 0.3–0.9)
Total Bilirubin: 0.8 mg/dL (ref 0.3–1.2)
Total Protein: 6.5 g/dL (ref 6.0–8.3)

## 2013-07-01 LAB — TROPONIN I: Troponin I: 0.65 ng/mL (ref <0.30)

## 2013-07-01 LAB — LIPASE, BLOOD: LIPASE: 29 U/L (ref 11–59)

## 2013-07-01 LAB — ABO/RH: ABO/RH(D): A POS

## 2013-07-01 LAB — I-STAT CG4 LACTIC ACID, ED: LACTIC ACID, VENOUS: 1.67 mmol/L (ref 0.5–2.2)

## 2013-07-01 LAB — CBG MONITORING, ED: GLUCOSE-CAPILLARY: 97 mg/dL (ref 70–99)

## 2013-07-01 MED ORDER — SODIUM CHLORIDE 0.9 % IV SOLN
1.0000 g | Freq: Once | INTRAVENOUS | Status: AC
  Administered 2013-07-02: 1 g via INTRAVENOUS
  Filled 2013-07-01: qty 10

## 2013-07-01 MED ORDER — SODIUM CHLORIDE 0.9 % IV SOLN
INTRAVENOUS | Status: AC
  Administered 2013-07-02: 01:00:00 via INTRAVENOUS

## 2013-07-01 MED ORDER — HEPARIN (PORCINE) IN NACL 100-0.45 UNIT/ML-% IJ SOLN: 14.0000 [IU]/kg/h | Freq: Once | INTRAMUSCULAR | Status: DC

## 2013-07-01 MED ORDER — CLONIDINE HCL 0.1 MG PO TABS
0.1000 mg | ORAL_TABLET | Freq: Once | ORAL | Status: AC
  Administered 2013-07-01: 0.1 mg via ORAL
  Filled 2013-07-01: qty 1

## 2013-07-01 MED ORDER — DEXTROSE 5 % IV SOLN
500.0000 mg | Freq: Once | INTRAVENOUS | Status: AC
  Administered 2013-07-02: 500 mg via INTRAVENOUS
  Filled 2013-07-01: qty 500

## 2013-07-01 MED ORDER — DEXTROSE 5 % IV SOLN
1.0000 g | Freq: Once | INTRAVENOUS | Status: AC
  Administered 2013-07-02: 1 g via INTRAVENOUS
  Filled 2013-07-01: qty 10

## 2013-07-01 MED ORDER — SODIUM BICARBONATE 8.4 % IV SOLN
50.0000 meq | Freq: Once | INTRAVENOUS | Status: AC
  Administered 2013-07-01: 50 meq via INTRAVENOUS
  Filled 2013-07-01: qty 50

## 2013-07-01 MED ORDER — SODIUM CHLORIDE 0.9 % IV SOLN
INTRAVENOUS | Status: DC
  Administered 2013-07-01: 22:00:00 via INTRAVENOUS

## 2013-07-01 MED ORDER — NITROGLYCERIN IN D5W 200-5 MCG/ML-% IV SOLN
2.0000 ug/min | Freq: Once | INTRAVENOUS | Status: AC
  Administered 2013-07-01: 5 ug/min via INTRAVENOUS
  Filled 2013-07-01: qty 250

## 2013-07-01 MED ORDER — IBUPROFEN 800 MG PO TABS
800.0000 mg | ORAL_TABLET | Freq: Once | ORAL | Status: AC
  Administered 2013-07-01: 800 mg via ORAL
  Filled 2013-07-01: qty 1

## 2013-07-01 MED ORDER — HEPARIN SODIUM (PORCINE) 5000 UNIT/ML IJ SOLN: 60.0000 [IU]/kg | Freq: Once | INTRAMUSCULAR | Status: DC

## 2013-07-01 NOTE — ED Notes (Signed)
Dr. Arnoldo Morale (hospitalist) at the bedside.

## 2013-07-01 NOTE — ED Notes (Signed)
Reported to Dr. Rogene Houston that patient's BP is currently 208/132, is being brought in for a fall, and is having a difficult time completing sentences.  Non-compliant with meds for the past few days.  Currently a CT has been ordered.  MD acknowledges, and allows for patient to travel to [place] prior to being seen.

## 2013-07-01 NOTE — ED Notes (Signed)
Patient still off the unit for testing 

## 2013-07-01 NOTE — ED Notes (Signed)
[employer]. Daughter [phone].

## 2013-07-01 NOTE — ED Provider Notes (Signed)
CSN: [id number]     Arrival date & time 07/01/13  2036 History   First MD Initiated Contact with Patient 07/01/13 2058     Chief Complaint  Patient presents with  . Fall     (Consider location/radiation/quality/duration/timing/severity/associated sxs/prior Treatment) The history is provided by the patient, the EMS personnel and a relative. The history is limited by the condition of the patient.   80 year old female. Brought in by EMS patient's daughter called to have her brought in. Patient with some confusion but alert level V caveat applies to her history. History as per the daughter is that the patient had a fall a week ago striking the right side of her face and 4 head. Today patient a little more confused some of the confusion started yesterday. No fall today.    Past Medical History  Diagnosis Date  . Anemia   . Hypertension   . Hyperlipidemia   . Chronic kidney disease   . Hypothyroidism   . End-stage renal disease (ESRD)   . Urinary incontinence, functional   . Anxiety    Past Surgical History  Procedure Laterality Date  . Cataract extraction  06/07/2010  . Transthoracic echocardiogram  10/2009    EF=>55%, mild conc LVH; LA mildly dilated; mild mitral annular calcif, mild MR; mild TR, RVSP 40-69mmHg; AV mildly sclerotic; mild pulm valve regurg; aortic root sclerosis/calcif   . Nm myocar perf wall motion  10/2009    dipyridamole; mild-mod ischemia in apical anterior, basal inferolateral, mid inferoalteral region; post-stress EF 66%; high risk    Family History  Problem Relation Age of Onset  . Hyperlipidemia Other   . Hypertension Other   . Kidney disease Other   . Diabetes Other   . Hypertension Mother   . Diabetes Daughter   . Hypertension Daughter   . Cancer Son   . Cancer Sister    History  Substance Use Topics  . Smoking status: Never Smoker   . Smokeless tobacco: Never Used  . Alcohol Use: No   OB History   Grav Para Term Preterm Abortions TAB SAB Ect  Mult Living                 Review of Systems  Unable to perform ROS  Patient confusion. Review of systems not very accurate. Level V caveat applies.   Allergies  Norvasc and Zocor  Home Medications   Prior to Admission medications   Medication Sig Start Date End Date Taking? Authorizing [employer] Corpening  acetaminophen (TYLENOL) 500 MG tablet Take 1,000 mg by mouth 2 (two) times daily as needed (pain).   Yes Historical Dusti Tetro, MD  aspirin (BAYER ASPIRIN) 325 MG tablet Take 325 mg by mouth daily.     Yes Historical Devaeh Amadi, MD  calcitRIOL (ROCALTROL) 0.25 MCG capsule Take 0.25 mcg by mouth every Monday, Wednesday, and Friday.    Yes Historical Maranatha Grossi, MD  cloNIDine (CATAPRES) 0.1 MG tablet Take 0.1 mg by mouth daily.    Yes Historical Miasha Emmons, MD  doxazosin (CARDURA) 2 MG tablet Take 4 mg by mouth 2 (two) times daily.    Yes Historical Freja Faro, MD  ferrous sulfate 325 (65 FE) MG tablet Take 650 mg by mouth daily with breakfast.   Yes Historical Skyylar Kopf, MD  furosemide (LASIX) 40 MG tablet Take 1 tablet (40 mg total) by mouth daily. No further refills without an appointment.   Yes Pixie Casino, MD  labetalol (NORMODYNE) 200 MG tablet Take 200 mg by mouth 3 (three)  times daily.    Yes Historical Luvia Orzechowski, MD  Multiple Vitamin (MULTIVITAMIN WITH MINERALS) TABS tablet Take 1 tablet by mouth daily.   Yes Historical Aniken Monestime, MD  rosuvastatin (CRESTOR) 20 MG tablet Take 20 mg by mouth every evening.   Yes Historical Mikias Lanz, MD   BP 202/148  Pulse 79  Temp(Src) 97.9 F (36.6 C) (Rectal)  Resp 29  SpO2 100% Physical Exam  Nursing note and vitals reviewed. Constitutional: She appears well-developed and well-nourished. No distress.  HENT:  Left for head eyebrow area with old abrasion laceration contusion. Almost looks like a skin tear. No active bleeding. No obvious infection. Appears to be about a week old. Swelling to the left eyelid and underneath the left eye at the cheek. With  contusion and ecchymosis.  Eyes: Conjunctivae are normal.  Patient with previous existing decreased vision in the left eye.  Neck: Normal range of motion.  Cardiovascular: Normal rate, regular rhythm and normal heart sounds.   Pulmonary/Chest: Effort normal and breath sounds normal. No respiratory distress.  Abdominal: Soft. Bowel sounds are normal. There is no tenderness.  Musculoskeletal: She exhibits edema. She exhibits no tenderness.  Neurological: She is alert. No cranial nerve deficit. She exhibits normal muscle tone. Coordination normal.  Alert but confused. Not oriented.  Skin: Skin is warm. No rash noted.    ED Course  Procedures (including critical care time) Labs Review Labs Reviewed  CBC - Abnormal; Notable for the following:    WBC 11.7 (*)    RBC 2.99 (*)    Hemoglobin 7.8 (*)    HCT 24.9 (*)    All other components within normal limits  BASIC METABOLIC PANEL - Abnormal; Notable for the following:    Potassium 5.9 (*)    CO2 16 (*)    Glucose, Bld 104 (*)    BUN 81 (*)    Creatinine, Ser 7.96 (*)    GFR calc non Af Amer 4 (*)    GFR calc Af Amer 5 (*)    All other components within normal limits  HEPATIC FUNCTION PANEL - Abnormal; Notable for the following:    Albumin 2.9 (*)    All other components within normal limits  PRO B NATRIURETIC PEPTIDE - Abnormal; Notable for the following:    Pro B Natriuretic peptide (BNP) >70000.0 (*)    All other components within normal limits  TROPONIN I - Abnormal; Notable for the following:    Troponin I 0.65 (*)    All other components within normal limits  CULTURE, BLOOD (ROUTINE X 2)  CULTURE, BLOOD (ROUTINE X 2)  LIPASE, BLOOD  URINALYSIS, ROUTINE W REFLEX MICROSCOPIC  CBG MONITORING, ED  I-STAT CG4 LACTIC ACID, ED  TYPE AND SCREEN  ABO/RH   Results for orders placed during the hospital encounter of 07/01/13  CBC      Result Value Ref Range   WBC 11.7 (*) 4.0 - 10.5 K/uL   RBC 2.99 (*) 3.87 - 5.11 MIL/uL    Hemoglobin 7.8 (*) 12.0 - 15.0 g/dL   HCT 24.9 (*) 36.0 - 46.0 %   MCV 83.3  78.0 - 100.0 fL   MCH 26.1  26.0 - 34.0 pg   MCHC 31.3  30.0 - 36.0 g/dL   RDW 13.2  11.5 - 15.5 %   Platelets 222  150 - 400 K/uL  BASIC METABOLIC PANEL      Result Value Ref Range   Sodium 142  137 - 147 mEq/L  Potassium 5.9 (*) 3.7 - 5.3 mEq/L   Chloride 103  96 - 112 mEq/L   CO2 16 (*) 19 - 32 mEq/L   Glucose, Bld 104 (*) 70 - 99 mg/dL   BUN 81 (*) 6 - 23 mg/dL   Creatinine, Ser 7.96 (*) 0.50 - 1.10 mg/dL   Calcium 9.5  8.4 - 10.5 mg/dL   GFR calc non Af Amer 4 (*) >90 mL/min   GFR calc Af Amer 5 (*) >90 mL/min  HEPATIC FUNCTION PANEL      Result Value Ref Range   Total Protein 6.5  6.0 - 8.3 g/dL   Albumin 2.9 (*) 3.5 - 5.2 g/dL   AST 29  0 - 37 U/L   ALT 9  0 - 35 U/L   Alkaline Phosphatase 61  39 - 117 U/L   Total Bilirubin 0.8  0.3 - 1.2 mg/dL   Bilirubin, Direct 0.2  0.0 - 0.3 mg/dL   Indirect Bilirubin 0.6  0.3 - 0.9 mg/dL  LIPASE, BLOOD      Result Value Ref Range   Lipase 29  11 - 59 U/L  PRO B NATRIURETIC PEPTIDE      Result Value Ref Range   Pro B Natriuretic peptide (BNP) >70000.0 (*) 0 - 450 pg/mL  TROPONIN I      Result Value Ref Range   Troponin I 0.65 (*) <0.30 ng/mL  CBG MONITORING, ED      Result Value Ref Range   Glucose-Capillary 97  70 - 99 mg/dL  TYPE AND SCREEN      Result Value Ref Range   ABO/RH(D) A POS     Antibody Screen NEG     Sample Expiration 07/04/2013    ABO/RH      Result Value Ref Range   ABO/RH(D) A POS       Imaging Review Dg Chest 2 View  07/01/2013   CLINICAL DATA:  Fall  EXAM: CHEST  2 VIEW  COMPARISON:  Radiograph 12/28/2012  FINDINGS: Cardiac silhouette is mildly enlarged. There is a new right pleural effusion. Concern for right lower lobe airspace disease additionally.  IMPRESSION: 1. New right pleural effusion. 2. Right lobe atelectasis versus infiltrate.   Electronically Signed   By: Suzy Bouchard M.D.   On: 07/01/2013 22:40   Ct  Head Wo Contrast  07/01/2013   CLINICAL DATA:  80 year old female with altered mental status following fall. Hypertension.  EXAM: CT HEAD WITHOUT CONTRAST  TECHNIQUE: Contiguous axial images were obtained from the base of the skull through the vertex without intravenous contrast.  COMPARISON:  None.  FINDINGS: Moderate chronic small-vessel white matter ischemic changes are noted.  No acute intracranial abnormalities are identified, including mass lesion or mass effect, hydrocephalus, extra-axial fluid collection, midline shift, hemorrhage, or acute infarction.  The visualized bony calvarium is unremarkable.  Mild left forehead soft tissue swelling is.  IMPRESSION: No evidence of acute intracranial abnormality.  Mild left forehead soft tissue swelling without fracture.  Moderate chronic small-vessel white matter ischemic changes.   Electronically Signed   By: Hassan Rowan M.D.   On: 07/01/2013 21:50   Ct Maxillofacial Wo Cm  07/01/2013   CLINICAL DATA:  Fall, left black eye.  EXAM: CT MAXILLOFACIAL WITHOUT CONTRAST  TECHNIQUE: Multidetector CT imaging of the maxillofacial structures was performed. Multiplanar CT image reconstructions were also generated. A small metallic BB was placed on the right temple in order to reliably differentiate right from left.  COMPARISON:  None.  FINDINGS: Left periorbital/supra or soft tissue swelling with contusion is present. Hyperdensity within the left anterior chamber itself may represent blood. The globes are intact without evidence of acute injury. No retro-orbital hematoma or other pathology. Intraconal and extraconal fat are well well preserved.  The bony orbits are intact without evidence of orbital floor fracture. Zygomatic arches are intact.  Maxilla is intact. No nasal bone fracture. Mandible is intact. Mandibular condyles are normally positioned within the temporomandibular fossa.  Paranasal sinuses are well pneumatized and free of fluid. No mastoid effusion.  A advanced  atrophy noted within the partially visualized right  IMPRESSION: 1. Left periorbital/supraorbital contusion. 2. Intact globes. Hyperdensity within the left anterior chamber suggestive of hemorrhage. Correlation with physical recommended. No retro-orbital hematoma or other pathology. 3. No acute maxillofacial fracture.   Electronically Signed   By: Jeannine Boga M.D.   On: 07/01/2013 22:33     EKG Interpretation   Date/Time:  Friday July 01 2013 20:39:04 EDT Ventricular Rate:  82 PR Interval:  154 QRS Duration: 112 QT Interval:  415 QTC Calculation: 485 R Axis:   -52 Text Interpretation:  Sinus rhythm Left anterior fascicular block Anterior  infarct, age indeterminate No significant change since last tracing  Confirmed by ZACKOWSKI  MD, SCOTT [phone]) on 07/01/2013 8:59:02 PM       CRITICAL CARE Performed by: Fredia Sorrow Total critical care time: 30 Critical care time was exclusive of separately billable procedures and treating other patients. Critical care was necessary to treat or prevent imminent or life-threatening deterioration. Critical care was time spent personally by me on the following activities: development of treatment plan with patient and/or surrogate as well as nursing, discussions with consultants, evaluation of patient's response to treatment, examination of patient, obtaining history from patient or surrogate, ordering and performing treatments and interventions, ordering and review of laboratory studies, ordering and review of radiographic studies, pulse oximetry and re-evaluation of patient's condition.    MDM   Final diagnoses:  End stage renal disease  Essential hypertension, benign  CAP (community acquired pneumonia)  Hyperkalemia  Metabolic acidosis  Non-STEMI (non-ST elevated myocardial infarction)    Patient with you abnormalities found during the workup. Probably most stemming to renal failure untreated. Patient apparently was undergoing  dialysis in March patient refused. Patient brought in by daughter tonight now patient is confused. Metabolic acidosis probably due to uremia hyperkalemia EKG changes some widening of the QRS complexes and peak T waves based on old EKG. Patient to be treated with bicarbonate and the calcium gluconate to stabilize things. Patient also with anemia but that has been present in the past. X-ray shows pleural effusion questionable of pneumonia. Patient did not have a fall today but had a fall a week ago does have a contusion around the left eye with an eyebrow laceration. 2 Will do suture at this point in time. Patient is followed by [facility] care. We'll be a hospitalist admission. Patient also is elevated troponin possible non-STEMI but could also be related to uremia. Chest x-ray raises some concern as mentioned for pleural effusion and also maybe for pneumonia subject to many acquired pneumonia patient has not hospitalized recently. Patient is to care of at home by her daughter. Daughter brought her in because patient was acting confused today.  CT head without any acute intercranial changes. There is some mild blood probably in the left maxillary area but no evidence of any fracture.       Fredia Sorrow, MD  07/02/13 0042 

## 2013-07-01 NOTE — ED Notes (Signed)
Phlebotomy at the bedside for type and cross.

## 2013-07-01 NOTE — ED Notes (Signed)
Reported BP to Dr. Anner Crete, MD is placing orders for management.

## 2013-07-01 NOTE — ED Notes (Signed)
Spoke with daughter, Ms. Newman. She reports that the patient has anemia, and usually has Hgb level around 7. She also reports the patient was supposed to be started on dialysis back in march, but refused placement for dialysis access.  She reports the patient can typically get around the house with a walker, but the last few days, she has not been able to stand up to use the walker from weakness.

## 2013-07-01 NOTE — ED Notes (Signed)
Per EMS, patient is from home. Tripped at home, no loss of consciousness, she did not hit her head today.  Patient has been feeling well lately, and family reported to EMS that the patient has been noncompliant with medications the past few days.  Hx: HTN VS: 178/100, P 80, RR 22.  Normal mentation.

## 2013-07-01 NOTE — ED Notes (Signed)
Called troponin level of 0.65 to Dr. Anner Crete. MD acknowledges, no new orders received.

## 2013-07-01 NOTE — ED Notes (Signed)
Called pharmacy for calcium gluconate. 

## 2013-07-01 NOTE — ED Notes (Signed)
Patient returned to room from testing. Placed on telemonitor

## 2013-07-01 NOTE — ED Notes (Signed)
Phlebotomy at the bedside  

## 2013-07-01 NOTE — ED Notes (Signed)
0.65 called in from lab.

## 2013-07-01 NOTE — ED Notes (Signed)
Patient reports she felll, but does not hurt anywhere at this time.  Bandaid over left eye is from a fall 1 week ago.  Bruising noted on left cheek.

## 2013-07-02 ENCOUNTER — Inpatient Hospital Stay (HOSPITAL_COMMUNITY): Payer: Medicare HMO

## 2013-07-02 ENCOUNTER — Encounter (HOSPITAL_COMMUNITY): Payer: Self-pay | Admitting: Radiology

## 2013-07-02 DIAGNOSIS — J189 Pneumonia, unspecified organism: Secondary | ICD-10-CM

## 2013-07-02 DIAGNOSIS — E039 Hypothyroidism, unspecified: Secondary | ICD-10-CM

## 2013-07-02 DIAGNOSIS — R7989 Other specified abnormal findings of blood chemistry: Secondary | ICD-10-CM

## 2013-07-02 DIAGNOSIS — E875 Hyperkalemia: Secondary | ICD-10-CM

## 2013-07-02 DIAGNOSIS — I10 Essential (primary) hypertension: Secondary | ICD-10-CM

## 2013-07-02 DIAGNOSIS — E785 Hyperlipidemia, unspecified: Secondary | ICD-10-CM

## 2013-07-02 DIAGNOSIS — N189 Chronic kidney disease, unspecified: Secondary | ICD-10-CM

## 2013-07-02 DIAGNOSIS — N19 Unspecified kidney failure: Secondary | ICD-10-CM | POA: Diagnosis present

## 2013-07-02 DIAGNOSIS — D631 Anemia in chronic kidney disease: Secondary | ICD-10-CM | POA: Diagnosis present

## 2013-07-02 DIAGNOSIS — I5031 Acute diastolic (congestive) heart failure: Secondary | ICD-10-CM | POA: Diagnosis present

## 2013-07-02 DIAGNOSIS — I509 Heart failure, unspecified: Secondary | ICD-10-CM

## 2013-07-02 DIAGNOSIS — I160 Hypertensive urgency: Secondary | ICD-10-CM | POA: Diagnosis present

## 2013-07-02 DIAGNOSIS — I214 Non-ST elevation (NSTEMI) myocardial infarction: Secondary | ICD-10-CM

## 2013-07-02 DIAGNOSIS — G9341 Metabolic encephalopathy: Secondary | ICD-10-CM

## 2013-07-02 DIAGNOSIS — E872 Acidosis, unspecified: Secondary | ICD-10-CM

## 2013-07-02 DIAGNOSIS — R778 Other specified abnormalities of plasma proteins: Secondary | ICD-10-CM | POA: Diagnosis present

## 2013-07-02 DIAGNOSIS — I369 Nonrheumatic tricuspid valve disorder, unspecified: Secondary | ICD-10-CM

## 2013-07-02 DIAGNOSIS — N186 End stage renal disease: Secondary | ICD-10-CM

## 2013-07-02 DIAGNOSIS — N184 Chronic kidney disease, stage 4 (severe): Secondary | ICD-10-CM | POA: Diagnosis present

## 2013-07-02 LAB — RETICULOCYTES
RBC.: 2.7 MIL/uL — AB (ref 3.87–5.11)
RETIC COUNT ABSOLUTE: 45.9 10*3/uL (ref 19.0–186.0)
Retic Ct Pct: 1.7 % (ref 0.4–3.1)

## 2013-07-02 LAB — MRSA PCR SCREENING: MRSA by PCR: POSITIVE — AB

## 2013-07-02 LAB — CBC
HEMATOCRIT: 22.7 % — AB (ref 36.0–46.0)
HEMOGLOBIN: 7.1 g/dL — AB (ref 12.0–15.0)
MCH: 25.7 pg — AB (ref 26.0–34.0)
MCHC: 31.3 g/dL (ref 30.0–36.0)
MCV: 82.2 fL (ref 78.0–100.0)
Platelets: 184 10*3/uL (ref 150–400)
RBC: 2.76 MIL/uL — ABNORMAL LOW (ref 3.87–5.11)
RDW: 13 % (ref 11.5–15.5)
WBC: 8.8 10*3/uL (ref 4.0–10.5)

## 2013-07-02 LAB — RENAL FUNCTION PANEL
Albumin: 2.5 g/dL — ABNORMAL LOW (ref 3.5–5.2)
BUN: 83 mg/dL — AB (ref 6–23)
CO2: 19 mEq/L (ref 19–32)
Calcium: 9.1 mg/dL (ref 8.4–10.5)
Chloride: 105 mEq/L (ref 96–112)
Creatinine, Ser: 8.12 mg/dL — ABNORMAL HIGH (ref 0.50–1.10)
GFR calc Af Amer: 5 mL/min — ABNORMAL LOW (ref >90)
GFR calc non Af Amer: 4 mL/min — ABNORMAL LOW (ref >90)
Glucose, Bld: 140 mg/dL — ABNORMAL HIGH (ref 70–99)
PHOSPHORUS: 5.5 mg/dL — AB (ref 2.3–4.6)
Potassium: 4.2 mEq/L (ref 3.7–5.3)
Sodium: 144 mEq/L (ref 137–147)

## 2013-07-02 LAB — PROTIME-INR
INR: 1.05 (ref 0.00–1.49)
PROTHROMBIN TIME: 13.5 s (ref 11.6–15.2)

## 2013-07-02 LAB — TROPONIN I
Troponin I: 0.4 ng/mL (ref <0.30)
Troponin I: 0.36 ng/mL (ref <0.30)

## 2013-07-02 LAB — HEPATITIS B SURFACE ANTIBODY,QUALITATIVE: Hep B S Ab: NEGATIVE

## 2013-07-02 LAB — HEPATITIS B SURFACE ANTIGEN: Hepatitis B Surface Ag: NEGATIVE

## 2013-07-02 LAB — HEPATITIS B CORE ANTIBODY, TOTAL: Hep B Core Total Ab: NONREACTIVE

## 2013-07-02 MED ORDER — CHLORHEXIDINE GLUCONATE CLOTH 2 % EX PADS
6.0000 | MEDICATED_PAD | Freq: Every day | CUTANEOUS | Status: AC
  Administered 2013-07-02 – 2013-07-06 (×5): 6 via TOPICAL

## 2013-07-02 MED ORDER — DEXTROSE 5 % IV SOLN: 500.0000 mg | INTRAVENOUS | Status: DC

## 2013-07-02 MED ORDER — ADULT MULTIVITAMIN W/MINERALS CH
1.0000 | ORAL_TABLET | Freq: Every day | ORAL | Status: DC
  Administered 2013-07-02 – 2013-07-08 (×6): 1 via ORAL
  Filled 2013-07-02 (×7): qty 1

## 2013-07-02 MED ORDER — CALCITRIOL 0.25 MCG PO CAPS: 0.2500 ug | ORAL_CAPSULE | ORAL | Status: DC

## 2013-07-02 MED ORDER — HYDRALAZINE HCL 20 MG/ML IJ SOLN
10.0000 mg | Freq: Four times a day (QID) | INTRAMUSCULAR | Status: DC | PRN
  Administered 2013-07-02: 13:00:00 via INTRAVENOUS
  Administered 2013-07-02 – 2013-07-05 (×3): 10 mg via INTRAVENOUS
  Filled 2013-07-02 (×5): qty 1

## 2013-07-02 MED ORDER — DEXTROSE 5 % IV SOLN: 1.0000 g | INTRAVENOUS | Status: DC

## 2013-07-02 MED ORDER — DARBEPOETIN ALFA-POLYSORBATE 100 MCG/0.5ML IJ SOLN
100.0000 ug | INTRAMUSCULAR | Status: DC
  Administered 2013-07-02: 100 ug via INTRAVENOUS
  Filled 2013-07-02: qty 0.5

## 2013-07-02 MED ORDER — HYALURONIDASE HUMAN 150 UNIT/ML IJ SOLN
15.0000 [IU] | Freq: Once | INTRAMUSCULAR | Status: AC
  Administered 2013-07-02: 1 [IU] via SUBCUTANEOUS
  Filled 2013-07-02: qty 0.1

## 2013-07-02 MED ORDER — FENTANYL CITRATE 0.05 MG/ML IJ SOLN
INTRAMUSCULAR | Status: AC | PRN
  Administered 2013-07-02: 50 ug via INTRAVENOUS

## 2013-07-02 MED ORDER — CEFAZOLIN SODIUM-DEXTROSE 2-3 GM-% IV SOLR: 2.0000 g | Freq: Once | INTRAVENOUS | Status: DC

## 2013-07-02 MED ORDER — ASPIRIN 325 MG PO TABS
325.0000 mg | ORAL_TABLET | Freq: Every day | ORAL | Status: DC
  Administered 2013-07-02 – 2013-07-08 (×7): 325 mg via ORAL
  Filled 2013-07-02 (×8): qty 1

## 2013-07-02 MED ORDER — LABETALOL HCL 300 MG PO TABS
300.0000 mg | ORAL_TABLET | Freq: Three times a day (TID) | ORAL | Status: DC
  Administered 2013-07-02 (×3): 300 mg via ORAL
  Filled 2013-07-02 (×6): qty 1

## 2013-07-02 MED ORDER — MIDAZOLAM HCL 2 MG/2ML IJ SOLN
INTRAMUSCULAR | Status: AC
  Filled 2013-07-02: qty 2

## 2013-07-02 MED ORDER — SODIUM CHLORIDE 0.9 % IV SOLN
20.0000 ug | Freq: Once | INTRAVENOUS | Status: AC
  Administered 2013-07-02: 20 ug via INTRAVENOUS
  Filled 2013-07-02: qty 5

## 2013-07-02 MED ORDER — HEPARIN (PORCINE) IN NACL 100-0.45 UNIT/ML-% IJ SOLN
750.0000 [IU]/h | INTRAMUSCULAR | Status: DC
  Administered 2013-07-02: 750 [IU]/h via INTRAVENOUS
  Filled 2013-07-02: qty 250

## 2013-07-02 MED ORDER — GELATIN ABSORBABLE 12-7 MM EX MISC
CUTANEOUS | Status: AC
  Filled 2013-07-02: qty 1

## 2013-07-02 MED ORDER — DOXAZOSIN MESYLATE 4 MG PO TABS
4.0000 mg | ORAL_TABLET | Freq: Two times a day (BID) | ORAL | Status: DC
  Administered 2013-07-02 – 2013-07-08 (×14): 4 mg via ORAL
  Filled 2013-07-02 (×19): qty 1

## 2013-07-02 MED ORDER — FERROUS SULFATE 325 (65 FE) MG PO TABS
650.0000 mg | ORAL_TABLET | Freq: Every day | ORAL | Status: DC
  Filled 2013-07-02 (×2): qty 2

## 2013-07-02 MED ORDER — NITROGLYCERIN IN D5W 200-5 MCG/ML-% IV SOLN: 2.0000 ug/min | INTRAVENOUS | Status: DC

## 2013-07-02 MED ORDER — FENTANYL CITRATE 0.05 MG/ML IJ SOLN
INTRAMUSCULAR | Status: AC
  Filled 2013-07-02: qty 2

## 2013-07-02 MED ORDER — ATORVASTATIN CALCIUM 40 MG PO TABS
40.0000 mg | ORAL_TABLET | Freq: Every day | ORAL | Status: DC
  Administered 2013-07-02 – 2013-07-06 (×4): 40 mg via ORAL
  Filled 2013-07-02 (×8): qty 1

## 2013-07-02 MED ORDER — DOXERCALCIFEROL 4 MCG/2ML IV SOLN
1.0000 ug | INTRAVENOUS | Status: DC
  Administered 2013-07-02 – 2013-07-07 (×3): 1 ug via INTRAVENOUS
  Filled 2013-07-02 (×3): qty 2

## 2013-07-02 MED ORDER — SODIUM POLYSTYRENE SULFONATE 15 GM/60ML PO SUSP: 45.0000 g | Freq: Once | ORAL | Status: DC

## 2013-07-02 MED ORDER — SODIUM POLYSTYRENE SULFONATE 15 GM/60ML PO SUSP
45.0000 g | Freq: Once | ORAL | Status: AC
  Administered 2013-07-02: 45 g via ORAL
  Filled 2013-07-02: qty 180

## 2013-07-02 MED ORDER — FUROSEMIDE 10 MG/ML IJ SOLN
100.0000 mg | Freq: Once | INTRAVENOUS | Status: AC
  Administered 2013-07-02: 100 mg via INTRAVENOUS
  Filled 2013-07-02: qty 10

## 2013-07-02 MED ORDER — MUPIROCIN 2 % EX OINT
1.0000 "application " | TOPICAL_OINTMENT | Freq: Two times a day (BID) | CUTANEOUS | Status: AC
  Administered 2013-07-02 – 2013-07-06 (×10): 1 via NASAL
  Filled 2013-07-02: qty 22

## 2013-07-02 MED ORDER — MIDAZOLAM HCL 2 MG/2ML IJ SOLN
INTRAMUSCULAR | Status: AC | PRN
  Administered 2013-07-02: 2 mg via INTRAVENOUS

## 2013-07-02 MED ORDER — VANCOMYCIN HCL IN DEXTROSE 1-5 GM/200ML-% IV SOLN
1000.0000 mg | Freq: Once | INTRAVENOUS | Status: AC
  Administered 2013-07-02: 1000 mg via INTRAVENOUS
  Filled 2013-07-02: qty 200

## 2013-07-02 MED ORDER — ASPIRIN 325 MG PO TABS
325.0000 mg | ORAL_TABLET | Freq: Every day | ORAL | Status: DC
  Filled 2013-07-02: qty 1

## 2013-07-02 MED ORDER — LABETALOL HCL 200 MG PO TABS
200.0000 mg | ORAL_TABLET | ORAL | Status: AC
  Administered 2013-07-02: 200 mg via ORAL
  Filled 2013-07-02: qty 1

## 2013-07-02 MED ORDER — CEFAZOLIN SODIUM-DEXTROSE 2-3 GM-% IV SOLR
INTRAVENOUS | Status: AC
  Filled 2013-07-02: qty 50

## 2013-07-02 MED ORDER — LABETALOL HCL 200 MG PO TABS: 200.0000 mg | ORAL_TABLET | Freq: Three times a day (TID) | ORAL | Status: DC

## 2013-07-02 MED ORDER — CLONIDINE HCL 0.1 MG PO TABS
0.1000 mg | ORAL_TABLET | Freq: Two times a day (BID) | ORAL | Status: DC
  Administered 2013-07-02 (×2): 0.1 mg via ORAL
  Filled 2013-07-02 (×4): qty 1

## 2013-07-02 MED ORDER — HEPARIN SODIUM (PORCINE) 1000 UNIT/ML IJ SOLN
INTRAMUSCULAR | Status: AC
  Filled 2013-07-02: qty 1

## 2013-07-02 MED ORDER — HEPARIN BOLUS VIA INFUSION
3100.0000 [IU] | Freq: Once | INTRAVENOUS | Status: AC
  Filled 2013-07-02: qty 3100

## 2013-07-02 NOTE — Consult Note (Addendum)
CARDIOLOGY CONSULT NOTE   Patient ID: Jaclyn Morgan MRN: [id number], DOB/AGE: Oct 23, 1932   Admit date: 07/01/2013 Date of Consult: 07/02/2013   Primary Physician: Jaclyn Calico, MD Primary Cardiologist: None  Pt. Profile  [phone] with very poorly controlled hypertension complicated by CKD V who presents with altered mental status, metabolic disarray, and elevated BNP and TnI in setting of deferring dialysis initiation.   Problem List  Past Medical History  Diagnosis Date  . Anemia   . Hypertension   . Hyperlipidemia   . Chronic kidney disease   . Hypothyroidism   . End-stage renal disease (ESRD)   . Urinary incontinence, functional   . Anxiety     Past Surgical History  Procedure Laterality Date  . Cataract extraction  06/07/2010  . Transthoracic echocardiogram  10/2009    EF=>55%, mild conc LVH; LA mildly dilated; mild mitral annular calcif, mild MR; mild TR, RVSP 40-1mmHg; AV mildly sclerotic; mild pulm valve regurg; aortic root sclerosis/calcif   . Nm myocar perf wall motion  10/2009    dipyridamole; mild-mod ischemia in apical anterior, basal inferolateral, mid inferoalteral region; post-stress EF 66%; high risk      Allergies  Allergies  Allergen Reactions  . Norvasc [Amlodipine Besylate] Nausea Only  . Zocor [Simvastatin] Other (See Comments)    Kidney Issues?    HPI   80F with very poorly controlled hypertension complicated by CKD V who presents with altered mental status, metabolic disarray, and elevated BNP and TnI in setting of deferring dialysis initiation.   Jaclyn Morgan is altered and as such a very poor historian. Limited history obtained from the chart and the nurses and physicians taking care of the patient at this time. Jaclyn Morgan has longstanding CKD and was appropriate for dialysis in March. For unknown reasons, she wished to defer initiation of dialysis. She is amenable to dialysis now.   She comes in to the ED with progressive altered mental status  and a fall a week ago. She is very hypertensive to the 230s despite initiation of IV nitro drip. She has a laceration above her left eyebrow and evidence of poor self care.  Head CT demonstrated no intracranial bleed. Labs demonstrated hyperkalemia, uremia, and acidemia as inferred by a low bicarbonate. BNP was > 70,000. TnI in setting of Cr of 8 was 0.65. No CK MB has been checked.   On my interview, Jaclyn Morgan's only complaint is that she has felt down since the death of her husband, which she thinks was two weeks ago. (I cannot confirm this at this time). She denies chest pain, chest pressure, palpitations, skipped beats, syncope, near syncope, dyspnea. When asked about her fall, she states she just fell and cannot give details.   Inpatient Medications  . aspirin  325 mg Oral Daily  . aspirin  325 mg Oral Daily  . atorvastatin  40 mg Oral q1800  . [START ON 07/04/2013] calcitRIOL  0.25 mcg Oral Q M,W,F  . doxazosin  4 mg Oral BID  . ferrous sulfate  650 mg Oral Q breakfast  . labetalol  200 mg Oral TID  . labetalol  200 mg Oral STAT  . multivitamin with minerals  1 tablet Oral Daily    Family History Family History  Problem Relation Age of Onset  . Hyperlipidemia Other   . Hypertension Other   . Kidney disease Other   . Diabetes Other   . Hypertension Mother   . Diabetes Daughter   .  Hypertension Daughter   . Cancer Son   . Cancer Sister      Social History History   Social History  . Marital Status: Widowed    Spouse Name: N/A    Number of Children: 3  . Years of Education: B.A.    Occupational History  . Not on file.   Social History Main Topics  . Smoking status: Never Smoker   . Smokeless tobacco: Never Used  . Alcohol Use: No  . Drug Use: No  . Sexual Activity: Not Currently   Other Topics Concern  . Not on file   Social History Narrative  . No narrative on file     Review of Systems Unable to perform a reliable ROS. However, she denies any complaints  aside from sadness over the loss of her husband  Physical Exam  Blood pressure 232/130, pulse 85, temperature 97.9 F (36.6 C), temperature source Rectal, resp. rate 18, height 5' 1.02" (1.55 m), weight 72 kg (158 lb 11.7 oz), SpO2 100.00%.  General: Pleasant, NAD. Confused.  Psych: Normal affect. Neuro: Alert. Not oriented. Moves all extremities spontaneously. HEENT: Laceration over left eyebrow. She has crusting around her eyelids  Neck: Supple without bruits . + JVD Lungs:  Clear Heart: RRR no s3, s4. Faint rub Abdomen: Soft, non-tender, non-distended, obese Extremities: No clubbing, cyanosis. 3+ LE edema DP/PT/Radials 2+ and equal bilaterally.  Labs   Recent Labs  07/01/13 2055  TROPONINI 0.65*   Lab Results  Component Value Date   WBC 11.7* 07/01/2013   HGB 7.8* 07/01/2013   HCT 24.9* 07/01/2013   MCV 83.3 07/01/2013   PLT 222 07/01/2013    Recent Labs Lab 07/01/13 2055  NA 142  K 5.9*  CL 103  CO2 16*  BUN 81*  CREATININE 7.96*  CALCIUM 9.5  PROT 6.5  BILITOT 0.8  ALKPHOS 61  ALT 9  AST 29  GLUCOSE 104*   Lab Results  Component Value Date   CHOL 256* 07/23/2012   HDL 47.00 07/23/2012   TRIG 159.0* 07/23/2012   No results found for this basename: DDIMER    Radiology/Studies  Dg Chest 2 View  07/01/2013   CLINICAL DATA:  Fall  EXAM: CHEST  2 VIEW  COMPARISON:  Radiograph 12/28/2012  FINDINGS: Cardiac silhouette is mildly enlarged. There is a new right pleural effusion. Concern for right lower lobe airspace disease additionally.  IMPRESSION: 1. New right pleural effusion. 2. Right lobe atelectasis versus infiltrate.   Electronically Signed   By: Suzy Bouchard M.D.   On: 07/01/2013 22:40   Ct Head Wo Contrast  07/01/2013   CLINICAL DATA:  80 year old female with altered mental status following fall. Hypertension.  EXAM: CT HEAD WITHOUT CONTRAST  TECHNIQUE: Contiguous axial images were obtained from the base of the skull through the vertex without  intravenous contrast.  COMPARISON:  None.  FINDINGS: Moderate chronic small-vessel white matter ischemic changes are noted.  No acute intracranial abnormalities are identified, including mass lesion or mass effect, hydrocephalus, extra-axial fluid collection, midline shift, hemorrhage, or acute infarction.  The visualized bony calvarium is unremarkable.  Mild left forehead soft tissue swelling is.  IMPRESSION: No evidence of acute intracranial abnormality.  Mild left forehead soft tissue swelling without fracture.  Moderate chronic small-vessel white matter ischemic changes.   Electronically Signed   By: Hassan Rowan M.D.   On: 07/01/2013 21:50   Ct Maxillofacial Wo Cm  07/01/2013   CLINICAL DATA:  Fall, left black  eye.  EXAM: CT MAXILLOFACIAL WITHOUT CONTRAST  TECHNIQUE: Multidetector CT imaging of the maxillofacial structures was performed. Multiplanar CT image reconstructions were also generated. A small metallic BB was placed on the right temple in order to reliably differentiate right from left.  COMPARISON:  None.  FINDINGS: Left periorbital/supra or soft tissue swelling with contusion is present. Hyperdensity within the left anterior chamber itself may represent blood. The globes are intact without evidence of acute injury. No retro-orbital hematoma or other pathology. Intraconal and extraconal fat are well well preserved.  The bony orbits are intact without evidence of orbital floor fracture. Zygomatic arches are intact.  Maxilla is intact. No nasal bone fracture. Mandible is intact. Mandibular condyles are normally positioned within the temporomandibular fossa.  Paranasal sinuses are well pneumatized and free of fluid. No mastoid effusion.  A advanced atrophy noted within the partially visualized right  IMPRESSION: 1. Left periorbital/supraorbital contusion. 2. Intact globes. Hyperdensity within the left anterior chamber suggestive of hemorrhage. Correlation with physical recommended. No retro-orbital  hematoma or other pathology. 3. No acute maxillofacial fracture.   Electronically Signed   By: Jeannine Boga M.D.   On: 07/01/2013 22:33    ECG NSR. LAFB  ASSESSMENT AND PLAN  85F with very poorly controlled hypertension complicated by CKD V who presents with altered mental status, metabolic disarray, and elevated BNP and TnI in setting of deferring dialysis initiation. She is now apparently agreeable to HD.   Ms. Dogan has many indications for initiation of dialysis: hypervolemia, acidemia, hyperkalemia, and a rub on exam. The extent of the volume overload is emphasized by its apparent effect on the heart, as demonstrated by a BNP that is higher than assay. In this setting, she also has a mildly elevated TnI which is more likely a Type II MI than an acute MI. Her ECG is non-ischemic and she has no chest pain. Although we often err on the side of treating a Type II MI as an ACS event early on, I worry that that approach in the setting of extraordinarily high blood pressures and recent fall with head trauma could lead to hemorrhagic complications.   Also with very positive UA.   Recommendations 1. Cycle cardiac biomarkers (TnI and CK-MB) 2. Obtain echocardiogram 3. Would hold off on heparinization at this time given unpredictable BPs, recent fall, and high probability of Type II mechanism; can reconsider this once BP better controlled 4. ASA 81mg  daily, atorvastatin 80mg  qHS 5. BP control (goal SBP 180 for now) with IV nitro and beta blockers (may require labetolol gtt) although she will benefit most from volume removal 6. Renal consult for urgent dialysis  Signed, Lamar Sprinkles, MD  07/02/2013, 2:18 AM

## 2013-07-02 NOTE — ED Notes (Signed)
Reported to Dr. Arnoldo Morale that patient is currently off the nitro drip with BP 127/78. Reviewed all medications received for BP management, and central line placement. MD acknowledges, and allows for transfer.

## 2013-07-02 NOTE — Progress Notes (Signed)
Pt had first hemodialysis Tx today. Ran for 3hrs with a BFR of 200 and DFR of 500. Had frequent drops in BP (80's) and had to come out of UF often. Pulled 1205cc out of a 2000cc goal.

## 2013-07-02 NOTE — ED Notes (Signed)
IV team at the bedside. 

## 2013-07-02 NOTE — H&P (Signed)
Referring Physician: Dr. Lorrene Reid HPI: Jaclyn Morgan is an 80 y.o. female with acute on chronic renal failure and presented with progressive weakness, confusion and increased falls.The patient has previously refused dialysis. IR received request for image guided tunneled HD catheter placement. The patient and her daughter are agreeable to proceed at this point.  Past Medical History:  Past Medical History  Diagnosis Date  . Anemia   . Hypertension   . Hyperlipidemia   . Chronic kidney disease   . Hypothyroidism   . End-stage renal disease (ESRD)   . Urinary incontinence, functional   . Anxiety     Past Surgical History:  Past Surgical History  Procedure Laterality Date  . Cataract extraction  06/07/2010  . Transthoracic echocardiogram  10/2009    EF=>55%, mild conc LVH; LA mildly dilated; mild mitral annular calcif, mild MR; mild TR, RVSP 40-21mHg; AV mildly sclerotic; mild pulm valve regurg; aortic root sclerosis/calcif   . Nm myocar perf wall motion  10/2009    dipyridamole; mild-mod ischemia in apical anterior, basal inferolateral, mid inferoalteral region; post-stress EF 66%; high risk     Family History:  Family History  Problem Relation Age of Onset  . Hyperlipidemia Other   . Hypertension Other   . Kidney disease Other   . Diabetes Other   . Hypertension Mother   . Diabetes Daughter   . Hypertension Daughter   . Cancer Son   . Cancer Sister     Social History:  reports that she has never smoked. She has never used smokeless tobacco. She reports that she does not drink alcohol or use illicit drugs.  Allergies:  Allergies  Allergen Reactions  . Norvasc [Amlodipine Besylate] Nausea Only  . Zocor [Simvastatin] Other (See Comments)    Kidney Issues?    Medications:   Medication List    ASK your doctor about these medications       acetaminophen 500 MG tablet  Commonly known as:  TYLENOL  Take 1,000 mg by mouth 2 (two) times daily as needed (pain).      BAYER ASPIRIN 325 MG tablet  Generic drug:  aspirin  Take 325 mg by mouth daily.     calcitRIOL 0.25 MCG capsule  Commonly known as:  ROCALTROL  Take 0.25 mcg by mouth every Monday, Wednesday, and Friday.     cloNIDine 0.1 MG tablet  Commonly known as:  CATAPRES  Take 0.1 mg by mouth daily.     doxazosin 2 MG tablet  Commonly known as:  CARDURA  Take 4 mg by mouth 2 (two) times daily.     ferrous sulfate 325 (65 FE) MG tablet  Take 650 mg by mouth daily with breakfast.     furosemide 40 MG tablet  Commonly known as:  LASIX  Take 1 tablet (40 mg total) by mouth daily. No further refills without an appointment.     labetalol 200 MG tablet  Commonly known as:  NORMODYNE  Take 200 mg by mouth 3 (three) times daily.     multivitamin with minerals Tabs tablet  Take 1 tablet by mouth daily.     rosuvastatin 20 MG tablet  Commonly known as:  CRESTOR  Take 20 mg by mouth every evening.       Please HPI for pertinent positives, otherwise complete 10 system ROS negative.  Physical Exam: BP 180/99  Pulse 72  Temp(Src) 98 F (36.7 C) (Axillary)  Resp 25  Ht 5' 3"  (1.6 m)  Wt  174 lb 13.2 oz (79.3 kg)  BMI 30.98 kg/m2  SpO2 100% Body mass index is 30.98 kg/(m^2).  General Appearance:  Appears tired, cooperative, no distress  Head:  Normocephalic, without obvious abnormality, atraumatic  Neck: Supple, symmetrical, trachea midline  Lungs:   Clear to auscultation bilaterally, no w/r/r, respirations unlabored without use of accessory muscles.  Chest Wall:  No tenderness or deformity  Heart:  Regular rate and rhythm, S1, S2 normal, no murmur, rub or gallop.  Abdomen:   Soft, non-tender, non distended.  Extremities: Extremities 2+ edema bilaterally.   Neurologic: Alert, responds, not oriented.    Results for orders placed during the hospital encounter of 07/01/13 (from the past 48 hour(s))  CBG MONITORING, ED     Status: None   Collection Time    07/01/13  8:48 PM       Result Value Ref Range   Glucose-Capillary 97  70 - 99 mg/dL  CBC     Status: Abnormal   Collection Time    07/01/13  8:55 PM      Result Value Ref Range   WBC 11.7 (*) 4.0 - 10.5 K/uL   RBC 2.99 (*) 3.87 - 5.11 MIL/uL   Hemoglobin 7.8 (*) 12.0 - 15.0 g/dL   HCT 24.9 (*) 36.0 - 46.0 %   MCV 83.3  78.0 - 100.0 fL   MCH 26.1  26.0 - 34.0 pg   MCHC 31.3  30.0 - 36.0 g/dL   RDW 13.2  11.5 - 15.5 %   Platelets 222  150 - 400 K/uL  BASIC METABOLIC PANEL     Status: Abnormal   Collection Time    07/01/13  8:55 PM      Result Value Ref Range   Sodium 142  137 - 147 mEq/L   Potassium 5.9 (*) 3.7 - 5.3 mEq/L   Chloride 103  96 - 112 mEq/L   CO2 16 (*) 19 - 32 mEq/L   Glucose, Bld 104 (*) 70 - 99 mg/dL   BUN 81 (*) 6 - 23 mg/dL   Creatinine, Ser 7.96 (*) 0.50 - 1.10 mg/dL   Calcium 9.5  8.4 - 10.5 mg/dL   GFR calc non Af Amer 4 (*) >90 mL/min   GFR calc Af Amer 5 (*) >90 mL/min   Comment: (NOTE)     The eGFR has been calculated using the CKD EPI equation.     This calculation has not been validated in all clinical situations.     eGFR's persistently <90 mL/min signify possible Chronic Kidney     Disease.  HEPATIC FUNCTION PANEL     Status: Abnormal   Collection Time    07/01/13  8:55 PM      Result Value Ref Range   Total Protein 6.5  6.0 - 8.3 g/dL   Albumin 2.9 (*) 3.5 - 5.2 g/dL   AST 29  0 - 37 U/L   ALT 9  0 - 35 U/L   Alkaline Phosphatase 61  39 - 117 U/L   Total Bilirubin 0.8  0.3 - 1.2 mg/dL   Bilirubin, Direct 0.2  0.0 - 0.3 mg/dL   Indirect Bilirubin 0.6  0.3 - 0.9 mg/dL  LIPASE, BLOOD     Status: None   Collection Time    07/01/13  8:55 PM      Result Value Ref Range   Lipase 29  11 - 59 U/L  PRO B NATRIURETIC PEPTIDE     Status: Abnormal  Collection Time    07/01/13  8:55 PM      Result Value Ref Range   Pro B Natriuretic peptide (BNP) >70000.0 (*) 0 - 450 pg/mL  TROPONIN I     Status: Abnormal   Collection Time    07/01/13  8:55 PM      Result Value Ref  Range   Troponin I 0.65 (*) <0.30 ng/mL   Comment:            Due to the release kinetics of cTnI,     a negative result within the first hours     of the onset of symptoms does not rule out     myocardial infarction with certainty.     If myocardial infarction is still suspected,     repeat the test at appropriate intervals.     CRITICAL RESULT CALLED TO, READ BACK BY AND VERIFIED WITH:     Henrine Screws [id number] [place]  TYPE AND SCREEN     Status: None   Collection Time    07/01/13  9:42 PM      Result Value Ref Range   ABO/RH(D) A POS     Antibody Screen NEG     Sample Expiration 07/04/2013    ABO/RH     Status: None   Collection Time    07/01/13  9:42 PM      Result Value Ref Range   ABO/RH(D) A POS    URINALYSIS, ROUTINE W REFLEX MICROSCOPIC     Status: Abnormal   Collection Time    07/01/13 11:21 PM      Result Value Ref Range   Color, Urine YELLOW  YELLOW   APPearance CLOUDY (*) CLEAR   Specific Gravity, Urine 1.016  1.005 - 1.030   pH 7.0  5.0 - 8.0   Glucose, UA NEGATIVE  NEGATIVE mg/dL   Hgb urine dipstick MODERATE (*) NEGATIVE   Bilirubin Urine NEGATIVE  NEGATIVE   Ketones, ur 15 (*) NEGATIVE mg/dL   Protein, ur >300 (*) NEGATIVE mg/dL   Urobilinogen, UA 0.2  0.0 - 1.0 mg/dL   Nitrite NEGATIVE  NEGATIVE   Leukocytes, UA MODERATE (*) NEGATIVE  URINE MICROSCOPIC-ADD ON     Status: Abnormal   Collection Time    07/01/13 11:21 PM      Result Value Ref Range   Squamous Epithelial / LPF RARE  RARE   WBC, UA 11-20  <3 WBC/hpf   RBC / HPF 0-2  <3 RBC/hpf   Bacteria, UA MANY (*) RARE  I-STAT CG4 LACTIC ACID, ED     Status: None   Collection Time    07/01/13 11:58 PM      Result Value Ref Range   Lactic Acid, Venous 1.67  0.5 - 2.2 mmol/L  MRSA PCR SCREENING     Status: Abnormal   Collection Time    07/02/13  5:15 AM      Result Value Ref Range   MRSA by PCR POSITIVE (*) NEGATIVE   Comment:            The GeneXpert MRSA Assay (FDA     approved for NASAL  specimens     only), is one component of a     comprehensive MRSA colonization     surveillance program. It is not     intended to diagnose MRSA     infection nor to guide or     monitor treatment for     MRSA  infections.     RESULT CALLED TO, READ BACK BY AND VERIFIED WITH:     MUHORO,D RN 07/02/13 0942 WOOTEN,K  CBC     Status: Abnormal   Collection Time    07/02/13  8:19 AM      Result Value Ref Range   WBC 8.8  4.0 - 10.5 K/uL   RBC 2.76 (*) 3.87 - 5.11 MIL/uL   Hemoglobin 7.1 (*) 12.0 - 15.0 g/dL   HCT 22.7 (*) 36.0 - 46.0 %   MCV 82.2  78.0 - 100.0 fL   MCH 25.7 (*) 26.0 - 34.0 pg   MCHC 31.3  30.0 - 36.0 g/dL   RDW 13.0  11.5 - 15.5 %   Platelets 184  150 - 400 K/uL  TROPONIN I     Status: Abnormal   Collection Time    07/02/13  8:19 AM      Result Value Ref Range   Troponin I 0.40 (*) <0.30 ng/mL   Comment:            Due to the release kinetics of cTnI,     a negative result within the first hours     of the onset of symptoms does not rule out     myocardial infarction with certainty.     If myocardial infarction is still suspected,     repeat the test at appropriate intervals.     CRITICAL VALUE NOTED.  VALUE IS CONSISTENT WITH PREVIOUSLY REPORTED AND CALLED VALUE.  RETICULOCYTES     Status: Abnormal   Collection Time    07/02/13  8:19 AM      Result Value Ref Range   Retic Ct Pct 1.7  0.4 - 3.1 %   RBC. 2.70 (*) 3.87 - 5.11 MIL/uL   Retic Count, Manual 45.9  19.0 - 186.0 K/uL   Dg Chest 2 View  07/01/2013   CLINICAL DATA:  Fall  EXAM: CHEST  2 VIEW  COMPARISON:  Radiograph 12/28/2012  FINDINGS: Cardiac silhouette is mildly enlarged. There is a new right pleural effusion. Concern for right lower lobe airspace disease additionally.  IMPRESSION: 1. New right pleural effusion. 2. Right lobe atelectasis versus infiltrate.   Electronically Signed   By: Suzy Bouchard M.D.   On: 07/01/2013 22:40   Ct Head Wo Contrast  07/01/2013   CLINICAL DATA:  80 year old  female with altered mental status following fall. Hypertension.  EXAM: CT HEAD WITHOUT CONTRAST  TECHNIQUE: Contiguous axial images were obtained from the base of the skull through the vertex without intravenous contrast.  COMPARISON:  None.  FINDINGS: Moderate chronic small-vessel white matter ischemic changes are noted.  No acute intracranial abnormalities are identified, including mass lesion or mass effect, hydrocephalus, extra-axial fluid collection, midline shift, hemorrhage, or acute infarction.  The visualized bony calvarium is unremarkable.  Mild left forehead soft tissue swelling is.  IMPRESSION: No evidence of acute intracranial abnormality.  Mild left forehead soft tissue swelling without fracture.  Moderate chronic small-vessel white matter ischemic changes.   Electronically Signed   By: Hassan Rowan M.D.   On: 07/01/2013 21:50   Dg Chest Portable 1 View  07/02/2013   CLINICAL DATA:  Left central line placement.  EXAM: PORTABLE CHEST - 1 VIEW  COMPARISON:  Chest radiograph from 07/01/2013  FINDINGS: A left IJ line is seen ending about the distal SVC.  The small right-sided pleural effusion has improved. Mild residual right basilar airspace opacity is seen. Mild vascular congestion  is noted. No pneumothorax is identified.  The cardiomediastinal silhouette is borderline enlarged. No acute osseous abnormalities are seen.  IMPRESSION: 1. Left IJ line seen ending about the distal SVC. 2. Small right-sided pleural effusion has improved. Mild residual right basilar airspace opacity may reflect atelectasis or pneumonia. Mild vascular congestion and borderline cardiomegaly.   Electronically Signed   By: Garald Balding M.D.   On: 07/02/2013 05:03   Ct Maxillofacial Wo Cm  07/01/2013   CLINICAL DATA:  Fall, left black eye.  EXAM: CT MAXILLOFACIAL WITHOUT CONTRAST  TECHNIQUE: Multidetector CT imaging of the maxillofacial structures was performed. Multiplanar CT image reconstructions were also generated. A small  metallic BB was placed on the right temple in order to reliably differentiate right from left.  COMPARISON:  None.  FINDINGS: Left periorbital/supra or soft tissue swelling with contusion is present. Hyperdensity within the left anterior chamber itself may represent blood. The globes are intact without evidence of acute injury. No retro-orbital hematoma or other pathology. Intraconal and extraconal fat are well well preserved.  The bony orbits are intact without evidence of orbital floor fracture. Zygomatic arches are intact.  Maxilla is intact. No nasal bone fracture. Mandible is intact. Mandibular condyles are normally positioned within the temporomandibular fossa.  Paranasal sinuses are well pneumatized and free of fluid. No mastoid effusion.  A advanced atrophy noted within the partially visualized right  IMPRESSION: 1. Left periorbital/supraorbital contusion. 2. Intact globes. Hyperdensity within the left anterior chamber suggestive of hemorrhage. Correlation with physical recommended. No retro-orbital hematoma or other pathology. 3. No acute maxillofacial fracture.   Electronically Signed   By: Jeannine Boga M.D.   On: 07/01/2013 22:33    Assessment/Plan Acute on chronic renal failure Metabolic encephalopathy Elevated troponin in setting of ESRD, no clinical symptoms or acute ECG findings, seen by cardiology echo performed. Request for HD catheter placement Patient has been NPO, heparin discontinued and DDAVP given. Risks and Benefits discussed with the patient and her daughter over the phone today. All questions were answered, patient and her daughter are agreeable to proceed. Consent signed and in chart. Patient received Rocephin 1G IV this am, afebrile, labs reviewed.   Tsosie Billing D PA-C 07/02/2013, 10:55 AM

## 2013-07-02 NOTE — ED Notes (Signed)
Spoke with Dr. Arnoldo Morale about IV access, and requested central line.  Will inform ED MD to see if it can be placed.  Also reported calcium infiltrated and if reversal was needed.

## 2013-07-02 NOTE — ED Notes (Signed)
2 missed attempts to start IV.

## 2013-07-02 NOTE — Progress Notes (Signed)
  Echocardiogram 2D Echocardiogram has been performed.  Darlina Sicilian M 07/02/2013, 9:32 AM

## 2013-07-02 NOTE — Progress Notes (Signed)
Pt arrived to room via stretcher from ED; A/O to person/place/situation, disoriented to time; memory deficits, word finding difficulty at times; Bed alarm on, call bell within reach; oriented to room/unit; continue plan of care and monitor closely... Blair Hailey, RN

## 2013-07-02 NOTE — ED Notes (Signed)
Clarified with Dr. Arnoldo Morale, heparin drip. Notified pharmacy.

## 2013-07-02 NOTE — Progress Notes (Signed)
TELEMETRY: Reviewed telemetry pt in NSR: Filed Vitals:   07/02/13 0526 07/02/13 0700 07/02/13 0726 07/02/13 0900  BP: 152/88 189/87 185/91 180/99  Pulse: 69 70 73 72  Temp:   98 F (36.7 C)   TempSrc:   Axillary   Resp: 21 24 24 25   Height:      Weight:      SpO2: 100% 100% 100% 100%    Intake/Output Summary (Last 24 hours) at 07/02/13 0953 Last data filed at 07/02/13 0934  Gross per 24 hour  Intake  136.5 ml  Output      0 ml  Net  136.5 ml   Filed Weights   07/02/13 0018 07/02/13 0019 07/02/13 0358  Weight: 158 lb 11.7 oz (72 kg) 158 lb 11.7 oz (72 kg) 174 lb 13.2 oz (79.3 kg)    Subjective Denies any chest pain or SOB. Not sure how much she understands.  . sodium chloride   Intravenous STAT  . aspirin  325 mg Oral Daily  . atorvastatin  40 mg Oral q1800  . cloNIDine  0.1 mg Oral BID  . darbepoetin (ARANESP) injection - DIALYSIS  100 mcg Intravenous Q Sat-HD  . doxazosin  4 mg Oral BID  . doxercalciferol  1 mcg Intravenous Q T,Th,Sa-HD  . labetalol  300 mg Oral TID  . multivitamin with minerals  1 tablet Oral Daily   . nitroGLYCERIN      LABS: Basic Metabolic Panel:  Recent Labs  07/01/13 2055  NA 142  K 5.9*  CL 103  CO2 16*  GLUCOSE 104*  BUN 81*  CREATININE 7.96*  CALCIUM 9.5   Liver Function Tests:  Recent Labs  07/01/13 2055  AST 29  ALT 9  ALKPHOS 61  BILITOT 0.8  PROT 6.5  ALBUMIN 2.9*    Recent Labs  07/01/13 2055  LIPASE 29   CBC:  Recent Labs  07/01/13 2055 07/02/13 0819  WBC 11.7* 8.8  HGB 7.8* 7.1*  HCT 24.9* 22.7*  MCV 83.3 82.2  PLT 222 184   Cardiac Enzymes:  Recent Labs  07/01/13 2055 07/02/13 0819  TROPONINI 0.65* 0.40*   BNP:  Recent Labs  07/01/13 2055  PROBNP >70000.0*   D-Dimer: No results found for this basename: DDIMER,  in the last 72 hours Hemoglobin A1C: No results found for this basename: HGBA1C,  in the last 72 hours Fasting Lipid Panel: No results found for this basename:  CHOL, HDL, LDLCALC, TRIG, CHOLHDL, LDLDIRECT,  in the last 72 hours Thyroid Function Tests: No results found for this basename: TSH, T4TOTAL, FREET3, T3FREE, THYROIDAB,  in the last 72 hours   Radiology/Studies:  Dg Chest 2 View  07/01/2013   CLINICAL DATA:  Fall  EXAM: CHEST  2 VIEW  COMPARISON:  Radiograph 12/28/2012  FINDINGS: Cardiac silhouette is mildly enlarged. There is a new right pleural effusion. Concern for right lower lobe airspace disease additionally.  IMPRESSION: 1. New right pleural effusion. 2. Right lobe atelectasis versus infiltrate.   Electronically Signed   By: Suzy Bouchard M.D.   On: 07/01/2013 22:40   Ct Head Wo Contrast  07/01/2013   CLINICAL DATA:  80 year old female with altered mental status following fall. Hypertension.  EXAM: CT HEAD WITHOUT CONTRAST  TECHNIQUE: Contiguous axial images were obtained from the base of the skull through the vertex without intravenous contrast.  COMPARISON:  None.  FINDINGS: Moderate chronic small-vessel white matter ischemic changes are noted.  No acute intracranial abnormalities are  identified, including mass lesion or mass effect, hydrocephalus, extra-axial fluid collection, midline shift, hemorrhage, or acute infarction.  The visualized bony calvarium is unremarkable.  Mild left forehead soft tissue swelling is.  IMPRESSION: No evidence of acute intracranial abnormality.  Mild left forehead soft tissue swelling without fracture.  Moderate chronic small-vessel white matter ischemic changes.   Electronically Signed   By: Hassan Rowan M.D.   On: 07/01/2013 21:50   Dg Chest Portable 1 View  07/02/2013   CLINICAL DATA:  Left central line placement.  EXAM: PORTABLE CHEST - 1 VIEW  COMPARISON:  Chest radiograph from 07/01/2013  FINDINGS: A left IJ line is seen ending about the distal SVC.  The small right-sided pleural effusion has improved. Mild residual right basilar airspace opacity is seen. Mild vascular congestion is noted. No pneumothorax is  identified.  The cardiomediastinal silhouette is borderline enlarged. No acute osseous abnormalities are seen.  IMPRESSION: 1. Left IJ line seen ending about the distal SVC. 2. Small right-sided pleural effusion has improved. Mild residual right basilar airspace opacity may reflect atelectasis or pneumonia. Mild vascular congestion and borderline cardiomegaly.   Electronically Signed   By: Garald Balding M.D.   On: 07/02/2013 05:03   Ct Maxillofacial Wo Cm  07/01/2013   CLINICAL DATA:  Fall, left black eye.  EXAM: CT MAXILLOFACIAL WITHOUT CONTRAST  TECHNIQUE: Multidetector CT imaging of the maxillofacial structures was performed. Multiplanar CT image reconstructions were also generated. A small metallic BB was placed on the right temple in order to reliably differentiate right from left.  COMPARISON:  None.  FINDINGS: Left periorbital/supra or soft tissue swelling with contusion is present. Hyperdensity within the left anterior chamber itself may represent blood. The globes are intact without evidence of acute injury. No retro-orbital hematoma or other pathology. Intraconal and extraconal fat are well well preserved.  The bony orbits are intact without evidence of orbital floor fracture. Zygomatic arches are intact.  Maxilla is intact. No nasal bone fracture. Mandible is intact. Mandibular condyles are normally positioned within the temporomandibular fossa.  Paranasal sinuses are well pneumatized and free of fluid. No mastoid effusion.  A advanced atrophy noted within the partially visualized right  IMPRESSION: 1. Left periorbital/supraorbital contusion. 2. Intact globes. Hyperdensity within the left anterior chamber suggestive of hemorrhage. Correlation with physical recommended. No retro-orbital hematoma or other pathology. 3. No acute maxillofacial fracture.   Electronically Signed   By: Jeannine Boga M.D.   On: 07/01/2013 22:33    PHYSICAL EXAM General: Well developed, elderly, in no acute  distress. Head: laceration over left eyebrow.  sclera non-icteric, oropharynx is clear Neck: Negative for carotid bruits. JVD is elevated. No adenopathy Lungs: Clear bilaterally to auscultation without wheezes, rales, or rhonchi. Breathing is unlabored. Heart: RRR S1 S2 without murmurs or gallops. Positive rub Abdomen: Soft, non-tender, non-distended with normoactive bowel sounds. No obvious abdominal masses. Extremities: 2+ edema.  Distal pedal pulses are 2+ and equal bilaterally. Neuro: Alert but not oriented.  ASSESSMENT AND PLAN: 1. Troponin elevation in setting of end stage renal failure. No clinical symptoms of angina. Ecg without acute change. Doubt significance of enzyme elevation in this setting and doubt we will need to pursue ischemic evaluation further. Will await Echo results.  2. Elevated BNP. Clearly volume overloaded due to renal failure. Needs dialysis.   3. Acute on chronic renal failure with uremia, volume overload, pericardial rub, Hyperkalemia, and metabolic acidosis.   4. HTN. Poorly controlled. Non clonidine, cardura, hydralazine, and IV  Ntg. Needs correction of volume overload with dialysis.  Will follow up on Echo today.  Present on Admission:  . Renal failure . Metabolic encephalopathy . Uremia . CKD (chronic kidney disease), stage IV . Hypertensive urgency . CAP (community acquired pneumonia) . NSTEMI (non-ST elevated myocardial infarction) . Acute diastolic CHF (congestive heart failure) . Unspecified hypothyroidism . Anemia of renal disease . Hyperkalemia  Signed, Sriya Kroeze [place], [place] 07/02/2013 9:53 AM

## 2013-07-02 NOTE — Progress Notes (Addendum)
Pt seen and examined, admitted this am per Dr.Jenkins, pls see H&P for details 80/F with CKD4, declined HD access in Dec and again in March. Now with 1. ESRD/volume overload/hyperkalemia, no distress 2. Hypertensive Urgency 3. Anemia of chronic disease 4. Metabolic encephalopathy 5. NSTEMI/Demand ischemia 6. Fall/L orbital contusion -Renal consulted, d/w patient and called and d/w Daughter Doy Mince, they are agreeable to HD access and starting HD -repeat Kayexalate -wean Nitro gtt as tolerated -Do not suspect pneumonia, stop Abx and monitor -continue ASA and on labetalol, cards following -FU ECHO -Check anemia panel  FUll CODE Keep in SDU today  Domenic Polite, MD [phone]

## 2013-07-02 NOTE — ED Notes (Signed)
IV team paged.  

## 2013-07-02 NOTE — ED Provider Notes (Signed)
Asked to place central access by hospitalist do to insufficient peripheral lines.   Procedure note: Triple-lumen catheter placed in internal jugular by ultrasound assistance.  Area cleaned with chlorhexidine. Patient was then draped in a sterile manner in including a full-body drape. 3 mls of 1% lidocaine injected into the site. Using modified Seldinger technique under ultrasound assistance catheter was placed in the left internal jugular vein. Venous flow in all 3 lumens. The catheter was secured, biopatch placed and dressed with Tegaderm.  No immediate complications. Patient tolerated well. Will get chest x-ray to assure proper placement and rule out pneumothorax.  Julianne Rice, MD 07/02/13 [phone]

## 2013-07-02 NOTE — Consult Note (Signed)
PHARMACY CONSULT NOTE - Initial Consult  Pharmacy Consult for : Heparin Indication: ACS  Allergies Allergies  Allergen Reactions  . Norvasc [Amlodipine Besylate] Nausea Only  . Zocor [Simvastatin] Other (See Comments)    Kidney Issues?    Patient Measurements: Heparin Dosing weight 63.5 kg  Vital Signs: BP 222/176  Pulse 86  Temp(Src) 97.9 F (36.6 C) (Rectal)  Resp 24  Ht 5' 1.02" (1.55 m)  Wt 158 lb 11.7 oz (72 kg)  BMI 29.97 kg/m2  SpO2 100%  Labs:  Recent Labs  07/01/13 2055  HGB 7.8*  HCT 24.9*  PLT 222  CREATININE 7.96*   Estimated Creatinine Clearance: 5.3 ml/min (by C-G formula based on Cr of 7.96).  Medical History: Past Medical History  Diagnosis Date  . Anemia   . Hypertension   . Hyperlipidemia   . Chronic kidney disease   . Hypothyroidism   . End-stage renal disease (ESRD)   . Urinary incontinence, functional   . Anxiety    Past Surgical History  Procedure Laterality Date  . Cataract extraction  06/07/2010  . Transthoracic echocardiogram  10/2009    EF=>55%, mild conc LVH; LA mildly dilated; mild mitral annular calcif, mild MR; mild TR, RVSP 40-31mmHg; AV mildly sclerotic; mild pulm valve regurg; aortic root sclerosis/calcif   . Nm myocar perf wall motion  10/2009    dipyridamole; mild-mod ischemia in apical anterior, basal inferolateral, mid inferoalteral region; post-stress EF 66%; high risk     Current Medication[s] Include: Medication PTA:  (Not in a hospital admission)  Scheduled:  Scheduled:  . aspirin  325 mg Oral Daily   Infusion[s]: Infusions:  . sodium chloride 50 mL/hr at 07/01/13 2140  . sodium chloride    . azithromycin (ZITHROMAX) 500 MG IVPB    . [START ON 07/03/2013] azithromycin    . calcium gluconate 1 GM IV    . cefTRIAXone (ROCEPHIN)  IV    . [START ON 07/03/2013] cefTRIAXone (ROCEPHIN)  IV     Antibiotic[s]: Anti-infectives   Start     Dose/Rate Route Frequency Ordered Stop   07/03/13 2200  cefTRIAXone  (ROCEPHIN) 1 g in dextrose 5 % 50 mL IVPB     1 g 100 mL/hr over 30 Minutes Intravenous Every 24 hours 07/02/13 0007     07/03/13 2200  azithromycin (ZITHROMAX) 500 mg in dextrose 5 % 250 mL IVPB     500 mg 250 mL/hr over 60 Minutes Intravenous Every 24 hours 07/02/13 0007     07/01/13 2345  cefTRIAXone (ROCEPHIN) 1 g in dextrose 5 % 50 mL IVPB     1 g 100 mL/hr over 30 Minutes Intravenous  Once 07/01/13 2331     07/01/13 2345  azithromycin (ZITHROMAX) 500 mg in dextrose 5 % 250 mL IVPB     500 mg 250 mL/hr over 60 Minutes Intravenous  Once 07/01/13 2331        Assessment:  80 y/o female admitted following a fall.  She has history of ESRD who has refused further dialysis.  Troponin 0.65, Elevated BNP.  CT Head negative for any acute changes.  Heparin, per Pharmacy consult will be started.   Goal of Therapy:  Heparin goal is Heparin level 0.3-0.7 units/ml.   Plan:   Heparin bolus 3100 units IV now  Then begin Heparin infusion at 750 units/hr  Next Heparin level, CBC in 8 hours after start.  Daily heparin level and CBC.  Monitor for bleeding complications.  Follow Platelets.  Corgan Mormile, Craig Guess,  Pharm.D  07/02/2013,  12:21 AM

## 2013-07-02 NOTE — H&P (Addendum)
[facility] History and Physical  Jaclyn Morgan TMH:[id number] DOB: [birth year] DOA: 07/01/2013  Referring physician:  EDP PCP: Scarlette Calico, MD  Specialists:   Chief Complaint: Confusion  HPI: Jaclyn Morgan is a 80 y.o. female with a history of Stage IV CKD, and HTN who was brought to the ED by her daughter due to increased confusion worsening over the past 2-3 days.   Patient tripped in her home 1 week ago and suffered a contusion to the Left orbit, and per the daughter she seemed to continue to get worse over the past week.   She was advised in March of 2015 that she needed Dialysis treatments, but she refused at that time.   In the ED, she was found to have a BUN/Cr = 81 / 7.96 with hyperkalemia of 5.9.   She also had a Pro BNP = 70 K, and a Troponin = 0.65.    She was referred for medical admission, and a cardiology consultation was placed.      Review of Systems:  Unable to Obtain from the Patient   Past Medical History  Diagnosis Date  . Anemia   . Hypertension   . Hyperlipidemia   . Chronic kidney disease   . Hypothyroidism   . End-stage renal disease (ESRD)   . Urinary incontinence, functional   . Anxiety     Past Surgical History  Procedure Laterality Date  . Cataract extraction  06/07/2010  . Transthoracic echocardiogram  10/2009    EF=>55%, mild conc LVH; LA mildly dilated; mild mitral annular calcif, mild MR; mild TR, RVSP 40-64mmHg; AV mildly sclerotic; mild pulm valve regurg; aortic root sclerosis/calcif   . Nm myocar perf wall motion  10/2009    dipyridamole; mild-mod ischemia in apical anterior, basal inferolateral, mid inferoalteral region; post-stress EF 66%; high risk      Prior to Admission medications   Medication Sig Start Date End Date Taking? Authorizing Provider  acetaminophen (TYLENOL) 500 MG tablet Take 1,000 mg by mouth 2 (two) times daily as needed (pain).   Yes Historical Provider, MD  aspirin (BAYER ASPIRIN) 325 MG tablet Take 325 mg by  mouth daily.     Yes Historical Provider, MD  calcitRIOL (ROCALTROL) 0.25 MCG capsule Take 0.25 mcg by mouth every Monday, Wednesday, and Friday.    Yes Historical Provider, MD  cloNIDine (CATAPRES) 0.1 MG tablet Take 0.1 mg by mouth daily.    Yes Historical Provider, MD  doxazosin (CARDURA) 2 MG tablet Take 4 mg by mouth 2 (two) times daily.    Yes Historical Provider, MD  ferrous sulfate 325 (65 FE) MG tablet Take 650 mg by mouth daily with breakfast.   Yes Historical Provider, MD  furosemide (LASIX) 40 MG tablet Take 1 tablet (40 mg total) by mouth daily. No further refills without an appointment.   Yes Pixie Casino, MD  labetalol (NORMODYNE) 200 MG tablet Take 200 mg by mouth 3 (three) times daily.    Yes Historical Provider, MD  Multiple Vitamin (MULTIVITAMIN WITH MINERALS) TABS tablet Take 1 tablet by mouth daily.   Yes Historical Provider, MD  rosuvastatin (CRESTOR) 20 MG tablet Take 20 mg by mouth every evening.   Yes Historical Provider, MD     Allergies  Allergen Reactions  . Norvasc [Amlodipine Besylate] Nausea Only  . Zocor [Simvastatin] Other (See Comments)    Kidney Issues?    Social History:  reports that she has never smoked. She has never used smokeless  tobacco. She reports that she does not drink alcohol or use illicit drugs.     Family History  Problem Relation Age of Onset  . Hyperlipidemia Other   . Hypertension Other   . Kidney disease Other   . Diabetes Other   . Hypertension Mother   . Diabetes Daughter   . Hypertension Daughter   . Cancer Son   . Cancer Sister      Physical Exam:  GEN: Confused Obese 80 y.o. African American female examined and in no acute distress; cooperative with exam Filed Vitals:   07/02/13 0201 07/02/13 0224 07/02/13 0225 07/02/13 0232  BP: 232/130 217/115 217/115 196/100  Pulse:    80  Temp:      TempSrc:      Resp:    20  Height:      Weight:      SpO2:    100%   Blood pressure 196/100, pulse 80, temperature 97.9  F (36.6 C), temperature source Rectal, resp. rate 20, height 5' 1.02" (1.55 m), weight 72 kg (158 lb 11.7 oz), SpO2 100.00%. PSYCH: She is alert and oriented x2; does not appear anxious does not appear depressed; affect is normal HEENT: Normocephalic and Atraumatic, Mucous membranes pink; PERRLA; EOM intact; Fundi:  Benign;  No scleral icterus, Nares: Patent, Oropharynx: Clear, Fair Dentition, Neck:  FROM, no cervical lymphadenopathy nor thyromegaly or carotid bruit; no JVD; Breasts:: Not examined CHEST WALL: No tenderness CHEST: Normal respiration, clear to auscultation bilaterally HEART: Regular rate and rhythm; no murmurs rubs or gallops BACK: No kyphosis or scoliosis; no CVA tenderness ABDOMEN: Positive Bowel Sounds, Obese, soft non-tender; no masses, no organomegaly, no pannus; no intertriginous candida. Rectal Exam: Not done EXTREMITIES: No cyanosis, clubbing or 2+ EDEMA to BLEs, No ulcerations. Genitalia: not examined PULSES: 2+ and symmetric SKIN: + peri-Orbital Ecchymosis of the Left Eye, and laceration above Left superior palpebral Fissure.   CNS:  Alert and Oriented X 2,  No focal Deficits.    Vascular: pulses palpable throughout    Labs on Admission:  Basic Metabolic Panel:  Recent Labs Lab 07/01/13 2055  NA 142  K 5.9*  CL 103  CO2 16*  GLUCOSE 104*  BUN 81*  CREATININE 7.96*  CALCIUM 9.5   Liver Function Tests:  Recent Labs Lab 07/01/13 2055  AST 29  ALT 9  ALKPHOS 61  BILITOT 0.8  PROT 6.5  ALBUMIN 2.9*    Recent Labs Lab 07/01/13 2055  LIPASE 29   No results found for this basename: AMMONIA,  in the last 168 hours CBC:  Recent Labs Lab 07/01/13 2055  WBC 11.7*  HGB 7.8*  HCT 24.9*  MCV 83.3  PLT 222   Cardiac Enzymes:  Recent Labs Lab 07/01/13 2055  TROPONINI 0.65*    BNP (last 3 results)  Recent Labs  07/01/13 2055  PROBNP >70000.0*   CBG:  Recent Labs Lab 07/01/13 2048  GLUCAP 97    Radiological Exams on  Admission: Dg Chest 2 View  07/01/2013   CLINICAL DATA:  Fall  EXAM: CHEST  2 VIEW  COMPARISON:  Radiograph 12/28/2012  FINDINGS: Cardiac silhouette is mildly enlarged. There is a new right pleural effusion. Concern for right lower lobe airspace disease additionally.  IMPRESSION: 1. New right pleural effusion. 2. Right lobe atelectasis versus infiltrate.   Electronically Signed   By: Suzy Bouchard M.D.   On: 07/01/2013 22:40   Ct Head Wo Contrast  07/01/2013   CLINICAL DATA:  80 year old female with altered mental status following fall. Hypertension.  EXAM: CT HEAD WITHOUT CONTRAST  TECHNIQUE: Contiguous axial images were obtained from the base of the skull through the vertex without intravenous contrast.  COMPARISON:  None.  FINDINGS: Moderate chronic small-vessel white matter ischemic changes are noted.  No acute intracranial abnormalities are identified, including mass lesion or mass effect, hydrocephalus, extra-axial fluid collection, midline shift, hemorrhage, or acute infarction.  The visualized bony calvarium is unremarkable.  Mild left forehead soft tissue swelling is.  IMPRESSION: No evidence of acute intracranial abnormality.  Mild left forehead soft tissue swelling without fracture.  Moderate chronic small-vessel white matter ischemic changes.   Electronically Signed   By: Hassan Rowan M.D.   On: 07/01/2013 21:50   Ct Maxillofacial Wo Cm  07/01/2013   CLINICAL DATA:  Fall, left black eye.  EXAM: CT MAXILLOFACIAL WITHOUT CONTRAST  TECHNIQUE: Multidetector CT imaging of the maxillofacial structures was performed. Multiplanar CT image reconstructions were also generated. A small metallic BB was placed on the right temple in order to reliably differentiate right from left.  COMPARISON:  None.  FINDINGS: Left periorbital/supra or soft tissue swelling with contusion is present. Hyperdensity within the left anterior chamber itself may represent blood. The globes are intact without evidence of acute  injury. No retro-orbital hematoma or other pathology. Intraconal and extraconal fat are well well preserved.  The bony orbits are intact without evidence of orbital floor fracture. Zygomatic arches are intact.  Maxilla is intact. No nasal bone fracture. Mandible is intact. Mandibular condyles are normally positioned within the temporomandibular fossa.  Paranasal sinuses are well pneumatized and free of fluid. No mastoid effusion.  A advanced atrophy noted within the partially visualized right  IMPRESSION: 1. Left periorbital/supraorbital contusion. 2. Intact globes. Hyperdensity within the left anterior chamber suggestive of hemorrhage. Correlation with physical recommended. No retro-orbital hematoma or other pathology. 3. No acute maxillofacial fracture.   Electronically Signed   By: Jeannine Boga M.D.   On: 07/01/2013 22:33     EKG: Independently reviewed. Normal Sinus Rhythm ,  Q waves in Anterior Leads:    Assessment/Plan:   80 y.o. female with  Principal Problem:   Uremia Active Problems:   Metabolic encephalopathy   CKD (chronic kidney disease), stage IV   Hyperkalemia   Hypertensive urgency   Acute diastolic CHF (congestive heart failure)   Unspecified hypothyroidism   Renal failure   CAP (community acquired pneumonia)   NSTEMI (non-ST elevated myocardial infarction)   Anemia of renal disease   Left Orbital Contusion   1.  Uremia/CKD Stage IV-  BUN/Cr=  81/7.96,  Now Amenable to starting Dialysis treatments. Discussed with Renal Dr. Lorrene Reid to see this AM.       2.  Metabolic Encephalopathy -due to #1.    CT Head Negative Acute .     3.  Hyperkalemia-   K+ = 5.9,  Given Calcium Gluconate IV x 1, and Sodium Bicarb IV x1, and Kayexalate 45 gram PO x 1,  Re check K+ in 6 hours.   Cardura, and added PRN IV Hydralazine, and if Dialysis Rx, then adjust BPs meds.     4.  NSTEMI-   IV NTG,  O2, ASA Rx,  Discontinued IV Heparin due to #4.    5.  CAP-  Blood Cultures Sent,  Placed  on IV Rocephin and Azithromycin.   CXR   6.   Acute CHF-  Probably Diastolic,  Fluid Overload,   Pro  BNP = 70K,    IV Lasix 100 mg x1,   BiPAP   PRN,   2D ECHO in AM without contrast.    7.   Hypothyroidism-   Check TSH Level.    8.   Anemia of Renal Disease-   Check Anemia panel.      9.   Left Orbital contusion-  From fall 1 week ago,  CT scan of maxillofacial Negative for Fractures + Contusion.     10. DVT prophylaxis with SCDs.         Code Status:  FULL CODE     Family Communication:    No Family Present Disposition Plan:     Inpatient  Time spent:  [address] [facility] Pager [phone]  If 7PM-7AM, please contact night-coverage [email or web address] Password [email or web address] 07/02/2013, 3:07 AM

## 2013-07-02 NOTE — ED Notes (Signed)
IV team paged, and is coming to see the patient.

## 2013-07-02 NOTE — ED Notes (Signed)
Dr. Lita Mains at the bedside to place IJ.

## 2013-07-02 NOTE — ED Notes (Signed)
Dr. Arnoldo Morale repaged for IV infiltration and request for central line.

## 2013-07-02 NOTE — ED Notes (Signed)
Dr. Tommi Rumps, Cardio at the bedside.

## 2013-07-02 NOTE — Procedures (Signed)
Interventional Radiology Procedure Note  Procedure: Placement of a 19 cm tip-to-cuff tunneled HD catheter.  Tips in the mid RA and ready for use.  Complications: None immediate Recommendations: - Routine line care  Signed,  Criselda Peaches, MD [facility]

## 2013-07-02 NOTE — Consult Note (Signed)
Requesting Physician:  [facility] - Dr. Arnoldo Morale Reason for Consult:  End stage renal disease HPI: The patient is a 80 y.o. year-old female with known advanced CKD5 d/t hypertensive nephrosclerosis, followed by Dr. Justin Mend, but not compliant with appointments. On review of our notes from the office, she was advised to secure vascular access, saw Dr. Daiva Eves 1 in December for AVF, then cancelled her surgery, cancelled her next appt with Dr. Justin Mend and has not been seen in the office since. She was prescribed EPO at Short Stay for anemia, but has not been there either since December.  She had been advised that she needed to start dialysis several months back but declined.  According to her daughter Doy Mince [phone] she "just wasn't ready"  She was brought to the ED by her family, who reported progressive weakness, decreased ambulation, recent falls, poor compliance with medications, confusion (since a fall onto a dresser drawer last week),  several months of leg edema, poor appetite.   She was found to have a creatinine on 7.96, BUN 81, CO2 of 16, elevated troponin of 0.65, Hb 7.8, CXR with mild vascular congestion. CT brain (done b/o recent fall/confusion) no acute change and facial CT just showed left periorbital contusion.  She received an amp of calcium gluconate and one of  bicarb in the ED and kayexelate was ordered but I cannot tell that it was given. She got a dose of ibuprofen for ?  Creatinine trending is as follows: Creatinine, Ser  Date/Time Value Ref Range Status  07/01/2013  8:55 PM 7.96* 0.50 - 1.10 mg/dL Final  04/12/2013  2:00 PM 6.64* 0.50 - 1.10 mg/dL Final  12/28/2012  1:49 PM 6.75* 0.50 - 1.10 mg/dL Final  11/23/2012  2:34 PM 6.76* 0.50 - 1.10 mg/dL Final  10/11/2012  1:30 PM 6.27* 0.50 - 1.10 mg/dL Final  09/16/2012 11:45 AM 6.91* 0.50 - 1.10 mg/dL Final  07/23/2012  1:58 PM 5.2* 0.4 - 1.2 mg/dL Final  06/24/2012 12:19 PM 5.13* 0.50 - 1.10 mg/dL Final  04/28/2012  2:00 PM  4.68* 0.50 - 1.10 mg/dL Final  04/01/2012  1:54 PM 4.53* 0.50 - 1.10 mg/dL Final  03/04/2012  1:51 PM 4.48* 0.50 - 1.10 mg/dL Final  12/18/2011  2:30 PM 4.48* 0.50 - 1.10 mg/dL Final  11/13/2011  1:55 PM 4.50* 0.50 - 1.10 mg/dL Final  10/07/2011  1:46 PM 4.19* 0.50 - 1.10 mg/dL Final  09/09/2011  1:09 PM 4.18* 0.50 - 1.10 mg/dL Final  08/04/2011  2:30 PM 3.50* 0.50 - 1.10 mg/dL Final  06/30/2011  1:56 PM 3.54* 0.50 - 1.10 mg/dL Final  05/26/2011  1:24 PM 4.38* 0.50 - 1.10 mg/dL Final  04/30/2011  2:08 PM 3.40* 0.50 - 1.10 mg/dL Final  04/28/2011 10:57 AM 3.54* 0.50 - 1.10 mg/dL Final  03/24/2011 11:50 AM 3.27* 0.50 - 1.10 mg/dL Final   Past Medical History  Diagnosis Date  . Anemia   . Hypertension   . Hyperlipidemia   . Chronic kidney disease   . Hypothyroidism   . End-stage renal disease (ESRD)   . Urinary incontinence, functional   . Anxiety    Past Surgical History  Procedure Laterality Date  . Cataract extraction  06/07/2010  . Transthoracic echocardiogram  10/2009    EF=>55%, mild conc LVH; LA mildly dilated; mild mitral annular calcif, mild MR; mild TR, RVSP 40-51mmHg; AV mildly sclerotic; mild pulm valve regurg; aortic root sclerosis/calcif   . Nm myocar perf wall motion  10/2009  dipyridamole; mild-mod ischemia in apical anterior, basal inferolateral, mid inferoalteral region; post-stress EF 66%; high risk    Family History  Problem Relation Age of Onset  . Hyperlipidemia Other   . Hypertension Other   . Kidney disease Other   . Diabetes Other   . Hypertension Mother   . Diabetes Daughter   . Hypertension Daughter   . Cancer Son   . Cancer Sister    Social History:  reports that she has never smoked. She has never used smokeless tobacco. She reports that she does not drink alcohol or use illicit drugs.  Allergies:  Allergies  Allergen Reactions  . Norvasc [Amlodipine Besylate] Nausea Only  . Zocor [Simvastatin] Other (See Comments)    Kidney Issues?    Home  medications: Prior to Admission medications   Medication Sig Start Date End Date Taking? Authorizing Provider  acetaminophen (TYLENOL) 500 MG tablet Take 1,000 mg by mouth 2 (two) times daily as needed (pain).   Yes Historical Provider, MD  aspirin (BAYER ASPIRIN) 325 MG tablet Take 325 mg by mouth daily.     Yes Historical Provider, MD  calcitRIOL (ROCALTROL) 0.25 MCG capsule Take 0.25 mcg by mouth every Monday, Wednesday, and Friday.    Yes Historical Provider, MD  cloNIDine (CATAPRES) 0.1 MG tablet Take 0.1 mg by mouth daily.    Yes Historical Provider, MD  doxazosin (CARDURA) 2 MG tablet Take 4 mg by mouth 2 (two) times daily.    Yes Historical Provider, MD  ferrous sulfate 325 (65 FE) MG tablet Take 650 mg by mouth daily with breakfast.   Yes Historical Provider, MD  furosemide (LASIX) 40 MG tablet Take 1 tablet (40 mg total) by mouth daily. No further refills without an appointment.   Yes Pixie Casino, MD  labetalol (NORMODYNE) 200 MG tablet Take 200 mg by mouth 3 (three) times daily.    Yes Historical Provider, MD  Multiple Vitamin (MULTIVITAMIN WITH MINERALS) TABS tablet Take 1 tablet by mouth daily.   Yes Historical Provider, MD  rosuvastatin (CRESTOR) 20 MG tablet Take 20 mg by mouth every evening.   Yes Historical Provider, MD    Inpatient medications: . sodium chloride   Intravenous STAT  . aspirin  325 mg Oral Daily  . aspirin  325 mg Oral Daily  . atorvastatin  40 mg Oral q1800  . [START ON 07/03/2013] azithromycin  500 mg Intravenous Q24H  . [START ON 07/04/2013] calcitRIOL  0.25 mcg Oral Q M,W,F  . [START ON 07/03/2013] cefTRIAXone (ROCEPHIN)  IV  1 g Intravenous Q24H  . doxazosin  4 mg Oral BID  . ferrous sulfate  650 mg Oral Q breakfast  . labetalol  300 mg Oral TID  . multivitamin with minerals  1 tablet Oral Daily  . sodium polystyrene  45 g Oral Once    Review of Systems Limited from patient Per daughter Carline:  progressive loss of appetite, poor po intake,  leg swelling, frequent falls, bladder incontinence, diminished ambulation, confusion (since fall and bumped her head), refusal to take meds despite urging from son and daughter, nausea, some muscle jerking  Physical Exam:  Blood pressure 152/88, pulse 69, temperature 98 F (36.7 C), temperature source Oral, resp. rate 21, height 5\' 3"  (1.6 m), weight 79.3 kg (174 lb 13.2 oz), SpO2 100.00%.  Gen: Elderly AAF. Lying in bed. NAD. Incontinent of urine. Not agitated or belligerent. Line in left IJ Skin: no rash, cyanosis Neck: no JVD, no bruits or  LAN Chest: fairly clear without crackles or wheezes3 Heart: S1S2 No S3 No pericardial friction rub Abdomen: soft, + BS; no focal tenderness Ext: 2+ edema of both LE's Neuro: Alert, oriented to person but not to place ("I'm in this lady's house) or time; knows she is here to start dialysis and recalls previous discussions with Dr. Justin Mend about dialysis and says is "ready"; + asterixus Heme/Lymph: bruising over left eye with periorbital hematoma and swelling  Recent Labs Lab 07/01/13 2055  NA 142  K 5.9*  CL 103  CO2 16*  GLUCOSE 104*  BUN 81*  CREATININE 7.96*  CALCIUM 9.5    Recent Labs Lab 07/01/13 2055  AST 29  ALT 9  ALKPHOS 61  BILITOT 0.8  PROT 6.5  ALBUMIN 2.9*    Recent Labs Lab 07/01/13 2055  LIPASE 29    Recent Labs Lab 07/01/13 2055  WBC 11.7*  HGB 7.8*  HCT 24.9*  MCV 83.3  PLT 222   Recent Labs Lab 07/01/13 2055  TROPONINI 0.65*   CBG:  Recent Labs Lab 07/01/13 2048  GLUCAP 97   Results for SAHIAN, KERNEY (MRN [id number]) as of 07/02/2013 08:07  Ref. Range 07/01/2013 23:21  Color, Urine Latest Range: YELLOW  YELLOW  APPearance Latest Range: CLEAR  CLOUDY (A)  Specific Gravity, Urine Latest Range: 1.005-1.030  1.016  pH Latest Range: 5.0-8.0  7.0  Glucose Latest Range: NEGATIVE mg/dL NEGATIVE  Bilirubin Urine Latest Range: NEGATIVE  NEGATIVE  Ketones, ur Latest Range: NEGATIVE mg/dL 15 (A)   Protein Latest Range: NEGATIVE mg/dL >300 (A)  Urobilinogen, UA Latest Range: 0.0-1.0 mg/dL 0.2  Nitrite Latest Range: NEGATIVE  NEGATIVE  Leukocytes, UA Latest Range: NEGATIVE  MODERATE (A)  Hgb urine dipstick Latest Range: NEGATIVE  MODERATE (A)  WBC, UA Latest Range: <3 WBC/hpf 11-20  RBC / HPF Latest Range: <3 RBC/hpf 0-2  Squamous Epithelial / LPF Latest Range: RARE  RARE  Bacteria, UA Latest Range: RARE  MANY (A)   Xrays/Other Studies: Dg Chest 2 View  07/01/2013   CLINICAL DATA:  Fall  EXAM: CHEST  2 VIEW  COMPARISON:  Radiograph 12/28/2012  FINDINGS: Cardiac silhouette is mildly enlarged. There is a new right pleural effusion. Concern for right lower lobe airspace disease additionally.  IMPRESSION: 1. New right pleural effusion. 2. Right lobe atelectasis versus infiltrate.   Electronically Signed   By: Suzy Bouchard M.D.   On: 07/01/2013 22:40   Ct Head Wo Contrast  07/01/2013   CLINICAL DATA:  80 year old female with altered mental status following fall. Hypertension.  EXAM: CT HEAD WITHOUT CONTRAST  TECHNIQUE: Contiguous axial images were obtained from the base of the skull through the vertex without intravenous contrast.  COMPARISON:  None.  FINDINGS: Moderate chronic small-vessel white matter ischemic changes are noted.  No acute intracranial abnormalities are identified, including mass lesion or mass effect, hydrocephalus, extra-axial fluid collection, midline shift, hemorrhage, or acute infarction.  The visualized bony calvarium is unremarkable.  Mild left forehead soft tissue swelling is.  IMPRESSION: No evidence of acute intracranial abnormality.  Mild left forehead soft tissue swelling without fracture.  Moderate chronic small-vessel white matter ischemic changes.   Electronically Signed   By: Hassan Rowan M.D.   On: 07/01/2013 21:50   Dg Chest Portable 1 View  07/02/2013   CLINICAL DATA:  Left central line placement.  EXAM: PORTABLE CHEST - 1 VIEW  COMPARISON:  Chest radiograph  from 07/01/2013  FINDINGS: A left IJ line  is seen ending about the distal SVC.  The small right-sided pleural effusion has improved. Mild residual right basilar airspace opacity is seen. Mild vascular congestion is noted. No pneumothorax is identified.  The cardiomediastinal silhouette is borderline enlarged. No acute osseous abnormalities are seen.  IMPRESSION: 1. Left IJ line seen ending about the distal SVC. 2. Small right-sided pleural effusion has improved. Mild residual right basilar airspace opacity may reflect atelectasis or pneumonia. Mild vascular congestion and borderline cardiomegaly.   Electronically Signed   By: Garald Balding M.D.   On: 07/02/2013 05:03   Ct Maxillofacial Wo Cm  07/01/2013   CLINICAL DATA:  Fall, left black eye.  EXAM: CT MAXILLOFACIAL WITHOUT CONTRAST  TECHNIQUE: Multidetector CT imaging of the maxillofacial structures was performed. Multiplanar CT image reconstructions were also generated. A small metallic BB was placed on the right temple in order to reliably differentiate right from left.  COMPARISON:  None.  FINDINGS: Left periorbital/supra or soft tissue swelling with contusion is present. Hyperdensity within the left anterior chamber itself may represent blood. The globes are intact without evidence of acute injury. No retro-orbital hematoma or other pathology. Intraconal and extraconal fat are well well preserved.  The bony orbits are intact without evidence of orbital floor fracture. Zygomatic arches are intact.  Maxilla is intact. No nasal bone fracture. Mandible is intact. Mandibular condyles are normally positioned within the temporomandibular fossa.  Paranasal sinuses are well pneumatized and free of fluid. No mastoid effusion.  A advanced atrophy noted within the partially visualized right  IMPRESSION: 1. Left periorbital/supraorbital contusion. 2. Intact globes. Hyperdensity within the left anterior chamber suggestive of hemorrhage. Correlation with physical  recommended. No retro-orbital hematoma or other pathology. 3. No acute maxillofacial fracture.   Electronically Signed   By: Jeannine Boga M.D.   On: 07/01/2013 22:33   Impression/Plan 1. ESRD - refused for months. Pt now  agrees as does family to catheter placement and initiation of HD. IR to place today. Stop anticoagulants.  DDAVP X 1 dose. 1st HD today after catheter placement.  Start CLIP process on Monday. Consult [facility] on Monday for AVF (saw Dr. Oneida Alar Dec 2014 but cancelled her surgery and did not go back) 2. Anemia - check iron studies. Initiate darbepoetin with HD 100 QSat 3. Secondary hyperpara - on po calcitriol at home with no recent PTH; change to hectoro with HD; check PTH for adjustment. Check phosphorus. 4. Hypertension with hypertensive urgency - d/t failure to take BP meds at home. Reinitiate meds, volume control with HD will help 5. Confusion/ams - at least in part uremic.  CT after recent fall no stroke or bleed. If does not clear with HD will need further evaluation. Urine looks dirty. Check culture. Got a dose of Rocephin in the ED.  6. NSTEMI - presume demand ischemia. Cards is following.  They stopped heparin.  Advised BB and ASA. Cycling enzymes. 7. HLD - statin   Jamal Maes,  MD [facility] [phone] pager 07/02/2013, 7:17 AM

## 2013-07-03 DIAGNOSIS — I10 Essential (primary) hypertension: Secondary | ICD-10-CM

## 2013-07-03 LAB — CBC
HCT: 17.5 % — ABNORMAL LOW (ref 36.0–46.0)
Hemoglobin: 5.6 g/dL — CL (ref 12.0–15.0)
MCH: 26.3 pg (ref 26.0–34.0)
MCHC: 32 g/dL (ref 30.0–36.0)
MCV: 82.2 fL (ref 78.0–100.0)
Platelets: 140 10*3/uL — ABNORMAL LOW (ref 150–400)
RBC: 2.13 MIL/uL — ABNORMAL LOW (ref 3.87–5.11)
RDW: 13.2 % (ref 11.5–15.5)
WBC: 7.1 10*3/uL (ref 4.0–10.5)

## 2013-07-03 LAB — RENAL FUNCTION PANEL
ALBUMIN: 2.4 g/dL — AB (ref 3.5–5.2)
BUN: 44 mg/dL — ABNORMAL HIGH (ref 6–23)
CO2: 22 meq/L (ref 19–32)
Calcium: 8.7 mg/dL (ref 8.4–10.5)
Chloride: 104 mEq/L (ref 96–112)
Creatinine, Ser: 5.18 mg/dL — ABNORMAL HIGH (ref 0.50–1.10)
GFR calc Af Amer: 8 mL/min — ABNORMAL LOW (ref >90)
GFR calc non Af Amer: 7 mL/min — ABNORMAL LOW (ref >90)
Glucose, Bld: 95 mg/dL (ref 70–99)
POTASSIUM: 3.5 meq/L — AB (ref 3.7–5.3)
Phosphorus: 3.9 mg/dL (ref 2.3–4.6)
Sodium: 144 mEq/L (ref 137–147)

## 2013-07-03 LAB — PREPARE RBC (CROSSMATCH)

## 2013-07-03 MED ORDER — CLONIDINE HCL 0.1 MG PO TABS
0.1000 mg | ORAL_TABLET | Freq: Two times a day (BID) | ORAL | Status: DC
  Administered 2013-07-03 – 2013-07-07 (×9): 0.1 mg via ORAL
  Filled 2013-07-03 (×10): qty 1

## 2013-07-03 MED ORDER — LABETALOL HCL 200 MG PO TABS
200.0000 mg | ORAL_TABLET | Freq: Two times a day (BID) | ORAL | Status: DC
  Administered 2013-07-03 – 2013-07-08 (×9): 200 mg via ORAL
  Filled 2013-07-03 (×11): qty 1

## 2013-07-03 MED ORDER — LABETALOL HCL 100 MG PO TABS
100.0000 mg | ORAL_TABLET | Freq: Two times a day (BID) | ORAL | Status: DC
  Administered 2013-07-03: 100 mg via ORAL
  Filled 2013-07-03 (×2): qty 1

## 2013-07-03 MED ORDER — ENOXAPARIN SODIUM 30 MG/0.3ML ~~LOC~~ SOLN
30.0000 mg | SUBCUTANEOUS | Status: DC
  Administered 2013-07-03 – 2013-07-07 (×5): 30 mg via SUBCUTANEOUS
  Filled 2013-07-03 (×7): qty 0.3

## 2013-07-03 NOTE — Evaluation (Signed)
Physical Therapy Evaluation Patient Details Name: Jaclyn Morgan MRN: [id number] DOB: 11/11/1932 Today's Date: 07/03/2013   History of Present Illness  Patient is a 80 yo female admitted 07/01/13 with uremia, renal failure, metabolic encephalopahy, NSTEMI, HTN.  Patient now on HD.  PMH: CKD, HTN, CHF, mult falls  Clinical Impression  Patient presents with problems listed below.  Will benefit from acute PT to maximize functional mobility prior to discharge.  Recommend SNF at discharge for continued therapy.    Follow Up Recommendations SNF;Supervision/Assistance - 24 hour    Equipment Recommendations  Wheelchair (measurements PT)    Recommendations for Other Services       Precautions / Restrictions Precautions Precautions: Fall Restrictions Weight Bearing Restrictions: No      Mobility  Bed Mobility Overal bed mobility: Needs Assistance Bed Mobility: Supine to Sit     Supine to sit: Min assist     General bed mobility comments: Verbal cues to sit at EOB.  Patient sat up into long sitting and moved LE's to EOB.  Did require mod assist to scoot out to EOB.  Good sitting balance once upright.  Transfers Overall transfer level: Needs assistance Equipment used: Rolling walker (2 wheeled) Transfers: Sit to/from Stand Sit to Stand: Mod assist;+2 physical assistance         General transfer comment: Verbal and tactile cues for hand placement.  Assist to rise to standing.  Patient sat back on EOB "I just needed to sit".  Sit > stand again with +2 assist.  Ambulation/Gait Ambulation/Gait assistance: Mod assist;+2 physical assistance Ambulation Distance (Feet): 15 Feet Assistive device: Rolling walker (2 wheeled) Gait Pattern/deviations: Step-through pattern;Decreased step length - right;Decreased step length - left;Decreased stride length;Shuffle;Trunk flexed Gait velocity: Decreased Gait velocity interpretation: Below normal speed for age/gender General Gait Details:  Required verbal cues and physical assist to maneuver RW.  Repeated cues to stay on task.  Gait unsteady.  Stairs            Wheelchair Mobility    Modified Rankin (Stroke Patients Only)       Balance Overall balance assessment: Needs assistance Sitting-balance support: Single extremity supported;Feet unsupported Sitting balance-Leahy Scale: Fair     Standing balance support: Bilateral upper extremity supported Standing balance-Leahy Scale: Poor                               Pertinent Vitals/Pain     Home Living Family/patient expects to be discharged to:: Skilled nursing facility Living Arrangements: Children (Daughter)             Home Equipment: Gilford Rile - 2 wheels      Prior Function Level of Independence: Needs assistance   Gait / Transfers Assistance Needed: Minimal ambulation with RW with assist.  Daughter reports patient stays in bed most of the time.  ADL's / Homemaking Assistance Needed: Assist for bathing/dressing, meals, and housekeeping.        Hand Dominance        Extremity/Trunk Assessment   Upper Extremity Assessment: Generalized weakness           Lower Extremity Assessment: Generalized weakness         Communication   Communication: No difficulties  Cognition Arousal/Alertness: Awake/alert Behavior During Therapy: WFL for tasks assessed/performed (Smiling; pleasantly confused) Overall Cognitive Status: Impaired/Different from baseline Area of Impairment: Orientation;Attention;Memory;Following commands;Safety/judgement;Awareness;Problem solving Orientation Level: Disoriented to;Time;Situation Current Attention Level: Sustained Memory: Decreased short-term memory Following  Commands: Follows one step commands inconsistently;Follows one step commands with increased time Safety/Judgement: Decreased awareness of deficits;Decreased awareness of safety   Problem Solving: Slow processing;Decreased  initiation;Difficulty sequencing;Requires verbal cues General Comments: Patient smiling during session.  Difficulty following commands.  Easily distracted from task.    General Comments      Exercises        Assessment/Plan    PT Assessment Patient needs continued PT services  PT Diagnosis Difficulty walking;Generalized weakness;Altered mental status   PT Problem List Decreased strength;Decreased activity tolerance;Decreased balance;Decreased mobility;Decreased cognition;Decreased knowledge of use of DME;Decreased safety awareness;Cardiopulmonary status limiting activity  PT Treatment Interventions DME instruction;Gait training;Functional mobility training;Therapeutic exercise;Cognitive remediation;Patient/family education   PT Goals (Current goals can be found in the Care Plan section) Acute Rehab PT Goals Patient Stated Goal: None stated PT Goal Formulation: With patient/family Time For Goal Achievement: 07/17/13 Potential to Achieve Goals: Fair    Frequency Min 3X/week   Barriers to discharge        Co-evaluation               End of Session Equipment Utilized During Treatment: Gait belt Activity Tolerance: Patient limited by fatigue;Patient tolerated treatment well Patient left: in chair;with call bell/phone within reach;with chair alarm set;with family/visitor present Nurse Communication: Mobility status (In chair with chair alarm)         Time: 5465-6812 PT Time Calculation (min): 36 min   Charges:   PT Evaluation $Initial PT Evaluation Tier I: 1 Procedure PT Treatments $Gait Training: 8-22 mins $Therapeutic Activity: 8-22 mins   PT G Codes:          Despina Pole 07/03/2013, 2:41 PM Carita Pian. Sanjuana Kava, [place] Pager [phone]

## 2013-07-03 NOTE — Progress Notes (Signed)
Patient transferred to 3E08.first unit of blood finished without any reaction. 2nd of blood pending.family updated on new location

## 2013-07-03 NOTE — Progress Notes (Signed)
Patient ID: Jaclyn Morgan, female   DOB: November 25, 1932, 80 y.o.   MRN: [id number]   TELEMETRY: Reviewed telemetry pt in NSR:  No PAF/VT  07/03/2013  Filed Vitals:   07/03/13 0400 07/03/13 0800 07/03/13 0819 07/03/13 0900  BP: 101/67 145/61 149/63 158/51  Pulse: 62 62 80 61  Temp: 98.6 F (37 C)  98.1 F (36.7 C)   TempSrc: Oral  Oral   Resp: 18 16 19 17   Height:      Weight:      SpO2: 100% 100% 100% 100%    Intake/Output Summary (Last 24 hours) at 07/03/13 1036 Last data filed at 07/03/13 0949  Gross per 24 hour  Intake  34.33 ml  Output   1205 ml  Net -1170.67 ml   Filed Weights   07/02/13 0358 07/02/13 1825 07/02/13 2132  Weight: 174 lb 13.2 oz (79.3 kg) 171 lb 15.3 oz (78 kg) 168 lb 14 oz (76.6 kg)    Subjective Denies any chest pain or SOB. Complains of being cold all night   . aspirin  325 mg Oral Daily  . atorvastatin  40 mg Oral q1800  . Chlorhexidine Gluconate Cloth  6 each Topical Q0600  . darbepoetin (ARANESP) injection - DIALYSIS  100 mcg Intravenous Q Sat-HD  . doxazosin  4 mg Oral BID  . doxercalciferol  1 mcg Intravenous Q T,Th,Sa-HD  . labetalol  100 mg Oral BID  . multivitamin with minerals  1 tablet Oral Daily  . mupirocin ointment  1 application Nasal BID      LABS: Basic Metabolic Panel:  Recent Labs  07/02/13 2000 07/03/13 0500  NA 144 144  K 4.2 3.5*  CL 105 104  CO2 19 22  GLUCOSE 140* 95  BUN 83* 44*  CREATININE 8.12* 5.18*  CALCIUM 9.1 8.7  PHOS 5.5* 3.9   Liver Function Tests:  Recent Labs  07/01/13 2055 07/02/13 2000 07/03/13 0500  AST 29  --   --   ALT 9  --   --   ALKPHOS 61  --   --   BILITOT 0.8  --   --   PROT 6.5  --   --   ALBUMIN 2.9* 2.5* 2.4*    Recent Labs  07/01/13 2055  LIPASE 29   CBC:  Recent Labs  07/02/13 0819 07/03/13 0500  WBC 8.8 7.1  HGB 7.1* 5.6*  HCT 22.7* 17.5*  MCV 82.2 82.2  PLT 184 140*   Cardiac Enzymes:  Recent Labs  07/01/13 2055 07/02/13 0819 07/02/13 1327    TROPONINI 0.65* 0.40* 0.36*   BNP:  Recent Labs  07/01/13 2055  PROBNP >70000.0*      PHYSICAL EXAM General: Well developed, elderly, in no acute distress. Head: laceration over left eyebrow.  sclera non-icteric, oropharynx is clear Neck: Negative for carotid bruits. JVD is elevated. No adenopathy Lungs: Clear bilaterally to auscultation without wheezes, rales, or rhonchi. Breathing is unlabored. Heart: RRR S1 S2 without murmurs or gallops. Positive rub Abdomen: Soft, non-tender, non-distended with normoactive bowel sounds. No obvious abdominal masses. Extremities: 2+ edema.  Distal pedal pulses are 2+ and equal bilaterally. Neuro: Alert but not oriented.  LIJ catheter and right sided dialysis catheter   ASSESSMENT AND PLAN: 1. Troponin elevation in setting of end stage renal failure. No clinical symptoms of angina. Ecg without acute change. Echo normal EF 55-60% with no RWMA's  No need for further cardiac w/u  2. Elevated BNP. Clearly volume overloaded due  to renal failure. Needs dialysis.   I/Os  Negative 1170  3. Acute on chronic renal failure with uremia, volume overload, pericardial rub, Hyperkalemia, and metabolic acidosis.   4. HTN. Poorly controlled. Home meds clonidine not given,  labatolol at lower dose  ?? Why  I increased labatolol and started clonidine Consider hydralazine as well   Will sign off   Present on Admission:  . Renal failure . Metabolic encephalopathy . Uremia . CKD (chronic kidney disease), stage IV . Hypertensive urgency . CAP (community acquired pneumonia) . (Resolved) NSTEMI (non-ST elevated myocardial infarction) . Acute diastolic CHF (congestive heart failure) . Unspecified hypothyroidism . Anemia of renal disease . Hyperkalemia . Elevated troponin  Rozann Lesches, [place] 07/03/2013 10:35 AM

## 2013-07-03 NOTE — Progress Notes (Signed)
[facility] PROGRESS NOTE  Jaclyn Morgan KDT:[id number] DOB: 08-05-1932 DOA: 07/01/2013 PCP: Scarlette Calico, MD  Assessment/Plan: 1. ESRD/volume overload/hyperkalemia, no distress  -s/p HD catheter placement, HD started 6/20 -BP much improved, cut down meds -needs permanent access -Per Renal  2. Hypertensive Urgency  -much improved post HD -stop clonidine, cut down labetalol  3. Anemia of chronic disease  -drop in Hb, no overt blood loss -anemia panel c/w chronic disease -transfuse 2 units PRBC -needs aranesp  4. Metabolic encephalopathy  -improving, suspect has some baseline cognitive dysfunction too  5. NSTEMI/Demand ischemia  -No clinical symptoms of angina/EKG unremarkable -Echo normal EF 55-60% with no RWMA's  -per cards no need for further cardiac w/u   6. Fall/L orbital contusion   7. Hyperkalemia -resolved with kayexalate, HD  DVT proph: lovenox  Code Status: Full Code Family Communication: none at bedside Disposition Plan: transfer to Tele   Consultants:  Renal  Cardiology  HPI/Subjective: Feels ok, no complaints  Objective: Filed Vitals:   07/03/13 1300  BP: 141/65  Pulse: 60  Temp:   Resp: 14    Intake/Output Summary (Last 24 hours) at 07/03/13 1407 Last data filed at 07/03/13 1248  Gross per 24 hour  Intake  347.5 ml  Output   1205 ml  Net -857.5 ml   Filed Weights   07/02/13 0358 07/02/13 1825 07/02/13 2132  Weight: 79.3 kg (174 lb 13.2 oz) 78 kg (171 lb 15.3 oz) 76.6 kg (168 lb 14 oz)    Exam:   General:  AAOx to self, place  Cardiovascular: S1S2/RRR  Respiratory: CTAB  Abdomen: soft, Nt, BS present  Musculoskeletal: trace edema c/c   Data Reviewed: Basic Metabolic Panel:  Recent Labs Lab 07/01/13 2055 07/02/13 2000 07/03/13 0500  NA 142 144 144  K 5.9* 4.2 3.5*  CL 103 105 104  CO2 16* 19 22  GLUCOSE 104* 140* 95  BUN 81* 83* 44*  CREATININE 7.96* 8.12* 5.18*  CALCIUM 9.5 9.1 8.7  PHOS  --  5.5*  3.9   Liver Function Tests:  Recent Labs Lab 07/01/13 2055 07/02/13 2000 07/03/13 0500  AST 29  --   --   ALT 9  --   --   ALKPHOS 61  --   --   BILITOT 0.8  --   --   PROT 6.5  --   --   ALBUMIN 2.9* 2.5* 2.4*    Recent Labs Lab 07/01/13 2055  LIPASE 29   No results found for this basename: AMMONIA,  in the last 168 hours CBC:  Recent Labs Lab 07/01/13 2055 07/02/13 0819 07/03/13 0500  WBC 11.7* 8.8 7.1  HGB 7.8* 7.1* 5.6*  HCT 24.9* 22.7* 17.5*  MCV 83.3 82.2 82.2  PLT 222 184 140*   Cardiac Enzymes:  Recent Labs Lab 07/01/13 2055 07/02/13 0819 07/02/13 1327  TROPONINI 0.65* 0.40* 0.36*   BNP (last 3 results)  Recent Labs  07/01/13 2055  PROBNP >70000.0*   CBG:  Recent Labs Lab 07/01/13 2048  GLUCAP 97    Recent Results (from the past 240 hour(s))  CULTURE, BLOOD (ROUTINE X 2)     Status: None   Collection Time    07/01/13 11:41 PM      Result Value Ref Range Status   Specimen Description BLOOD RIGHT ANTECUBITAL   Final   Special Requests BOTTLES DRAWN AEROBIC AND ANAEROBIC 5CC   Final   Culture  Setup Time     Final  Value: 07/02/2013 04:07     Performed at [facility]   Culture     Final   Value:        BLOOD CULTURE RECEIVED NO GROWTH TO DATE CULTURE WILL BE HELD FOR 5 DAYS BEFORE ISSUING A FINAL NEGATIVE REPORT     Performed at [facility]   Report Status PENDING   Incomplete  CULTURE, BLOOD (ROUTINE X 2)     Status: None   Collection Time    07/01/13 11:51 PM      Result Value Ref Range Status   Specimen Description BLOOD RIGHT HAND   Final   Special Requests BOTTLES DRAWN AEROBIC ONLY 5CC   Final   Culture  Setup Time     Final   Value: 07/02/2013 04:08     Performed at [facility]   Culture     Final   Value:        BLOOD CULTURE RECEIVED NO GROWTH TO DATE CULTURE WILL BE HELD FOR 5 DAYS BEFORE ISSUING A FINAL NEGATIVE REPORT     Performed at [facility]   Report Status PENDING    Incomplete  MRSA PCR SCREENING     Status: Abnormal   Collection Time    07/02/13  5:15 AM      Result Value Ref Range Status   MRSA by PCR POSITIVE (*) NEGATIVE Final   Comment:            The GeneXpert MRSA Assay (FDA     approved for NASAL specimens     only), is one component of a     comprehensive MRSA colonization     surveillance program. It is not     intended to diagnose MRSA     infection nor to guide or     monitor treatment for     MRSA infections.     RESULT CALLED TO, READ BACK BY AND VERIFIED WITH:     MUHORO,D RN 07/02/13 [place]     Studies: Dg Chest 2 View  07/01/2013   CLINICAL DATA:  Fall  EXAM: CHEST  2 VIEW  COMPARISON:  Radiograph 12/28/2012  FINDINGS: Cardiac silhouette is mildly enlarged. There is a new right pleural effusion. Concern for right lower lobe airspace disease additionally.  IMPRESSION: 1. New right pleural effusion. 2. Right lobe atelectasis versus infiltrate.   Electronically Signed   By: Suzy Bouchard M.D.   On: 07/01/2013 22:40   Ct Head Wo Contrast  07/01/2013   CLINICAL DATA:  80 year old female with altered mental status following fall. Hypertension.  EXAM: CT HEAD WITHOUT CONTRAST  TECHNIQUE: Contiguous axial images were obtained from the base of the skull through the vertex without intravenous contrast.  COMPARISON:  None.  FINDINGS: Moderate chronic small-vessel white matter ischemic changes are noted.  No acute intracranial abnormalities are identified, including mass lesion or mass effect, hydrocephalus, extra-axial fluid collection, midline shift, hemorrhage, or acute infarction.  The visualized bony calvarium is unremarkable.  Mild left forehead soft tissue swelling is.  IMPRESSION: No evidence of acute intracranial abnormality.  Mild left forehead soft tissue swelling without fracture.  Moderate chronic small-vessel white matter ischemic changes.   Electronically Signed   By: Hassan Rowan M.D.   On: 07/01/2013 21:50   Ir Fluoro Guide  Cv Line Right  07/02/2013   INDICATION: 80 year old female with acute on chronic renal failure in need of urgent hemodialysis secondary to uremia. Placement of a  tunneled HD catheter is warranted to facilitate urgent hemodialysis.  EXAM: TUNNELED CENTRAL VENOUS HEMODIALYSIS CATHETER PLACEMENT WITH ULTRASOUND AND FLUOROSCOPIC GUIDANCE  MEDICATIONS: 1 g vancomycin The IV antibiotic was given in an appropriate time interval prior to skin puncture.  CONTRAST:  None  ANESTHESIA/SEDATION: Versed 2 mg IV; Fentanyl 50 mcg IV  Total Moderate Sedation Time  Fifteen minutes.  FLUOROSCOPY TIME:  18 seconds  COMPLICATIONS: None immediate  PROCEDURE: Informed written consent was obtained from the patient's daughter after a discussion of the risks, benefits, and alternatives to treatment. Questions regarding the procedure were encouraged and answered. The right neck and chest were prepped with chlorhexidine in a sterile fashion, and a sterile drape was applied covering the operative field. Maximum barrier sterile technique with sterile gowns and gloves were used for the procedure. A timeout was performed prior to the initiation of the procedure.  After creating a small venotomy incision, a micropuncture kit was utilized to access the right internal jugular vein under direct, real-time ultrasound guidance after the overlying soft tissues were anesthetized with 1% lidocaine with epinephrine. Ultrasound image documentation was performed. The microwire was kinked to measure appropriate catheter length. A stiff glidewire was advanced to the level of the IVC and the micropuncture sheath was exchanged for a peel-away sheath. A Bard hemo split tunneled hemodialysis catheter measuring 19 cm from tip to cuff was tunneled in a retrograde fashion from the anterior chest wall to the venotomy incision.  The catheter was then placed through the peel-away sheath with tips ultimately positioned within the superior aspect of the right atrium.  Final catheter positioning was confirmed and documented with a spot radiographic image. The catheter aspirates and flushes normally. The catheter was flushed with appropriate volume heparin dwells.  The catheter exit site was secured with a 0-Prolene retention suture. The venotomy incision was closed with an interrupted 4-0 Vicryl, Dermabond and Steri-strips. Dressings were applied. The patient tolerated the procedure well without immediate post procedural complication.  IMPRESSION: Successful placement of 19 cm tip to cuff tunneled hemodialysis catheter via the right internal jugular vein with tips terminating within the superior aspect of the right atrium. The catheter is ready for immediate use.  Signed,  Criselda Peaches, MD  [facility]  [facility]   Electronically Signed   By: Jacqulynn Cadet M.D.   On: 07/02/2013 12:02   Ir US Guide Vasc Access Right  07/02/2013   INDICATION: 80 year old female with acute on chronic renal failure in need of urgent hemodialysis secondary to uremia. Placement of a tunneled HD catheter is warranted to facilitate urgent hemodialysis.  EXAM: TUNNELED CENTRAL VENOUS HEMODIALYSIS CATHETER PLACEMENT WITH ULTRASOUND AND FLUOROSCOPIC GUIDANCE  MEDICATIONS: 1 g vancomycin The IV antibiotic was given in an appropriate time interval prior to skin puncture.  CONTRAST:  None  ANESTHESIA/SEDATION: Versed 2 mg IV; Fentanyl 50 mcg IV  Total Moderate Sedation Time  Fifteen minutes.  FLUOROSCOPY TIME:  18 seconds  COMPLICATIONS: None immediate  PROCEDURE: Informed written consent was obtained from the patient's daughter after a discussion of the risks, benefits, and alternatives to treatment. Questions regarding the procedure were encouraged and answered. The right neck and chest were prepped with chlorhexidine in a sterile fashion, and a sterile drape was applied covering the operative field. Maximum barrier sterile technique with sterile  gowns and gloves were used for the procedure. A timeout was performed prior to the initiation of the procedure.  After creating a small venotomy incision,  a micropuncture kit was utilized to access the right internal jugular vein under direct, real-time ultrasound guidance after the overlying soft tissues were anesthetized with 1% lidocaine with epinephrine. Ultrasound image documentation was performed. The microwire was kinked to measure appropriate catheter length. A stiff glidewire was advanced to the level of the IVC and the micropuncture sheath was exchanged for a peel-away sheath. A Bard hemo split tunneled hemodialysis catheter measuring 19 cm from tip to cuff was tunneled in a retrograde fashion from the anterior chest wall to the venotomy incision.  The catheter was then placed through the peel-away sheath with tips ultimately positioned within the superior aspect of the right atrium. Final catheter positioning was confirmed and documented with a spot radiographic image. The catheter aspirates and flushes normally. The catheter was flushed with appropriate volume heparin dwells.  The catheter exit site was secured with a 0-Prolene retention suture. The venotomy incision was closed with an interrupted 4-0 Vicryl, Dermabond and Steri-strips. Dressings were applied. The patient tolerated the procedure well without immediate post procedural complication.  IMPRESSION: Successful placement of 19 cm tip to cuff tunneled hemodialysis catheter via the right internal jugular vein with tips terminating within the superior aspect of the right atrium. The catheter is ready for immediate use.  Signed,  Criselda Peaches, MD  [facility]  [facility]   Electronically Signed   By: Jacqulynn Cadet M.D.   On: 07/02/2013 12:02   Dg Chest Portable 1 View  07/02/2013   CLINICAL DATA:  Left central line placement.  EXAM: PORTABLE CHEST - 1 VIEW  COMPARISON:  Chest radiograph  from 07/01/2013  FINDINGS: A left IJ line is seen ending about the distal SVC.  The small right-sided pleural effusion has improved. Mild residual right basilar airspace opacity is seen. Mild vascular congestion is noted. No pneumothorax is identified.  The cardiomediastinal silhouette is borderline enlarged. No acute osseous abnormalities are seen.  IMPRESSION: 1. Left IJ line seen ending about the distal SVC. 2. Small right-sided pleural effusion has improved. Mild residual right basilar airspace opacity may reflect atelectasis or pneumonia. Mild vascular congestion and borderline cardiomegaly.   Electronically Signed   By: Garald Balding M.D.   On: 07/02/2013 05:03   Ct Maxillofacial Wo Cm  07/01/2013   CLINICAL DATA:  Fall, left black eye.  EXAM: CT MAXILLOFACIAL WITHOUT CONTRAST  TECHNIQUE: Multidetector CT imaging of the maxillofacial structures was performed. Multiplanar CT image reconstructions were also generated. A small metallic BB was placed on the right temple in order to reliably differentiate right from left.  COMPARISON:  None.  FINDINGS: Left periorbital/supra or soft tissue swelling with contusion is present. Hyperdensity within the left anterior chamber itself may represent blood. The globes are intact without evidence of acute injury. No retro-orbital hematoma or other pathology. Intraconal and extraconal fat are well well preserved.  The bony orbits are intact without evidence of orbital floor fracture. Zygomatic arches are intact.  Maxilla is intact. No nasal bone fracture. Mandible is intact. Mandibular condyles are normally positioned within the temporomandibular fossa.  Paranasal sinuses are well pneumatized and free of fluid. No mastoid effusion.  A advanced atrophy noted within the partially visualized right  IMPRESSION: 1. Left periorbital/supraorbital contusion. 2. Intact globes. Hyperdensity within the left anterior chamber suggestive of hemorrhage. Correlation with physical  recommended. No retro-orbital hematoma or other pathology. 3. No acute maxillofacial fracture.   Electronically Signed   By: Pincus Badder.D.  On: 07/01/2013 22:33    Scheduled Meds: . aspirin  325 mg Oral Daily  . atorvastatin  40 mg Oral q1800  . Chlorhexidine Gluconate Cloth  6 each Topical Q0600  . cloNIDine  0.1 mg Oral BID  . darbepoetin (ARANESP) injection - DIALYSIS  100 mcg Intravenous Q Sat-HD  . doxazosin  4 mg Oral BID  . doxercalciferol  1 mcg Intravenous Q T,Th,Sa-HD  . labetalol  200 mg Oral BID  . multivitamin with minerals  1 tablet Oral Daily  . mupirocin ointment  1 application Nasal BID   Continuous Infusions:  Antibiotics Given (last 72 hours)   Date/Time Action Medication Dose Rate   07/02/13 1122 Given   vancomycin (VANCOCIN) IVPB 1000 mg/200 mL premix 1,000 mg 200 mL/hr      Principal Problem:   Uremia Active Problems:   Unspecified hypothyroidism   Renal failure   Metabolic encephalopathy   CKD (chronic kidney disease), stage IV   Hypertensive urgency   CAP (community acquired pneumonia)   Acute diastolic CHF (congestive heart failure)   Anemia of renal disease   Hyperkalemia   Elevated troponin    Time spent: 76mn    Guiselle Mian  [facility] Pager [phone] If 7PM-7AM, please contact night-coverage at [email or web address], password [email or web address]6/21/2015, 2:07 PM  LOS: 2 days

## 2013-07-03 NOTE — Progress Notes (Signed)
BACKGROUND 80 y.o. year-old female with known advanced CKD5 d/t hypertensive nephrosclerosis, followed by Dr. Justin Mend, but not compliant with appointments. On review office records was non-compliant with appts, cancelled vascular acces surgery with Dr. Oneida Alar in Dec 2014 and refused to start HD when advised in March 2015 by Dr. Justin Mend.  Was brought to the ED by her family because pf progressive weakness, decreased ambulation, recent falls, poor compliance with medications, confusion (since a fall onto a dresser drawer last week), several months of leg edema, poor appetite. She was found to have a creatinine on 7.96, BUN 81, CO2 of 16, elevated troponin of 0.65, Hb 7.8, CXR with mild vascular congestion. CT brain (done b/o recent fall/confusion) no acute change and facial CT just showed left periorbital contusion. TDC was placed by IR on 07/02/13 and HD was initiated for ESRD ASSESSMENT/RECOMMENDATIONS  1. ESRD - Pt/family agreed to catheter placement and HD was initiated 6/20. For 2nd treatment 6/22, 3rd 6/23 then anticipate TTS schedule. CLIP. Save right arm (left handed). Consult VVS in AM for AVF.  2. Anemia - Initiated darbepoetin with HD 100 QSat. Iron stores adequate. Hb today under 6 and pt undergoing PRBC's X 2 3. Secondary hyperpara - on po calcitriol at home with no recent PTH; changed to hectorol with HD. PTH pending for adjustment. Phos normal  4. Hypertension with hypertensive urgency - d/t failure to take BP meds at home. Much improved. Backing off on meds  5. Confusion/AMS - at least in part uremic. CT after recent fall no stroke or bleed.Marland Kitchen MUCH improved after a single dialysis. 6. NSTEMI - presume demand ischemia. Cards is following. Heparin stopped. Advised BB and ASA. Enzymes elev but flat. Echo 55-60% No arrythmia. No curther w/u advised by Dr. Johnsie Cancel. 7. HLD - statin 8. Orbital contusion post fall  Subjective:  Says she feels much better Oriented today Daughter confirms clinical  improvement Edema less Had BP issues with 1st HD - got off about 1200 of a 2 liter goal  Objective Vital signs in last 24 hours: Filed Vitals:   07/03/13 1245 07/03/13 1248 07/03/13 1300 07/03/13 1412  BP: 161/68 161/68 141/65 135/67  Pulse: 63 62 60 65  Temp:  97.4 F (36.3 C)  97.6 F (36.4 C)  TempSrc:  Axillary  Oral  Resp: _0 Height:    5' (1.524 m)  Weight:      SpO2: 100% 100% 100% 100%   Weight change: 6 kg (13 lb 3.6 oz)  Intake/Output Summary (Last 24 hours) at 07/03/13 1521 Last data filed at 07/03/13 1248  Gross per 24 hour  Intake  347.5 ml  Output   1205 ml  Net -857.5 ml   Physical Exam:  Blood pressure 135/67, pulse 65, temperature 97.6 F (36.4 C), temperature source Oral, resp. rate 18, height 5' (1.524 m), weight 76.6 kg (168 lb 14 oz), SpO2 100.00%. Alert, oriented to person and place [facility] right chest dry dressing Lungs with decreased BS No rales S1S2 No S3 No rub Abd obese, soft, not tender Ext with only trace edema and some wrinkling  Recent Labs Lab 07/01/13 2055 07/02/13 2000 07/03/13 0500  NA 142 144 144  K 5.9* 4.2 3.5*  CL 103 105 104  CO2 16* 19 22  GLUCOSE 104* 140* 95  BUN 81* 83* 44*  CREATININE 7.96* 8.12* 5.18*  CALCIUM 9.5 9.1 8.7  PHOS  --  5.5* 3.9    Recent Labs Lab 07/01/13 2055  07/02/13 2000 07/03/13 0500  AST 29  --   --   ALT 9  --   --   ALKPHOS 61  --   --   BILITOT 0.8  --   --   PROT 6.5  --   --   ALBUMIN 2.9* 2.5* 2.4*    Recent Labs Lab 07/01/13 2055  LIPASE 29    Recent Labs Lab 07/01/13 2055 07/02/13 0819 07/03/13 0500  WBC 11.7* 8.8 7.1  HGB 7.8* 7.1* 5.6*  HCT 24.9* 22.7* 17.5*  MCV 83.3 82.2 82.2  PLT 222 184 140*   Recent Labs Lab 07/01/13 2055 07/02/13 0819 07/02/13 1327  TROPONINI 0.65* 0.40* 0.36*    Recent Labs Lab 07/01/13 2048  GLUCAP 97      Recent Labs Lab 07/02/13 0819 07/02/13 1950  IRON 49 62  TIBC 157* 145*  FERRITIN 710*  --     Studies/Results: Dg Chest 2 View  07/01/2013   CLINICAL DATA:  Fall  EXAM: CHEST  2 VIEW  COMPARISON:  Radiograph 12/28/2012  FINDINGS: Cardiac silhouette is mildly enlarged. There is a new right pleural effusion. Concern for right lower lobe airspace disease additionally.  IMPRESSION: 1. New right pleural effusion. 2. Right lobe atelectasis versus infiltrate.   Electronically Signed   By: Suzy Bouchard M.D.   On: 07/01/2013 22:40   Ct Head Wo Contrast  07/01/2013   CLINICAL DATA:  80 year old female with altered mental status following fall. Hypertension.  EXAM: CT HEAD WITHOUT CONTRAST  TECHNIQUE: Contiguous axial images were obtained from the base of the skull through the vertex without intravenous contrast.  COMPARISON:  None.  FINDINGS: Moderate chronic small-vessel white matter ischemic changes are noted.  No acute intracranial abnormalities are identified, including mass lesion or mass effect, hydrocephalus, extra-axial fluid collection, midline shift, hemorrhage, or acute infarction.  The visualized bony calvarium is unremarkable.  Mild left forehead soft tissue swelling is.  IMPRESSION: No evidence of acute intracranial abnormality.  Mild left forehead soft tissue swelling without fracture.  Moderate chronic small-vessel white matter ischemic changes.   Electronically Signed   By: Hassan Rowan M.D.   On: 07/01/2013 21:50   Ir Fluoro Guide Cv Line Right  07/02/2013   INDICATION: 80 year old female with acute on chronic renal failure in need of urgent hemodialysis secondary to uremia. Placement of a tunneled HD catheter is warranted to facilitate urgent hemodialysis.  EXAM: TUNNELED CENTRAL VENOUS HEMODIALYSIS CATHETER PLACEMENT WITH ULTRASOUND AND FLUOROSCOPIC GUIDANCE  MEDICATIONS: 1 g vancomycin The IV antibiotic was given in an appropriate time interval prior to skin puncture.  CONTRAST:  None  ANESTHESIA/SEDATION: Versed 2 mg IV; Fentanyl 50 mcg IV  Total Moderate Sedation Time  Fifteen  minutes.  FLUOROSCOPY TIME:  18 seconds  COMPLICATIONS: None immediate  PROCEDURE: Informed written consent was obtained from the patient's daughter after a discussion of the risks, benefits, and alternatives to treatment. Questions regarding the procedure were encouraged and answered. The right neck and chest were prepped with chlorhexidine in a sterile fashion, and a sterile drape was applied covering the operative field. Maximum barrier sterile technique with sterile gowns and gloves were used for the procedure. A timeout was performed prior to the initiation of the procedure.  After creating a small venotomy incision, a micropuncture kit was utilized to access the right internal jugular vein under direct, real-time ultrasound guidance after the overlying soft tissues were anesthetized with 1% lidocaine with epinephrine. Ultrasound image documentation was performed. The  microwire was kinked to measure appropriate catheter length. A stiff glidewire was advanced to the level of the IVC and the micropuncture sheath was exchanged for a peel-away sheath. A Bard hemo split tunneled hemodialysis catheter measuring 19 cm from tip to cuff was tunneled in a retrograde fashion from the anterior chest wall to the venotomy incision.  The catheter was then placed through the peel-away sheath with tips ultimately positioned within the superior aspect of the right atrium. Final catheter positioning was confirmed and documented with a spot radiographic image. The catheter aspirates and flushes normally. The catheter was flushed with appropriate volume heparin dwells.  The catheter exit site was secured with a 0-Prolene retention suture. The venotomy incision was closed with an interrupted 4-0 Vicryl, Dermabond and Steri-strips. Dressings were applied. The patient tolerated the procedure well without immediate post procedural complication.  IMPRESSION: Successful placement of 19 cm tip to cuff tunneled hemodialysis catheter via  the right internal jugular vein with tips terminating within the superior aspect of the right atrium. The catheter is ready for immediate use.  Signed,  Criselda Peaches, MD  [facility]  [facility]   Electronically Signed   By: Jacqulynn Cadet M.D.   On: 07/02/2013 12:02   Ir US Guide Vasc Access Right  07/02/2013   INDICATION: 80 year old female with acute on chronic renal failure in need of urgent hemodialysis secondary to uremia. Placement of a tunneled HD catheter is warranted to facilitate urgent hemodialysis.  EXAM: TUNNELED CENTRAL VENOUS HEMODIALYSIS CATHETER PLACEMENT WITH ULTRASOUND AND FLUOROSCOPIC GUIDANCE  MEDICATIONS: 1 g vancomycin The IV antibiotic was given in an appropriate time interval prior to skin puncture.  CONTRAST:  None  ANESTHESIA/SEDATION: Versed 2 mg IV; Fentanyl 50 mcg IV  Total Moderate Sedation Time  Fifteen minutes.  FLUOROSCOPY TIME:  18 seconds  COMPLICATIONS: None immediate  PROCEDURE: Informed written consent was obtained from the patient's daughter after a discussion of the risks, benefits, and alternatives to treatment. Questions regarding the procedure were encouraged and answered. The right neck and chest were prepped with chlorhexidine in a sterile fashion, and a sterile drape was applied covering the operative field. Maximum barrier sterile technique with sterile gowns and gloves were used for the procedure. A timeout was performed prior to the initiation of the procedure.  After creating a small venotomy incision, a micropuncture kit was utilized to access the right internal jugular vein under direct, real-time ultrasound guidance after the overlying soft tissues were anesthetized with 1% lidocaine with epinephrine. Ultrasound image documentation was performed. The microwire was kinked to measure appropriate catheter length. A stiff glidewire was advanced to the level of the IVC and the micropuncture sheath was  exchanged for a peel-away sheath. A Bard hemo split tunneled hemodialysis catheter measuring 19 cm from tip to cuff was tunneled in a retrograde fashion from the anterior chest wall to the venotomy incision.  The catheter was then placed through the peel-away sheath with tips ultimately positioned within the superior aspect of the right atrium. Final catheter positioning was confirmed and documented with a spot radiographic image. The catheter aspirates and flushes normally. The catheter was flushed with appropriate volume heparin dwells.  The catheter exit site was secured with a 0-Prolene retention suture. The venotomy incision was closed with an interrupted 4-0 Vicryl, Dermabond and Steri-strips. Dressings were applied. The patient tolerated the procedure well without immediate post procedural complication.  IMPRESSION: Successful placement of 19 cm tip to cuff tunneled hemodialysis  catheter via the right internal jugular vein with tips terminating within the superior aspect of the right atrium. The catheter is ready for immediate use.  Signed,  Criselda Peaches, MD  [facility]  [facility]   Electronically Signed   By: Jacqulynn Cadet M.D.   On: 07/02/2013 12:02   Dg Chest Portable 1 View  07/02/2013   CLINICAL DATA:  Left central line placement.  EXAM: PORTABLE CHEST - 1 VIEW  COMPARISON:  Chest radiograph from 07/01/2013  FINDINGS: A left IJ line is seen ending about the distal SVC.  The small right-sided pleural effusion has improved. Mild residual right basilar airspace opacity is seen. Mild vascular congestion is noted. No pneumothorax is identified.  The cardiomediastinal silhouette is borderline enlarged. No acute osseous abnormalities are seen.  IMPRESSION: 1. Left IJ line seen ending about the distal SVC. 2. Small right-sided pleural effusion has improved. Mild residual right basilar airspace opacity may reflect atelectasis or pneumonia. Mild  vascular congestion and borderline cardiomegaly.   Electronically Signed   By: Garald Balding M.D.   On: 07/02/2013 05:03   Ct Maxillofacial Wo Cm  07/01/2013   CLINICAL DATA:  Fall, left black eye.  EXAM: CT MAXILLOFACIAL WITHOUT CONTRAST  TECHNIQUE: Multidetector CT imaging of the maxillofacial structures was performed. Multiplanar CT image reconstructions were also generated. A small metallic BB was placed on the right temple in order to reliably differentiate right from left.  COMPARISON:  None.  FINDINGS: Left periorbital/supra or soft tissue swelling with contusion is present. Hyperdensity within the left anterior chamber itself may represent blood. The globes are intact without evidence of acute injury. No retro-orbital hematoma or other pathology. Intraconal and extraconal fat are well well preserved.  The bony orbits are intact without evidence of orbital floor fracture. Zygomatic arches are intact.  Maxilla is intact. No nasal bone fracture. Mandible is intact. Mandibular condyles are normally positioned within the temporomandibular fossa.  Paranasal sinuses are well pneumatized and free of fluid. No mastoid effusion.  A advanced atrophy noted within the partially visualized right  IMPRESSION: 1. Left periorbital/supraorbital contusion. 2. Intact globes. Hyperdensity within the left anterior chamber suggestive of hemorrhage. Correlation with physical recommended. No retro-orbital hematoma or other pathology. 3. No acute maxillofacial fracture.   Electronically Signed   By: Jeannine Boga M.D.   On: 07/01/2013 22:33   Medications:   . aspirin  325 mg Oral Daily  . atorvastatin  40 mg Oral q1800  . Chlorhexidine Gluconate Cloth  6 each Topical Q0600  . cloNIDine  0.1 mg Oral BID  . darbepoetin (ARANESP) injection - DIALYSIS  100 mcg Intravenous Q Sat-HD  . doxazosin  4 mg Oral BID  . doxercalciferol  1 mcg Intravenous Q T,Th,Sa-HD  . enoxaparin (LOVENOX) injection  30 mg Subcutaneous Q24H   . labetalol  200 mg Oral BID  . multivitamin with minerals  1 tablet Oral Daily  . mupirocin ointment  1 application Nasal BID   Jamal Maes, MD [facility] [phone] pager 07/03/2013, 3:21 PM

## 2013-07-04 DIAGNOSIS — N185 Chronic kidney disease, stage 5: Secondary | ICD-10-CM

## 2013-07-04 LAB — TYPE AND SCREEN
ABO/RH(D): A POS
Antibody Screen: NEGATIVE
UNIT DIVISION: 0
UNIT DIVISION: 0

## 2013-07-04 LAB — CBC
HEMATOCRIT: 27 % — AB (ref 36.0–46.0)
HEMOGLOBIN: 8.8 g/dL — AB (ref 12.0–15.0)
MCH: 27.2 pg (ref 26.0–34.0)
MCHC: 32.6 g/dL (ref 30.0–36.0)
MCV: 83.3 fL (ref 78.0–100.0)
Platelets: 145 10*3/uL — ABNORMAL LOW (ref 150–400)
RBC: 3.24 MIL/uL — AB (ref 3.87–5.11)
RDW: 13.6 % (ref 11.5–15.5)
WBC: 8.1 10*3/uL (ref 4.0–10.5)

## 2013-07-04 LAB — RENAL FUNCTION PANEL
ALBUMIN: 2.7 g/dL — AB (ref 3.5–5.2)
BUN: 45 mg/dL — ABNORMAL HIGH (ref 6–23)
CALCIUM: 8.6 mg/dL (ref 8.4–10.5)
CO2: 22 mEq/L (ref 19–32)
Chloride: 103 mEq/L (ref 96–112)
Creatinine, Ser: 5.89 mg/dL — ABNORMAL HIGH (ref 0.50–1.10)
GFR, EST AFRICAN AMERICAN: 7 mL/min — AB (ref >90)
GFR, EST NON AFRICAN AMERICAN: 6 mL/min — AB (ref >90)
GLUCOSE: 104 mg/dL — AB (ref 70–99)
PHOSPHORUS: 3.7 mg/dL (ref 2.3–4.6)
POTASSIUM: 3.3 meq/L — AB (ref 3.7–5.3)
Sodium: 143 mEq/L (ref 137–147)

## 2013-07-04 LAB — PTH, INTACT AND CALCIUM
CALCIUM TOTAL (PTH): 8.8 mg/dL (ref 8.4–10.5)
PTH: 259.2 pg/mL — ABNORMAL HIGH (ref 14.0–72.0)

## 2013-07-04 MED ORDER — HEPARIN SODIUM (PORCINE) 1000 UNIT/ML IJ SOLN
1400.0000 [IU] | Freq: Once | INTRAMUSCULAR | Status: AC
  Administered 2013-07-04: 1400 [IU] via INTRAVENOUS
  Filled 2013-07-04: qty 1.4

## 2013-07-04 NOTE — Progress Notes (Signed)
BACKGROUND 80 y.o. year-old female with known advanced CKD5 d/t hypertensive nephrosclerosis, followed by Dr. Justin Mend, but not compliant. On review office records was non-compliant with appts, cancelled vascular acces surgery with Dr. Oneida Alar in Dec 2014 and refused to start HD when advised in March 2015 by Dr. Justin Mend.  Was brought to the ED by her family because of uremic symptoms. She was found to have a creatinine on 7.96, BUN 81, CO2 of 16, elevated troponin of 0.65, Hb 7.8, CXR with mild vascular congestion. CT brain (done b/o recent fall/confusion) no acute change and facial CT just showed left periorbital contusion. TDC was placed by IR on 07/02/13 and HD was initiated for ESRD  ASSESSMENT/RECOMMENDATIONS  1. ESRD - Pt/family agreed to catheter placement and HD was initiated 6/20. For 2nd treatment today, 3rd 6/23 then anticipate TTS schedule. CLIP. Save right arm (left handed). Consult VVS  for AVF.  2. Anemia - Initiated darbepoetin with HD 100 QSat. Iron stores adequate. S/p transfusion Sunday 3. Secondary hyperpara - on po calcitriol at home with no recent PTH; changed to hectorol with HD. PTH pending for adjustment. Phos normal  4. Hypertension with hypertensive urgency - d/t failure to take BP meds at home. Much improved. Backing off on meds  5. Confusion/AMS - at least in part uremic. CT after recent fall no stroke or bleed.Marland Kitchen MUCH improved after a single dialysis. But seems worse today 6. NSTEMI - presume demand ischemia. Cards is following. Heparin stopped. Advised BB and ASA. Enzymes elev but flat. Echo 55-60% No arrythmia. No curther w/u advised by Dr. Johnsie Cancel. 7. HLD - statin 8. Orbital contusion post fall  Subjective:  Confused in dialysis today, thinks she is at home  Edema less   Objective Vital signs in last 24 hours: Filed Vitals:   07/04/13 0843 07/04/13 0917 07/04/13 0949 07/04/13 1020  BP: 160/104 160/101 160/101 147/102  Pulse: 70 61 73 65  Temp:      TempSrc:       Resp: _0 Height:      Weight:      SpO2: 97% 100% 96% 100%   Weight change: -1.9 kg (-4 lb 3 oz)  Intake/Output Summary (Last 24 hours) at 07/04/13 1047 Last data filed at 07/04/13 0529  Gross per 24 hour  Intake  697.5 ml  Output    200 ml  Net  497.5 ml   Physical Exam:  Blood pressure 135/67, pulse 65, temperature 97.6 F (36.4 C), temperature source Oral, resp. rate 18, height 5' (1.524 m), weight 76.6 kg (168 lb 14 oz), SpO2 100.00%. Alert, but confused TDC right chest dry dressing Lungs with decreased BS No rales S1S2 No S3 No rub Abd obese, soft, not tender Ext with only trace edema and some wrinkling  Recent Labs Lab 07/01/13 2055 07/02/13 2000 07/03/13 0500 07/04/13 0309  NA 142 144 144 143  K 5.9* 4.2 3.5* 3.3*  CL 103 105 104 103  CO2 16* _1 GLUCOSE 104* 140* 95 104*  BUN 81* 83* 44* 45*  CREATININE 7.96* 8.12* 5.18* 5.89*  CALCIUM 9.5 9.1 8.7 8.6  PHOS  --  5.5* 3.9 3.7    Recent Labs Lab 07/01/13 2055 07/02/13 2000 07/03/13 0500 07/04/13 0309  AST 29  --   --   --   ALT 9  --   --   --   ALKPHOS 61  --   --   --   BILITOT  0.8  --   --   --   PROT 6.5  --   --   --   ALBUMIN 2.9* 2.5* 2.4* 2.7*    Recent Labs Lab 07/01/13 2055  LIPASE 29    Recent Labs Lab 07/01/13 2055 07/02/13 0819 07/03/13 0500 07/04/13 0309  WBC 11.7* 8.8 7.1 8.1  HGB 7.8* 7.1* 5.6* 8.8*  HCT 24.9* 22.7* 17.5* 27.0*  MCV 83.3 82.2 82.2 83.3  PLT 222 184 140* 145*    Recent Labs Lab 07/01/13 2055 07/02/13 0819 07/02/13 1327  TROPONINI 0.65* 0.40* 0.36*    Recent Labs Lab 07/01/13 2048  GLUCAP 97       Recent Labs Lab 07/02/13 0819 07/02/13 1950  IRON 49 62  TIBC 157* 145*  FERRITIN 710*  --    Studies/Results: Ir Fluoro Guide Cv Line Right  07/02/2013   INDICATION: 80 year old female with acute on chronic renal failure in need of urgent hemodialysis secondary to uremia. Placement of a tunneled HD catheter is warranted  to facilitate urgent hemodialysis.  EXAM: TUNNELED CENTRAL VENOUS HEMODIALYSIS CATHETER PLACEMENT WITH ULTRASOUND AND FLUOROSCOPIC GUIDANCE  MEDICATIONS: 1 g vancomycin The IV antibiotic was given in an appropriate time interval prior to skin puncture.  CONTRAST:  None  ANESTHESIA/SEDATION: Versed 2 mg IV; Fentanyl 50 mcg IV  Total Moderate Sedation Time  Fifteen minutes.  FLUOROSCOPY TIME:  18 seconds  COMPLICATIONS: None immediate  PROCEDURE: Informed written consent was obtained from the patient's daughter after a discussion of the risks, benefits, and alternatives to treatment. Questions regarding the procedure were encouraged and answered. The right neck and chest were prepped with chlorhexidine in a sterile fashion, and a sterile drape was applied covering the operative field. Maximum barrier sterile technique with sterile gowns and gloves were used for the procedure. A timeout was performed prior to the initiation of the procedure.  After creating a small venotomy incision, a micropuncture kit was utilized to access the right internal jugular vein under direct, real-time ultrasound guidance after the overlying soft tissues were anesthetized with 1% lidocaine with epinephrine. Ultrasound image documentation was performed. The microwire was kinked to measure appropriate catheter length. A stiff glidewire was advanced to the level of the IVC and the micropuncture sheath was exchanged for a peel-away sheath. A Bard hemo split tunneled hemodialysis catheter measuring 19 cm from tip to cuff was tunneled in a retrograde fashion from the anterior chest wall to the venotomy incision.  The catheter was then placed through the peel-away sheath with tips ultimately positioned within the superior aspect of the right atrium. Final catheter positioning was confirmed and documented with a spot radiographic image. The catheter aspirates and flushes normally. The catheter was flushed with appropriate volume heparin dwells.   The catheter exit site was secured with a 0-Prolene retention suture. The venotomy incision was closed with an interrupted 4-0 Vicryl, Dermabond and Steri-strips. Dressings were applied. The patient tolerated the procedure well without immediate post procedural complication.  IMPRESSION: Successful placement of 19 cm tip to cuff tunneled hemodialysis catheter via the right internal jugular vein with tips terminating within the superior aspect of the right atrium. The catheter is ready for immediate use.  Signed,  Criselda Peaches, MD  [facility]  [facility]   Electronically Signed   By: Jacqulynn Cadet M.D.   On: 07/02/2013 12:02   Ir US Guide Vasc Access Right  07/02/2013   INDICATION: 80 year old female with acute on chronic  renal failure in need of urgent hemodialysis secondary to uremia. Placement of a tunneled HD catheter is warranted to facilitate urgent hemodialysis.  EXAM: TUNNELED CENTRAL VENOUS HEMODIALYSIS CATHETER PLACEMENT WITH ULTRASOUND AND FLUOROSCOPIC GUIDANCE  MEDICATIONS: 1 g vancomycin The IV antibiotic was given in an appropriate time interval prior to skin puncture.  CONTRAST:  None  ANESTHESIA/SEDATION: Versed 2 mg IV; Fentanyl 50 mcg IV  Total Moderate Sedation Time  Fifteen minutes.  FLUOROSCOPY TIME:  18 seconds  COMPLICATIONS: None immediate  PROCEDURE: Informed written consent was obtained from the patient's daughter after a discussion of the risks, benefits, and alternatives to treatment. Questions regarding the procedure were encouraged and answered. The right neck and chest were prepped with chlorhexidine in a sterile fashion, and a sterile drape was applied covering the operative field. Maximum barrier sterile technique with sterile gowns and gloves were used for the procedure. A timeout was performed prior to the initiation of the procedure.  After creating a small venotomy incision, a micropuncture kit was utilized to access  the right internal jugular vein under direct, real-time ultrasound guidance after the overlying soft tissues were anesthetized with 1% lidocaine with epinephrine. Ultrasound image documentation was performed. The microwire was kinked to measure appropriate catheter length. A stiff glidewire was advanced to the level of the IVC and the micropuncture sheath was exchanged for a peel-away sheath. A Bard hemo split tunneled hemodialysis catheter measuring 19 cm from tip to cuff was tunneled in a retrograde fashion from the anterior chest wall to the venotomy incision.  The catheter was then placed through the peel-away sheath with tips ultimately positioned within the superior aspect of the right atrium. Final catheter positioning was confirmed and documented with a spot radiographic image. The catheter aspirates and flushes normally. The catheter was flushed with appropriate volume heparin dwells.  The catheter exit site was secured with a 0-Prolene retention suture. The venotomy incision was closed with an interrupted 4-0 Vicryl, Dermabond and Steri-strips. Dressings were applied. The patient tolerated the procedure well without immediate post procedural complication.  IMPRESSION: Successful placement of 19 cm tip to cuff tunneled hemodialysis catheter via the right internal jugular vein with tips terminating within the superior aspect of the right atrium. The catheter is ready for immediate use.  Signed,  Criselda Peaches, MD  [facility]  [facility]   Electronically Signed   By: Jacqulynn Cadet M.D.   On: 07/02/2013 12:02   Medications:   . aspirin  325 mg Oral Daily  . atorvastatin  40 mg Oral q1800  . Chlorhexidine Gluconate Cloth  6 each Topical Q0600  . cloNIDine  0.1 mg Oral BID  . darbepoetin (ARANESP) injection - DIALYSIS  100 mcg Intravenous Q Sat-HD  . doxazosin  4 mg Oral BID  . doxercalciferol  1 mcg Intravenous Q T,Th,Sa-HD  . enoxaparin  (LOVENOX) injection  30 mg Subcutaneous Q24H  . labetalol  200 mg Oral BID  . multivitamin with minerals  1 tablet Oral Daily  . mupirocin ointment  1 application Nasal BID   GOLDSBOROUGH,KELLIE A   07/04/2013, 10:47 AM

## 2013-07-04 NOTE — Progress Notes (Signed)
[facility] PROGRESS NOTE  Jaclyn Morgan HYQ:[id number] DOB: 1932/08/12 DOA: 07/01/2013 PCP: Scarlette Calico, MD  Assessment/Plan: 1. ESRD/volume overload/hyperkalemia, no distress  -s/p HD catheter placement, HD started 6/20 -BP much improved, cut down meds -needs permanent access -Per Renal  2. Accelerated Hypertension -much improved post HD -resume clonidine, cut down labetalol  3. Anemia of chronic disease  -drop in Hb, no overt blood loss -anemia panel c/w chronic disease -transfused 2 units PRBC on 6/21 -needs aranesp  4. Metabolic encephalopathy  -improving, suspect may have some baseline cognitive dysfunction too  5. NSTEMI/Demand ischemia  -No clinical symptoms of angina/EKG unremarkable -Echo normal EF 55-60% with no RWMA's  -per cards no need for further cardiac w/u   6. Fall/L orbital contusion   7. Hyperkalemia -resolved with kayexalate, HD  DVT proph: lovenox  Code Status: Full Code Family Communication: none at bedside Disposition Plan: [place] when outpt HD set up   Consultants:  Renal  Cardiology  HPI/Subjective: Feels ok, no complaints  Objective: Filed Vitals:   07/04/13 1116  BP: 180/102  Pulse: 72  Temp: 97.9 F (36.6 C)  Resp: 21    Intake/Output Summary (Last 24 hours) at 07/04/13 1128 Last data filed at 07/04/13 1116  Gross per 24 hour  Intake    685 ml  Output   1200 ml  Net   -515 ml   Filed Weights   07/04/13 0444 07/04/13 0710 07/04/13 1116  Weight: 72.031 kg (158 lb 12.8 oz) 73.9 kg (162 lb 14.7 oz) 72.9 kg (160 lb 11.5 oz)    Exam:   General:  AAOx to self, place, much improved cognition  Cardiovascular: S1S2/RRR  Respiratory: CTAB  Abdomen: soft, Nt, BS present  Musculoskeletal: trace edema c/c   Data Reviewed: Basic Metabolic Panel:  Recent Labs Lab 07/01/13 2055 07/02/13 2000 07/03/13 0500 07/04/13 0309  NA 142 144 144 143  K 5.9* 4.2 3.5* 3.3*  CL 103 105 104 103  CO2 16* 19 22 22    GLUCOSE 104* 140* 95 104*  BUN 81* 83* 44* 45*  CREATININE 7.96* 8.12* 5.18* 5.89*  CALCIUM 9.5 9.1 8.7 8.6  PHOS  --  5.5* 3.9 3.7   Liver Function Tests:  Recent Labs Lab 07/01/13 2055 07/02/13 2000 07/03/13 0500 07/04/13 0309  AST 29  --   --   --   ALT 9  --   --   --   ALKPHOS 61  --   --   --   BILITOT 0.8  --   --   --   PROT 6.5  --   --   --   ALBUMIN 2.9* 2.5* 2.4* 2.7*    Recent Labs Lab 07/01/13 2055  LIPASE 29   No results found for this basename: AMMONIA,  in the last 168 hours CBC:  Recent Labs Lab 07/01/13 2055 07/02/13 0819 07/03/13 0500 07/04/13 0309  WBC 11.7* 8.8 7.1 8.1  HGB 7.8* 7.1* 5.6* 8.8*  HCT 24.9* 22.7* 17.5* 27.0*  MCV 83.3 82.2 82.2 83.3  PLT 222 184 140* 145*   Cardiac Enzymes:  Recent Labs Lab 07/01/13 2055 07/02/13 0819 07/02/13 1327  TROPONINI 0.65* 0.40* 0.36*   BNP (last 3 results)  Recent Labs  07/01/13 2055  PROBNP >70000.0*   CBG:  Recent Labs Lab 07/01/13 2048  GLUCAP 97    Recent Results (from the past 240 hour(s))  CULTURE, BLOOD (ROUTINE X 2)     Status: None   Collection  Time    07/01/13 11:41 PM      Result Value Ref Range Status   Specimen Description BLOOD RIGHT ANTECUBITAL   Final   Special Requests BOTTLES DRAWN AEROBIC AND ANAEROBIC 5CC   Final   Culture  Setup Time     Final   Value: 07/02/2013 04:07     Performed at [facility]   Culture     Final   Value: STAPHYLOCOCCUS SPECIES     Note: Gram Stain Report Called to,Read Back By and Verified With: PEGGY GERHARD 07/03/13 @ 5:59PM BY RUSCOE A.     Performed at [facility]   Report Status PENDING   Incomplete  CULTURE, BLOOD (ROUTINE X 2)     Status: None   Collection Time    07/01/13 11:51 PM      Result Value Ref Range Status   Specimen Description BLOOD RIGHT HAND   Final   Special Requests BOTTLES DRAWN AEROBIC ONLY 5CC   Final   Culture  Setup Time     Final   Value: 07/02/2013 04:08     Performed at  [facility]   Culture     Final   Value:        BLOOD CULTURE RECEIVED NO GROWTH TO DATE CULTURE WILL BE HELD FOR 5 DAYS BEFORE ISSUING A FINAL NEGATIVE REPORT     Performed at [facility]   Report Status PENDING   Incomplete  MRSA PCR SCREENING     Status: Abnormal   Collection Time    07/02/13  5:15 AM      Result Value Ref Range Status   MRSA by PCR POSITIVE (*) NEGATIVE Final   Comment:            The GeneXpert MRSA Assay (FDA     approved for NASAL specimens     only), is one component of a     comprehensive MRSA colonization     surveillance program. It is not     intended to diagnose MRSA     infection nor to guide or     monitor treatment for     MRSA infections.     RESULT CALLED TO, READ BACK BY AND VERIFIED WITH:     MUHORO,D RN 07/02/13 [place]     Studies: No results found.  Scheduled Meds: . aspirin  325 mg Oral Daily  . atorvastatin  40 mg Oral q1800  . Chlorhexidine Gluconate Cloth  6 each Topical Q0600  . cloNIDine  0.1 mg Oral BID  . darbepoetin (ARANESP) injection - DIALYSIS  100 mcg Intravenous Q Sat-HD  . doxazosin  4 mg Oral BID  . doxercalciferol  1 mcg Intravenous Q T,Th,Sa-HD  . enoxaparin (LOVENOX) injection  30 mg Subcutaneous Q24H  . labetalol  200 mg Oral BID  . multivitamin with minerals  1 tablet Oral Daily  . mupirocin ointment  1 application Nasal BID   Continuous Infusions:  Antibiotics Given (last 72 hours)   Date/Time Action Medication Dose Rate   07/02/13 1122 Given   vancomycin (VANCOCIN) IVPB 1000 mg/200 mL premix 1,000 mg 200 mL/hr      Principal Problem:   Uremia Active Problems:   Unspecified hypothyroidism   Renal failure   Metabolic encephalopathy   CKD (chronic kidney disease), stage IV   Hypertensive urgency   CAP (community acquired pneumonia)   Acute diastolic CHF (congestive heart failure)   Anemia of renal disease  Hyperkalemia   Elevated troponin    Time spent:  38min    JOSEPH,PREETHA  [facility] Pager [phone]. If 7PM-7AM, please contact night-coverage at [email or web address], password [facility] 07/04/2013, 11:28 AM  LOS: 3 days

## 2013-07-04 NOTE — Procedures (Signed)
Patient was seen on dialysis and the procedure was supervised.  BFR 300  Via PC BP is  147/102.   Patient appears to be tolerating treatment well  GOLDSBOROUGH,KELLIE A 07/04/2013

## 2013-07-04 NOTE — Clinical Documentation Improvement (Signed)
  Please clarify Hypertensive Urgency. Thank you.  Possible Clinical Conditions?   Accelerated Hypertension Malignant Hypertension Or Other Condition__________ Cannot Clinically Determine  Supporting Information: Diagnosis of Hypertensive Urgency  Risk Factors: ESRD w/ history of refusal of HD Noncompliance with hypertension medication  Signs and Symptoms: 6/19 BP 202/148 6/20 BP: 232/130; 217/115; 196/100 6/21 BP: 170/146; 137/117;  179/68; 187/90 6/22 BP: 154/67; 148/76; 168/102  Treatment Hemodialysis Catapres 0.1mg  bid Apresoline 10mg  IV q6h prn Normodyne 200mg  bid Frequent VS  Thank You, Estella Husk ,RN Clinical Documentation Specialist:  [employer]

## 2013-07-04 NOTE — Consult Note (Signed)
[facility]  Reason for Consult:  In need of permanent HD access Referring Physician:  Moshe Cipro  MRN #:  [id number]  History of Present Illness: This is a 80 y.o. female who was admitted to the hospital 3 days ago with increasing weakness, increasing falls, poor compliance with medications, and confusion.  Upon her admission to the ED, her creatinine was 7.6 as well as an elevated troponin of 0.65 and anemia with hemoglobin of 7.8.  She did have a CT scan of the brain due to her fall and there were no acute changes.  Facial CT revealed left periorbital contusions.  She did have a diatek catheter placement 07/02/13 by IR and she was started on HD.     She was set to see Dr. Oneida Alar in December of 2014, but did not show for the appt.  [facility] is consulted for permanent HD access placement.  She is on a statin for her cholesterol and she is on a beta blocker for her HTN.  She also takes an aspirin daily.  This history is taken from the chart as the pt is confused and there is no family around.  Past Medical History  Diagnosis Date  . Anemia   . Hypertension   . Hyperlipidemia   . Chronic kidney disease   . Hypothyroidism   . End-stage renal disease (ESRD)   . Urinary incontinence, functional   . Anxiety    Past Surgical History  Procedure Laterality Date  . Cataract extraction  06/07/2010  . Transthoracic echocardiogram  10/2009    EF=>55%, mild conc LVH; LA mildly dilated; mild mitral annular calcif, mild MR; mild TR, RVSP 40-94mmHg; AV mildly sclerotic; mild pulm valve regurg; aortic root sclerosis/calcif   . Nm myocar perf wall motion  10/2009    dipyridamole; mild-mod ischemia in apical anterior, basal inferolateral, mid inferoalteral region; post-stress EF 66%; high risk     Allergies  Allergen Reactions  . Norvasc [Amlodipine Besylate] Nausea Only  . Zocor [Simvastatin] Other (See Comments)    Kidney Issues?    Prior to Admission  medications   Medication Sig Start Date End Date Taking? Authorizing Provider  acetaminophen (TYLENOL) 500 MG tablet Take 1,000 mg by mouth 2 (two) times daily as needed (pain).   Yes Historical Provider, MD  aspirin (BAYER ASPIRIN) 325 MG tablet Take 325 mg by mouth daily.     Yes Historical Provider, MD  calcitRIOL (ROCALTROL) 0.25 MCG capsule Take 0.25 mcg by mouth every Monday, Wednesday, and Friday.    Yes Historical Provider, MD  cloNIDine (CATAPRES) 0.1 MG tablet Take 0.1 mg by mouth daily.    Yes Historical Provider, MD  doxazosin (CARDURA) 2 MG tablet Take 4 mg by mouth 2 (two) times daily.    Yes Historical Provider, MD  ferrous sulfate 325 (65 FE) MG tablet Take 650 mg by mouth daily with breakfast.   Yes Historical Provider, MD  furosemide (LASIX) 40 MG tablet Take 1 tablet (40 mg total) by mouth daily. No further refills without an appointment.   Yes Pixie Casino, MD  labetalol (NORMODYNE) 200 MG tablet Take 200 mg by mouth 3 (three) times daily.    Yes Historical Provider, MD  Multiple Vitamin (MULTIVITAMIN WITH MINERALS) TABS tablet Take 1 tablet by mouth daily.   Yes Historical Provider, MD  rosuvastatin (CRESTOR) 20 MG tablet Take 20 mg by mouth every evening.   Yes Historical Provider, MD    History  Social History  . Marital Status: Widowed    Spouse Name: N/A    Number of Children: 3  . Years of Education: B.A.    Occupational History  . Not on file.   Social History Main Topics  . Smoking status: Never Smoker   . Smokeless tobacco: Never Used  . Alcohol Use: No  . Drug Use: No  . Sexual Activity: Not Currently   Other Topics Concern  . Not on file   Social History Narrative  . No narrative on file     Family History  Problem Relation Age of Onset  . Hyperlipidemia Other   . Hypertension Other   . Kidney disease Other   . Diabetes Other   . Hypertension Mother   . Diabetes Daughter   . Hypertension Daughter   . Cancer Son   . Cancer Sister       ROS: [x]  Positive   [ ]  Negative   [ ]  All sytems reviewed and are negative Unable to obtain with exception of what is noted  Cardiovascular: []  chest pain/pressure []  palpitations []  SOB lying flat []  DOE []  pain in legs while walking []  pain in legs at rest []  pain in legs at night []  non-healing ulcers []  hx of DVT [x]  swelling in legs  Pulmonary: []  productive cough []  asthma/wheezing []  home O2  Neurologic: [x]  weakness  []  numbness in []  arms []  legs []  hx of CVA []  mini stroke [] difficulty speaking or slurred speech []  temporary loss of vision in one eye []  dizziness [x]  AMS  Hematologic: []  hx of cancer []  bleeding problems []  problems with blood clotting easily  Endocrine:   []  diabetes []  thyroid disease  GI []  vomiting blood []  blood in stool  GU: [x]  CKD/renal failure []  HD--[]  M/W/F or []  T/T/S []  burning with urination []  blood in urine  Psychiatric: []  anxiety []  depression  Musculoskeletal: []  arthritis []  joint pain  Integumentary: []  rashes []  ulcers  Constitutional: []  fever []  chills   Physical Examination  Filed Vitals:   07/04/13 1500  BP:   Pulse:   Temp: 97.7 F (36.5 C)  Resp:    Body mass index is 31.39 kg/(m^2).  General:  WDWN in NAD Gait: Not observed HENT: WNL, normocephalic Eyes: Pupils equal Pulmonary: normal non-labored breathing, without Rales, rhonchi,  wheezing Cardiac: regular, without  Murmurs, rubs or gallops; Abdomen: soft, NT/ND, no masses Skin: without rashes, without ulcers  Vascular Exam/Pulses:  Right Left  Radial 2+ (normal) 2+ (normal)  Ulnar Unable to palpate (pt not cooperative) Unable to palpate (pt not cooperative)  Femoral 2+ (normal) 2+ (normal)  DP 2+ (normal) 2+ (normal)   Extremities: without ischemic changes, without Gangrene , without cellulitis; without open wounds; IV is in place in the left antecubital space Musculoskeletal: no muscle wasting or  atrophy  Neurologic: she is not oriented to present and is confused ; SENSATION: normal; MOTOR FUNCTION:  moving all extremities equally. Speech is fluent/normal Psychiatric:  Pleasantly Confused Lymph:  No inguinal lymphadenopathy   CBC    Component Value Date/Time   WBC 8.1 07/04/2013 0309   WBC 6.2 06/02/2012 1255   RBC 3.24* 07/04/2013 0309   RBC 2.70* 07/02/2013 0819   RBC 4.25 06/02/2012 1255   HGB 8.8* 07/04/2013 0309   HGB 11.3* 06/02/2012 1255   HCT 27.0* 07/04/2013 0309   HCT 35.7 06/02/2012 1255   PLT 145* 07/04/2013 0309   PLT 180 06/02/2012 1255  MCV 83.3 07/04/2013 0309   MCV 84.0 06/02/2012 1255   MCH 27.2 07/04/2013 0309   MCH 26.6 06/02/2012 1255   MCHC 32.6 07/04/2013 0309   MCHC 31.7 06/02/2012 1255   RDW 13.6 07/04/2013 0309   RDW 12.9 06/02/2012 1255   LYMPHSABS 1.0 07/23/2012 1358   LYMPHSABS 1.1 06/02/2012 1255   MONOABS 0.4 07/23/2012 1358   MONOABS 0.4 06/02/2012 1255   EOSABS 0.2 07/23/2012 1358   EOSABS 0.2 06/02/2012 1255   BASOSABS 0.0 07/23/2012 1358   BASOSABS 0.0 06/02/2012 1255    BMET    Component Value Date/Time   NA 143 07/04/2013 0309   NA 139 06/02/2012 1255   K 3.3* 07/04/2013 0309   K 3.7 06/02/2012 1255   CL 103 07/04/2013 0309   CL 104 06/02/2012 1255   CO2 22 07/04/2013 0309   CO2 25 06/02/2012 1255   GLUCOSE 104* 07/04/2013 0309   GLUCOSE 124* 06/02/2012 1255   BUN 45* 07/04/2013 0309   BUN 70.5* 06/02/2012 1255   CREATININE 5.89* 07/04/2013 0309   CREATININE 4.5 Repeated and Verified* 06/02/2012 1255   CALCIUM 8.6 07/04/2013 0309   CALCIUM 8.8 07/02/2013 1950   CALCIUM 9.1 06/02/2012 1255   GFRNONAA 6* 07/04/2013 0309   GFRAA 7* 07/04/2013 0309    COAGS: Lab Results  Component Value Date   INR 1.05 07/02/2013     Non-Invasive Vascular Imaging:  There is vein mapping from December, but Cephalic vein is poor quality.  Will see what repeat vein mapping shows.  Statin:  yes Beta Blocker:  yes Aspirin:  yes ACEI:  no ARB:  no Other  antiplatelets/anticoagulants:  no   ASSESSMENT: This is a 80 y.o. female who has CKD5 in need of permanent HD access.    PLAN: -will plan for permanent access on Wednesday depending on what the vein mapping shows.  She will need to have family or POA around as she is confused and unable to make informed decision.   Leontine Locket, PA-C [facility] [phone]  I have examined the patient, reviewed and agree with above. I do not know her baseline mental status. Extremely confused currently. No concept of her treatment plan. She had seen Dr. Oneida Alar in our office and had scheduled for a left arm basilic vein transposition versus AV graft. She had very marginal surface veins bilaterally. She did not follow through with plan surgery in December. Now admitted with uremia. She is left-handed. She has multiple IVs in her left arm currently and her right arm is being saved. She has multiple bruises over right arm for multiple venipunctures. Doubt she will be a good candidate for fistula. We will repeat her venogram to determine her current venous status. As a tunneled catheter which was placed by interventional radiology for acute dialysis. We will coordinate long-term access while an inpatient.  EARLY, TODD, MD 07/04/2013 5:47 PM

## 2013-07-04 NOTE — Progress Notes (Signed)
Utilization review completed. Bertha Stanfill, RN, BSN. 

## 2013-07-05 DIAGNOSIS — D631 Anemia in chronic kidney disease: Secondary | ICD-10-CM

## 2013-07-05 DIAGNOSIS — N039 Chronic nephritic syndrome with unspecified morphologic changes: Secondary | ICD-10-CM

## 2013-07-05 DIAGNOSIS — N19 Unspecified kidney failure: Secondary | ICD-10-CM

## 2013-07-05 LAB — RENAL FUNCTION PANEL
ALBUMIN: 2.6 g/dL — AB (ref 3.5–5.2)
BUN: 21 mg/dL (ref 6–23)
CHLORIDE: 103 meq/L (ref 96–112)
CO2: 25 mEq/L (ref 19–32)
CREATININE: 4.09 mg/dL — AB (ref 0.50–1.10)
Calcium: 8.4 mg/dL (ref 8.4–10.5)
GFR calc Af Amer: 11 mL/min — ABNORMAL LOW (ref >90)
GFR calc non Af Amer: 10 mL/min — ABNORMAL LOW (ref >90)
GLUCOSE: 101 mg/dL — AB (ref 70–99)
Phosphorus: 2.8 mg/dL (ref 2.3–4.6)
Potassium: 3.4 mEq/L — ABNORMAL LOW (ref 3.7–5.3)
Sodium: 143 mEq/L (ref 137–147)

## 2013-07-05 LAB — CBC
HCT: 27.4 % — ABNORMAL LOW (ref 36.0–46.0)
HEMATOCRIT: 27.3 % — AB (ref 36.0–46.0)
Hemoglobin: 8.8 g/dL — ABNORMAL LOW (ref 12.0–15.0)
Hemoglobin: 8.8 g/dL — ABNORMAL LOW (ref 12.0–15.0)
MCH: 27.2 pg (ref 26.0–34.0)
MCH: 27.4 pg (ref 26.0–34.0)
MCHC: 32.2 g/dL (ref 30.0–36.0)
MCHC: 32.1 g/dL (ref 30.0–36.0)
MCV: 84.5 fL (ref 78.0–100.0)
MCV: 85.4 fL (ref 78.0–100.0)
PLATELETS: 151 10*3/uL (ref 150–400)
PLATELETS: 159 10*3/uL (ref 150–400)
RBC: 3.23 MIL/uL — ABNORMAL LOW (ref 3.87–5.11)
RBC: 3.21 MIL/uL — AB (ref 3.87–5.11)
RDW: 14.1 % (ref 11.5–15.5)
RDW: 14 % (ref 11.5–15.5)
WBC: 7.2 10*3/uL (ref 4.0–10.5)
WBC: 7.1 10*3/uL (ref 4.0–10.5)

## 2013-07-05 MED ORDER — DOXERCALCIFEROL 4 MCG/2ML IV SOLN
INTRAVENOUS | Status: AC
  Filled 2013-07-05: qty 2

## 2013-07-05 MED ORDER — CLONIDINE HCL 0.1 MG PO TABS
0.1000 mg | ORAL_TABLET | Freq: Once | ORAL | Status: AC
  Administered 2013-07-05: 0.1 mg via ORAL
  Filled 2013-07-05 (×3): qty 1

## 2013-07-05 MED ORDER — HEPARIN SODIUM (PORCINE) 1000 UNIT/ML DIALYSIS
20.0000 [IU]/kg | INTRAMUSCULAR | Status: DC | PRN
  Filled 2013-07-05: qty 2

## 2013-07-05 NOTE — Progress Notes (Signed)
VASCULAR LAB PRELIMINARY  PRELIMINARY  PRELIMINARY  PRELIMINARY  Right  Upper Extremity Vein Map    Cephalic  Segment Diameter Comment  1. Axilla 2.36mm Patent compressible  2. Mid upper arm 2.32mm Patent compressible  3. Above [facility] 2.35mm Patent compressible  4. In [facility] 3.72mm Partial thrombosis  5. Below AC 3.54mm Patent compressible  6. Mid forearm 2.70mm Patent compressible  7. Wrist 2.68mm Patent compressible   Basilic  Segment Diameter Depth Comment  2. Mid upper arm 5.29mm 19.19mm Patent compressible  3. Above [facility] 3.37mm 9.9mm Patent compressible  4. In [facility] 3.3mm 7.42mm Patent compressible  5. Below AC 1.57mm 4.68mm Patent compressible  6. Mid forearm 1.38mm mm Patent compressible   Left Upper Extremity Vein Map    Cephalic  Segment Diameter Comment  1. Axilla 3.61mm Patent compressible  2. Mid upper arm 2.1mm Patent compressible  3. Above [facility] 2.109mm Patent compressible  4. In [facility] 6.41mm Partial thrombosis  5. Below AC 3.15mm Patent compressible  6. Mid forearm mm Patent compressible  7. Wrist mm Mid forearm to wrist wrapped in guaze   Basilic  Segment Diameter Depth Comment  1. Axilla 5.35mm 16.55mm Patent compressible  2. Mid upper arm 4.19mm 22.55mm Patent compressible  3. Above [facility] 5.46mm 11.75mm Patent compressible  4. In [facility] 4.49mm 5.56mm Patent compressible  5. Below AC -mm -mm -  6. Mid forearm 3.15mm mm Patent compressible  7. Wrist -mm -mm -    CESTONE, HELENE, RVT 07/05/2013, 11:03 AM

## 2013-07-05 NOTE — Plan of Care (Signed)
Problem: Phase III Progression Outcomes Goal: Voiding independently Outcome: Progressing Noted pt voided after HD today in mod amt.

## 2013-07-05 NOTE — Progress Notes (Addendum)
[facility] PROGRESS NOTE  Jaclyn Morgan AST:[id number] DOB: 1932-05-22 DOA: 07/01/2013 PCP: Scarlette Calico, MD  Brief Narrative: HPI: Jaclyn Morgan is a 80 y.o. female with a history of Stage IV CKD, and HTN who was brought to the ED by her daughter due to increased confusion worsening over  2-3 days. Patient tripped in her home 1 week ago and suffered a contusion to the Left orbit. She was advised in March of 2015 that she needed Dialysis treatments, but she refused at that time. In the ED, she was found to have a BUN/Cr = 81 / 7.96 with hyperkalemia of 5.9. She also had a Pro BNP = 70 K, and a Troponin = 0.65. Since admission, seen by Cards and Nephrology. Placed HD catheter and started on HD this admission.  Assessment/Plan: 1. ESRD/volume overload/hyperkalemia, no distress  -s/p HD catheter placement 6/20, HD started 6/20 -BP much improved, cut down meds -needs permanent access, VVS consulted  -Per Renal  2. Accelerated Hypertension -much improved post HD -resumed clonidine, labetalol -BP still fluctuating -hydralazine PRN  3. Anemia of chronic disease  -drop in Hb, no overt blood loss -anemia panel c/w chronic disease -transfused 2 units PRBC on 6/21 -needs aranesp  4. Metabolic encephalopathy  -improving, suspect may have some baseline cognitive dysfunction too  5. NSTEMI/Demand ischemia  -No clinical symptoms of angina/EKG unremarkable -Echo normal EF 55-60% with no RWMA's  -per cards no need for further cardiac w/u   6. Fall/L orbital contusion   7. Hyperkalemia -resolved with kayexalate, HD  DVT proph: lovenox  Code Status: Full Code Family Communication: none at bedside Disposition Plan: [place] when outpt HD set up   Consultants:  Renal  Cardiology  HPI/Subjective: Feels ok, no complaints  Objective: Filed Vitals:   07/05/13 1400  BP: 200/140  Pulse: 63  Temp:   Resp:     Intake/Output Summary (Last 24 hours) at 07/05/13 1437 Last  data filed at 07/05/13 0900  Gross per 24 hour  Intake    360 ml  Output      0 ml  Net    360 ml   Filed Weights   07/04/13 1116 07/05/13 0459 07/05/13 1300  Weight: 72.9 kg (160 lb 11.5 oz) 73.029 kg (161 lb) 76.4 kg (168 lb 6.9 oz)    Exam:   General:  AAOx to self, place, much improved cognition  Cardiovascular: S1S2/RRR  Respiratory: CTAB  Abdomen: soft, Nt, BS present  Musculoskeletal: trace edema c/c   Data Reviewed: Basic Metabolic Panel:  Recent Labs Lab 07/01/13 2055 07/02/13 1950 07/02/13 2000 07/03/13 0500 07/04/13 0309 07/05/13 1318  NA 142  --  144 144 143 143  K 5.9*  --  4.2 3.5* 3.3* 3.4*  CL 103  --  105 104 103 103  CO2 16*  --  19 22 22 25   GLUCOSE 104*  --  140* 95 104* 101*  BUN 81*  --  83* 44* 45* 21  CREATININE 7.96*  --  8.12* 5.18* 5.89* 4.09*  CALCIUM 9.5 8.8 9.1 8.7 8.6 8.4  PHOS  --   --  5.5* 3.9 3.7 2.8   Liver Function Tests:  Recent Labs Lab 07/01/13 2055 07/02/13 2000 07/03/13 0500 07/04/13 0309 07/05/13 1318  AST 29  --   --   --   --   ALT 9  --   --   --   --   ALKPHOS 61  --   --   --   --  BILITOT 0.8  --   --   --   --   PROT 6.5  --   --   --   --   ALBUMIN 2.9* 2.5* 2.4* 2.7* 2.6*    Recent Labs Lab 07/01/13 2055  LIPASE 29   No results found for this basename: AMMONIA,  in the last 168 hours CBC:  Recent Labs Lab 07/02/13 0819 07/03/13 0500 07/04/13 0309 07/05/13 0409 07/05/13 1319  WBC 8.8 7.1 8.1 7.2 7.1  HGB 7.1* 5.6* 8.8* 8.8* 8.8*  HCT 22.7* 17.5* 27.0* 27.3* 27.4*  MCV 82.2 82.2 83.3 84.5 85.4  PLT 184 140* 145* 151 159   Cardiac Enzymes:  Recent Labs Lab 07/01/13 2055 07/02/13 0819 07/02/13 1327  TROPONINI 0.65* 0.40* 0.36*   BNP (last 3 results)  Recent Labs  07/01/13 2055  PROBNP >70000.0*   CBG:  Recent Labs Lab 07/01/13 2048  GLUCAP 97    Recent Results (from the past 240 hour(s))  CULTURE, BLOOD (ROUTINE X 2)     Status: None   Collection Time     07/01/13 11:41 PM      Result Value Ref Range Status   Specimen Description BLOOD RIGHT ANTECUBITAL   Final   Special Requests BOTTLES DRAWN AEROBIC AND ANAEROBIC 5CC   Final   Culture  Setup Time     Final   Value: 07/02/2013 04:07     Performed at [facility]   Culture     Final   Value: STAPHYLOCOCCUS SPECIES (COAGULASE NEGATIVE)     Note: THE SIGNIFICANCE OF ISOLATING THIS ORGANISM FROM A SINGLE SET OF BLOOD CULTURES WHEN MULTIPLE SETS ARE DRAWN IS UNCERTAIN. PLEASE NOTIFY THE MICROBIOLOGY DEPARTMENT WITHIN ONE WEEK IF SPECIATION AND SENSITIVITIES ARE REQUIRED.     Note: Gram Stain Report Called to,Read Back By and Verified With: PEGGY GERHARD 07/03/13 @ 5:59PM BY RUSCOE A.     Performed at [facility]   Report Status 07/05/2013 FINAL   Final  CULTURE, BLOOD (ROUTINE X 2)     Status: None   Collection Time    07/01/13 11:51 PM      Result Value Ref Range Status   Specimen Description BLOOD RIGHT HAND   Final   Special Requests BOTTLES DRAWN AEROBIC ONLY 5CC   Final   Culture  Setup Time     Final   Value: 07/02/2013 04:08     Performed at [facility]   Culture     Final   Value:        BLOOD CULTURE RECEIVED NO GROWTH TO DATE CULTURE WILL BE HELD FOR 5 DAYS BEFORE ISSUING A FINAL NEGATIVE REPORT     Performed at [facility]   Report Status PENDING   Incomplete  MRSA PCR SCREENING     Status: Abnormal   Collection Time    07/02/13  5:15 AM      Result Value Ref Range Status   MRSA by PCR POSITIVE (*) NEGATIVE Final   Comment:            The GeneXpert MRSA Assay (FDA     approved for NASAL specimens     only), is one component of a     comprehensive MRSA colonization     surveillance program. It is not     intended to diagnose MRSA     infection nor to guide or     monitor treatment for     MRSA  infections.     RESULT CALLED TO, READ BACK BY AND VERIFIED WITH:     MUHORO,D RN 07/02/13 [place]     Studies: No results  found.  Scheduled Meds: . aspirin  325 mg Oral Daily  . atorvastatin  40 mg Oral q1800  . Chlorhexidine Gluconate Cloth  6 each Topical Q0600  . cloNIDine  0.1 mg Oral BID  . darbepoetin (ARANESP) injection - DIALYSIS  100 mcg Intravenous Q Sat-HD  . doxazosin  4 mg Oral BID  . doxercalciferol  1 mcg Intravenous Q T,Th,Sa-HD  . enoxaparin (LOVENOX) injection  30 mg Subcutaneous Q24H  . labetalol  200 mg Oral BID  . multivitamin with minerals  1 tablet Oral Daily  . mupirocin ointment  1 application Nasal BID   Continuous Infusions:  Antibiotics Given (last 72 hours)   None      Principal Problem:   Uremia Active Problems:   Unspecified hypothyroidism   Renal failure   Metabolic encephalopathy   CKD (chronic kidney disease), stage IV   Hypertensive urgency   CAP (community acquired pneumonia)   Acute diastolic CHF (congestive heart failure)   Anemia of renal disease   Hyperkalemia   Elevated troponin    Time spent: 38min    JOSEPH,PREETHA  [facility] Pager [phone]. If 7PM-7AM, please contact night-coverage at [email or web address], password [facility] 07/05/2013, 2:37 PM  LOS: 4 days

## 2013-07-05 NOTE — Procedures (Signed)
Patient was seen on dialysis and the procedure was supervised.  BFR 400  Via PC BP is  172/84.   Patient appears to be tolerating treatment well- I think they held her BP meds pre HD  Romulus Hanrahan A 07/05/2013

## 2013-07-05 NOTE — Plan of Care (Signed)
Problem: Phase III Progression Outcomes Goal: Voiding independently Outcome: Not Met (add Reason) Pt oliguric

## 2013-07-05 NOTE — Progress Notes (Signed)
BACKGROUND 80 y.o. year-old female with known advanced CKD5 d/t hypertensive nephrosclerosis, followed by Dr. Justin Mend.  On review office records was non-compliant with appts, cancelled vascular acces surgery with Dr. Oneida Alar in Dec 2014 and refused to start HD when advised in March 2015 by Dr. Justin Mend.  Was brought to the ED by her family because of uremic symptoms. She was found to have a creatinine on 7.96, BUN 81, CO2 of 16, elevated troponin of 0.65, Hb 7.8, CXR with mild vascular congestion. CT brain (done b/o recent fall/confusion) no acute change and facial CT just showed left periorbital contusion. TDC was placed by IR on 07/02/13 and HD was initiated for ESRD  ASSESSMENT/RECOMMENDATIONS  1. ESRD - Pt/family agreed to catheter placement and HD was initiated 6/20.  2nd treatment 6/22, 3rd today then anticipate TTS schedule. CLIP pending. Save right arm (left handed). Consult VVS  for AVF- appreciate their assist.  2. Anemia - Initiated darbepoetin with HD 100 QSat. Iron stores adequate. S/p transfusion Sunday- hgb 8.8 3. Secondary hyperpara - on po calcitriol at home -  changed to hectorol with HD. PTH 259. Phos/calc at goal no binders 4. Hypertension with hypertensive urgency - d/t failure to take BP meds at home.  improved. Clonidine/cardura/labetalol- was up today 5. Confusion/AMS - at least in part uremic. CT after recent fall no stroke or bleed.  improved overall 6. NSTEMI - presume demand ischemia. Cards is following. Heparin stopped. Advised BB and ASA. Enzymes elev but flat. Echo 55-60% No arrythmia. No curther w/u advised by Dr. Johnsie Cancel. 7. HLD - statin 8. Orbital contusion post fall  Subjective:  Less confused, family is pleased that she is better  Edema less   Objective Vital signs in last 24 hours: Filed Vitals:   07/04/13 2049 07/04/13 2335 07/05/13 0459 07/05/13 1050  BP: 141/82 150/80 152/84 224/93  Pulse: 79 76 71   Temp: 97.8 F (36.6 C)  98 F (36.7 C)   TempSrc: Oral   Oral   Resp: 18  18   Height:      Weight:   73.029 kg (161 lb)   SpO2: 97%  96%    Weight change: -2.2 kg (-4 lb 13.6 oz)  Intake/Output Summary (Last 24 hours) at 07/05/13 1051 Last data filed at 07/05/13 0900  Gross per 24 hour  Intake    360 ml  Output   1000 ml  Net   -640 ml   Physical Exam:  Blood pressure 135/67, pulse 65, temperature 97.6 F (36.4 C), temperature source Oral, resp. rate 18, height 5' (1.524 m), weight 76.6 kg (168 lb 14 oz), SpO2 100.00%. Alert, but confused TDC right chest dry dressing Lungs with decreased BS No rales S1S2 No S3 No rub Abd obese, soft, not tender Ext some edema and some wrinkling  Recent Labs Lab 07/01/13 2055 07/02/13 1950 07/02/13 2000 07/03/13 0500 07/04/13 0309  NA 142  --  144 144 143  K 5.9*  --  4.2 3.5* 3.3*  CL 103  --  105 104 103  CO2 16*  --  19 22 22   GLUCOSE 104*  --  140* 95 104*  BUN 81*  --  83* 44* 45*  CREATININE 7.96*  --  8.12* 5.18* 5.89*  CALCIUM 9.5 8.8 9.1 8.7 8.6  PHOS  --   --  5.5* 3.9 3.7    Recent Labs Lab 07/01/13 2055 07/02/13 2000 07/03/13 0500 07/04/13 0309  AST 29  --   --   --  ALT 9  --   --   --   ALKPHOS 61  --   --   --   BILITOT 0.8  --   --   --   PROT 6.5  --   --   --   ALBUMIN 2.9* 2.5* 2.4* 2.7*    Recent Labs Lab 07/01/13 2055  LIPASE 29    Recent Labs Lab 07/02/13 0819 07/03/13 0500 07/04/13 0309 07/05/13 0409  WBC 8.8 7.1 8.1 7.2  HGB 7.1* 5.6* 8.8* 8.8*  HCT 22.7* 17.5* 27.0* 27.3*  MCV 82.2 82.2 83.3 84.5  PLT 184 140* 145* 151    Recent Labs Lab 07/01/13 2055 07/02/13 0819 07/02/13 1327  TROPONINI 0.65* 0.40* 0.36*    Recent Labs Lab 07/01/13 2048  GLUCAP 97       Recent Labs Lab 07/02/13 0819 07/02/13 1950  IRON 49 62  TIBC 157* 145*  FERRITIN 710*  --    Studies/Results: No results found. Medications:   . aspirin  325 mg Oral Daily  . atorvastatin  40 mg Oral q1800  . Chlorhexidine Gluconate Cloth  6 each Topical  Q0600  . cloNIDine  0.1 mg Oral BID  . darbepoetin (ARANESP) injection - DIALYSIS  100 mcg Intravenous Q Sat-HD  . doxazosin  4 mg Oral BID  . doxercalciferol  1 mcg Intravenous Q T,Th,Sa-HD  . enoxaparin (LOVENOX) injection  30 mg Subcutaneous Q24H  . labetalol  200 mg Oral BID  . multivitamin with minerals  1 tablet Oral Daily  . mupirocin ointment  1 application Nasal BID   GOLDSBOROUGH,KELLIE A   07/05/2013, 10:51 AM

## 2013-07-05 NOTE — Progress Notes (Signed)
PT Cancellation Note  Patient Details Name: TEAL RABEN MRN: [id number] DOB: [birth year]   Cancelled Treatment:    Reason Eval/Treat Not Completed: Patient at procedure or test/unavailable. Per nursing, pt at HD and is scheduled to return to the floor at 1730. Will continue to follow and attempt again tomorrow.    Jolyn Lent 07/05/2013, 4:01 PM  Jolyn Lent, PT, DPT Acute Rehabilitation Services Pager: [phone]

## 2013-07-06 DIAGNOSIS — N184 Chronic kidney disease, stage 4 (severe): Secondary | ICD-10-CM

## 2013-07-06 LAB — RENAL FUNCTION PANEL
Albumin: 2.3 g/dL — ABNORMAL LOW (ref 3.5–5.2)
BUN: 9 mg/dL (ref 6–23)
CALCIUM: 8.2 mg/dL — AB (ref 8.4–10.5)
CO2: 27 meq/L (ref 19–32)
CREATININE: 2.67 mg/dL — AB (ref 0.50–1.10)
Chloride: 104 mEq/L (ref 96–112)
GFR calc Af Amer: 19 mL/min — ABNORMAL LOW (ref >90)
GFR, EST NON AFRICAN AMERICAN: 16 mL/min — AB (ref >90)
Glucose, Bld: 106 mg/dL — ABNORMAL HIGH (ref 70–99)
Phosphorus: 1.4 mg/dL — ABNORMAL LOW (ref 2.3–4.6)
Potassium: 3 mEq/L — ABNORMAL LOW (ref 3.7–5.3)
Sodium: 143 mEq/L (ref 137–147)

## 2013-07-06 LAB — CBC
HCT: 28.9 % — ABNORMAL LOW (ref 36.0–46.0)
Hemoglobin: 9.1 g/dL — ABNORMAL LOW (ref 12.0–15.0)
MCH: 27.7 pg (ref 26.0–34.0)
MCHC: 31.5 g/dL (ref 30.0–36.0)
MCV: 88.1 fL (ref 78.0–100.0)
PLATELETS: 150 10*3/uL (ref 150–400)
RBC: 3.28 MIL/uL — AB (ref 3.87–5.11)
RDW: 14.1 % (ref 11.5–15.5)
WBC: 6.5 10*3/uL (ref 4.0–10.5)

## 2013-07-06 MED ORDER — DEXTROSE 5 % IV SOLN
20.0000 mmol | Freq: Once | INTRAVENOUS | Status: AC
  Administered 2013-07-06: 20 mmol via INTRAVENOUS
  Filled 2013-07-06: qty 6.67

## 2013-07-06 MED ORDER — DEXTROSE 5 % IV SOLN
1.5000 g | INTRAVENOUS | Status: AC
  Filled 2013-07-06: qty 1.5

## 2013-07-06 NOTE — Progress Notes (Signed)
07/06/2013 8:02 AM Hemodialysis Outpatient Note; I understand this patient will be going to a SNF upon discharge-we cannot place this patient for outpatient dialysis until we know where she will be going. Please advise when a place has been chosen. Thank you. Gordy Savers

## 2013-07-06 NOTE — Progress Notes (Signed)
[facility] PROGRESS NOTE  80 y.o. female with a history of Stage IV CKD, and HTN who was brought to the ED by her daughter due to increased confusion worsening over 2-3 days. Patient tripped in her home 1 week ago and suffered a contusion to the Left orbit.  She was advised in March of 2015 that she needed Dialysis treatments, but she refused at that time. In the ED, she was found to have a BUN/Cr = 81 / 7.96 with hyperkalemia of 5.9. She also had a Pro BNP = 70 K, and a Troponin = 0.65.  Since admission, seen by Cards and Nephrology.  Placed HD catheter and started on HD this admission  Assessment/Plan: ESRD/volume overload/hyperkalemia:no distress  - S/p HD catheter placement 6/20, HD started 6/20  - BP much improved, cont to titrate meds. - VVS consulted, possible intervention on 6.24.2015.Pt is NPO. - Cont HD Per Renal,   Accelerated Hypertension: - much improved post HD with fluctuations. - Cont. clonidine, labetalol  - Hydralazine PRN .  Anemia of chronic disease: - Drop in Hb, no overt blood loss  - Anemia panel c/w chronic disease  - Transfused 2 units PRBC on 6/21  - Aranesp during HD.  Uremic encephalopathy: - improving, suspect may have some baseline cognitive dysfunction too.  NSTEMI/Demand ischemia: - No clinical symptoms of angina/EKG unremarkable  - Echo normal EF 55-60% with no RWMA's  - Per cards no need for further cardiac w/u   Fall/L orbital contusion:  Hyperkalemia  - resolved with kayexalate, HD   DVT proph: lovenox      Code Status: full Family Communication: none  Disposition Plan: inpatinet   Consultants:  Renal  VVS  Cardiology  Procedures:  Vein mapping  Antibiotics:  None  HPI/Subjective: No complains  Objective: Filed Vitals:   07/05/13 1727 07/05/13 1859 07/05/13 2045 07/06/13 0618  BP: 191/90 155/68 177/72 164/69  Pulse: 69 70 85 62  Temp: 98 F (36.7 C)   97.8 F (36.6 C)  TempSrc: Oral   Oral    Resp: 16  17 18   Height:      Weight:    73.936 kg (163 lb)  SpO2: 97%  100% 98%    Intake/Output Summary (Last 24 hours) at 07/06/13 0753 Last data filed at 07/05/13 1716  Gross per 24 hour  Intake    480 ml  Output   2501 ml  Net  -2021 ml   Filed Weights   07/05/13 1300 07/05/13 1642 07/06/13 0618  Weight: 76.4 kg (168 lb 6.9 oz) 73.9 kg (162 lb 14.7 oz) 73.936 kg (163 lb)    Exam:  General: Alert, awake, oriented x3. HEENT: No bruits, no goiter.  Heart: Regular rate and rhythm, without murmurs, rubs, gallops.  Lungs: Good air movement, clear Abdomen: Soft, nontender, nondistended, positive bowel sounds.    Data Reviewed: Basic Metabolic Panel:  Recent Labs Lab 07/02/13 2000 07/03/13 0500 07/04/13 0309 07/05/13 1318 07/06/13 0350  NA 144 144 143 143 143  K 4.2 3.5* 3.3* 3.4* 3.0*  CL 105 104 103 103 104  CO2 19 22 22 25 27   GLUCOSE 140* 95 104* 101* 106*  BUN 83* 44* 45* 21 9  CREATININE 8.12* 5.18* 5.89* 4.09* 2.67*  CALCIUM 9.1 8.7 8.6 8.4 8.2*  PHOS 5.5* 3.9 3.7 2.8 1.4*   Liver Function Tests:  Recent Labs Lab 07/01/13 2055 07/02/13 2000 07/03/13 0500 07/04/13 0309 07/05/13 1318 07/06/13 0350  AST 29  --   --   --   --   --  ALT 9  --   --   --   --   --   ALKPHOS 61  --   --   --   --   --   BILITOT 0.8  --   --   --   --   --   PROT 6.5  --   --   --   --   --   ALBUMIN 2.9* 2.5* 2.4* 2.7* 2.6* 2.3*    Recent Labs Lab 07/01/13 2055  LIPASE 29   No results found for this basename: AMMONIA,  in the last 168 hours CBC:  Recent Labs Lab 07/03/13 0500 07/04/13 0309 07/05/13 0409 07/05/13 1319 07/06/13 0350  WBC 7.1 8.1 7.2 7.1 6.5  HGB 5.6* 8.8* 8.8* 8.8* 9.1*  HCT 17.5* 27.0* 27.3* 27.4* 28.9*  MCV 82.2 83.3 84.5 85.4 88.1  PLT 140* 145* 151 159 150   Cardiac Enzymes:  Recent Labs Lab 07/01/13 2055 07/02/13 0819 07/02/13 1327  TROPONINI 0.65* 0.40* 0.36*   BNP (last 3 results)  Recent Labs  07/01/13 2055   PROBNP >70000.0*   CBG:  Recent Labs Lab 07/01/13 2048  GLUCAP 97    Recent Results (from the past 240 hour(s))  CULTURE, BLOOD (ROUTINE X 2)     Status: None   Collection Time    07/01/13 11:41 PM      Result Value Ref Range Status   Specimen Description BLOOD RIGHT ANTECUBITAL   Final   Special Requests BOTTLES DRAWN AEROBIC AND ANAEROBIC 5CC   Final   Culture  Setup Time     Final   Value: 07/02/2013 04:07     Performed at [facility]   Culture     Final   Value: STAPHYLOCOCCUS SPECIES (COAGULASE NEGATIVE)     Note: THE SIGNIFICANCE OF ISOLATING THIS ORGANISM FROM A SINGLE SET OF BLOOD CULTURES WHEN MULTIPLE SETS ARE DRAWN IS UNCERTAIN. PLEASE NOTIFY THE MICROBIOLOGY DEPARTMENT WITHIN ONE WEEK IF SPECIATION AND SENSITIVITIES ARE REQUIRED.     Note: Gram Stain Report Called to,Read Back By and Verified With: PEGGY GERHARD 07/03/13 @ 5:59PM BY RUSCOE A.     Performed at [facility]   Report Status 07/05/2013 FINAL   Final  CULTURE, BLOOD (ROUTINE X 2)     Status: None   Collection Time    07/01/13 11:51 PM      Result Value Ref Range Status   Specimen Description BLOOD RIGHT HAND   Final   Special Requests BOTTLES DRAWN AEROBIC ONLY 5CC   Final   Culture  Setup Time     Final   Value: 07/02/2013 04:08     Performed at [facility]   Culture     Final   Value:        BLOOD CULTURE RECEIVED NO GROWTH TO DATE CULTURE WILL BE HELD FOR 5 DAYS BEFORE ISSUING A FINAL NEGATIVE REPORT     Performed at [facility]   Report Status PENDING   Incomplete  MRSA PCR SCREENING     Status: Abnormal   Collection Time    07/02/13  5:15 AM      Result Value Ref Range Status   MRSA by PCR POSITIVE (*) NEGATIVE Final   Comment:            The GeneXpert MRSA Assay (FDA     approved for NASAL specimens     only), is one component of a  comprehensive MRSA colonization     surveillance program. It is not     intended to diagnose MRSA     infection nor  to guide or     monitor treatment for     MRSA infections.     RESULT CALLED TO, READ BACK BY AND VERIFIED WITH:     MUHORO,D RN 07/02/13 [place]     Studies: No results found.  Scheduled Meds: . aspirin  325 mg Oral Daily  . atorvastatin  40 mg Oral q1800  . cloNIDine  0.1 mg Oral BID  . darbepoetin (ARANESP) injection - DIALYSIS  100 mcg Intravenous Q Sat-HD  . doxazosin  4 mg Oral BID  . doxercalciferol  1 mcg Intravenous Q T,Th,Sa-HD  . enoxaparin (LOVENOX) injection  30 mg Subcutaneous Q24H  . labetalol  200 mg Oral BID  . multivitamin with minerals  1 tablet Oral Daily  . mupirocin ointment  1 application Nasal BID   Continuous Infusions:    Charlynne Cousins  [facility] Pager [phone]. If 8PM-8AM, please contact night-coverage at [email or web address], password [facility] 07/06/2013, 7:53 AM  LOS: 5 days      **Disclaimer: This note may have been dictated with voice recognition software. Similar sounding words can inadvertently be transcribed and this note may contain transcription errors which may not have been corrected upon publication of note.**

## 2013-07-06 NOTE — Progress Notes (Signed)
BACKGROUND 80 y.o. year-old female with known advanced CKD5 d/t hypertensive nephrosclerosis, followed by Dr. Justin Mend.  On review office records was non-compliant with appts, cancelled vascular acces surgery with Dr. Oneida Alar in Dec 2014 and refused to start HD when advised in March 2015 by Dr. Justin Mend.  Was brought to the ED by her family because of uremic symptoms. She was found to have a creatinine on 7.96, BUN 81, CO2 of 16, elevated troponin of 0.65, Hb 7.8, CXR with mild vascular congestion. CT brain (done b/o recent fall/confusion) no acute change  TDC was placed by IR on 07/02/13 and HD was initiated for ESRD  ASSESSMENT/RECOMMENDATIONS  1. ESRD - Pt/family agreed  and HD was initiated 6/20.  2nd  6/22, 3rd 6/24 now anticipate TTS schedule. CLIP pending. Save right arm (left handed). Consult VVS  for AVF- appreciate their assist- possible permanent access today ?  2. Anemia - Initiated darbepoetin with HD 100 QSat. Iron stores adequate. S/p transfusion Sunday- hgb 9.1 3. Secondary hyperpara - on po calcitriol at home -  changed to hectorol with HD. PTH 259. Phos/calc at goal no binders- phos actually low , will replace today 4. Hypertension with hypertensive urgency - d/t failure to take BP meds at home.  improved. Clonidine/cardura/labetalol plus dialysis- will challenge EDW hopefully be able to wean meds at some point 5. Confusion/AMS - at least in part uremic. CT after recent fall no stroke or bleed.  improved overall 6. NSTEMI - presume demand ischemia. Cards is following. Heparin stopped. Advised BB and ASA. Enzymes elev but flat. Echo 55-60% No arrythmia. No curther w/u advised by Dr. Johnsie Cancel. 7. HLD - statin 8. Orbital contusion post fall 9. Dispo- as I understand may need SNF   Subjective:  Less confused, family is pleased that she is better  Edema less   Objective Vital signs in last 24 hours: Filed Vitals:   07/05/13 1727 07/05/13 1859 07/05/13 2045 07/06/13 0618  BP: 191/90 155/68  177/72 164/69  Pulse: 69 70 85 62  Temp: 98 F (36.7 C)   97.8 F (36.6 C)  TempSrc: Oral   Oral  Resp: 16  17 18   Height:      Weight:    73.936 kg (163 lb)  SpO2: 97%  100% 98%   Weight change: 2.5 kg (5 lb 8.2 oz)  Intake/Output Summary (Last 24 hours) at 07/06/13 1019 Last data filed at 07/05/13 1716  Gross per 24 hour  Intake    360 ml  Output   2501 ml  Net  -2141 ml   Physical Exam:   Alert, but confused TDC right chest dry dressing Lungs with decreased BS No rales S1S2 No S3 No rub Abd obese, soft, not tender Ext some edema   Recent Labs Lab 07/01/13 2055 07/02/13 1950 07/02/13 2000 07/03/13 0500 07/04/13 0309 07/05/13 1318 07/06/13 0350  NA 142  --  144 144 143 143 143  K 5.9*  --  4.2 3.5* 3.3* 3.4* 3.0*  CL 103  --  105 104 103 103 104  CO2 16*  --  19 22 22 25 27   GLUCOSE 104*  --  140* 95 104* 101* 106*  BUN 81*  --  83* 44* 45* 21 9  CREATININE 7.96*  --  8.12* 5.18* 5.89* 4.09* 2.67*  CALCIUM 9.5 8.8 9.1 8.7 8.6 8.4 8.2*  PHOS  --   --  5.5* 3.9 3.7 2.8 1.4*    Recent Labs Lab 07/01/13 2055  07/04/13 0309 07/05/13 1318 07/06/13 0350  AST 29  --   --   --   --   ALT 9  --   --   --   --   ALKPHOS 61  --   --   --   --   BILITOT 0.8  --   --   --   --   PROT 6.5  --   --   --   --   ALBUMIN 2.9*  < > 2.7* 2.6* 2.3*  < > = values in this interval not displayed.  Recent Labs Lab 07/01/13 2055  LIPASE 29    Recent Labs Lab 07/04/13 0309 07/05/13 0409 07/05/13 1319 07/06/13 0350  WBC 8.1 7.2 7.1 6.5  HGB 8.8* 8.8* 8.8* 9.1*  HCT 27.0* 27.3* 27.4* 28.9*  MCV 83.3 84.5 85.4 88.1  PLT 145* 151 159 150    Recent Labs Lab 07/01/13 2055 07/02/13 0819 07/02/13 1327  TROPONINI 0.65* 0.40* 0.36*    Recent Labs Lab 07/01/13 2048  GLUCAP 97       Recent Labs Lab 07/02/13 0819 07/02/13 1950  IRON 49 62  TIBC 157* 145*  FERRITIN 710*  --    Studies/Results: No results found. Medications:   . aspirin  325 mg Oral  Daily  . atorvastatin  40 mg Oral q1800  . cloNIDine  0.1 mg Oral BID  . darbepoetin (ARANESP) injection - DIALYSIS  100 mcg Intravenous Q Sat-HD  . doxazosin  4 mg Oral BID  . doxercalciferol  1 mcg Intravenous Q T,Th,Sa-HD  . enoxaparin (LOVENOX) injection  30 mg Subcutaneous Q24H  . labetalol  200 mg Oral BID  . multivitamin with minerals  1 tablet Oral Daily  . mupirocin ointment  1 application Nasal BID   GOLDSBOROUGH,KELLIE A   07/06/2013, 10:19 AM

## 2013-07-06 NOTE — Progress Notes (Signed)
Physical Therapy Treatment Patient Details Name: Jaclyn Morgan MRN: [id number] DOB: 26-May-1932 Today's Date: 07/06/2013    History of Present Illness Patient is a 80 yo female admitted 07/01/13 with uremia, renal failure, metabolic encephalopahy, NSTEMI, HTN.  Patient now on HD.  PMH: CKD, HTN, CHF, mult falls    PT Comments    Pt progressing towards physical therapy goals. Was able to improve ambulation distance this session however significant drifting to the left during gait training. Set patient up with sandwich tray at end of session. Will continue to follow.   Follow Up Recommendations  SNF;Supervision/Assistance - 24 hour     Equipment Recommendations  Wheelchair (measurements PT)    Recommendations for Other Services       Precautions / Restrictions Precautions Precautions: Fall Restrictions Weight Bearing Restrictions: No    Mobility  Bed Mobility Overal bed mobility: Needs Assistance Bed Mobility: Supine to Sit     Supine to sit: Mod assist     General bed mobility comments: Pt able to sit EOB with assist to scoot all the way out so feet were resting on floor. Frequent cueing required to complete task.  Transfers Overall transfer level: Needs assistance Equipment used: Rolling walker (2 wheeled) Transfers: Sit to/from Stand Sit to Stand: Min assist;+2 physical assistance         General transfer comment: VC's for hand placement on seated surface for safety. Able to power-up to full standing with +2 min assist. Small LOB back onto the bed after first transfer. Was able to maintain standing after.   Ambulation/Gait Ambulation/Gait assistance: Min assist Ambulation Distance (Feet): 60 Feet Assistive device: Rolling walker (2 wheeled) Gait Pattern/deviations: Step-through pattern;Decreased stride length (drifting to the left) Gait velocity: Decreased Gait velocity interpretation: Below normal speed for age/gender General Gait Details: VC's for walker  position and direction. Pt drifting to the left consistently throughout gait training.    Stairs            Wheelchair Mobility    Modified Rankin (Stroke Patients Only)       Balance Overall balance assessment: Needs assistance Sitting-balance support: Feet supported;No upper extremity supported Sitting balance-Leahy Scale: Fair     Standing balance support: Bilateral upper extremity supported Standing balance-Leahy Scale: Poor                      Cognition Arousal/Alertness: Awake/alert Behavior During Therapy: WFL for tasks assessed/performed Overall Cognitive Status: Impaired/Different from baseline                 General Comments: Pleasantly confused. Repeating questions throughout session.     Exercises      General Comments        Pertinent Vitals/Pain Vitals stable throughout session.     Home Living                      Prior Function            PT Goals (current goals can now be found in the care plan section) Acute Rehab PT Goals Patient Stated Goal: None stated PT Goal Formulation: With patient/family Time For Goal Achievement: 07/17/13 Potential to Achieve Goals: Fair Progress towards PT goals: Progressing toward goals    Frequency  Min 3X/week    PT Plan Current plan remains appropriate    Co-evaluation             End of Session Equipment Utilized During Treatment: Gait belt  Activity Tolerance: Patient limited by fatigue Patient left: in chair;with chair alarm set;with call bell/phone within reach     Time: 1412-1435 PT Time Calculation (min): 23 min  Charges:  $Gait Training: 8-22 mins $Therapeutic Activity: 8-22 mins                    G Codes:      Jolyn Lent 07-08-2013, 3:47 PM  Jolyn Lent, PT, DPT Acute Rehabilitation Services Pager: [phone]

## 2013-07-06 NOTE — Progress Notes (Signed)
Subjective: Interval History: none..   Objective: Vital signs in last 24 hours: Temp:  [97.3 F (36.3 C)-98.9 F (37.2 C)] 97.8 F (36.6 C) (06/24 1403) Pulse Rate:  [59-103] 59 (06/24 1403) Resp:  [16-20] 20 (06/24 1403) BP: (155-201)/(68-90) 169/84 mmHg (06/24 1403) SpO2:  [97 %-100 %] 100 % (06/24 1403) Weight:  [162 lb 14.7 oz (73.9 kg)-163 lb (73.936 kg)] 163 lb (73.936 kg) (06/24 0618)  Intake/Output from previous day: 06/23 0701 - 06/24 0700 In: 480 [P.O.:480] Out: 2501 [Stool:1] Intake/Output this shift:    2+ radial pulses bilaterally. Poor surface vein size  Lab Results:  Recent Labs  07/05/13 1319 07/06/13 0350  WBC 7.1 6.5  HGB 8.8* 9.1*  HCT 27.4* 28.9*  PLT 159 150   BMET  Recent Labs  07/05/13 1318 07/06/13 0350  NA 143 143  K 3.4* 3.0*  CL 103 104  CO2 25 27  GLUCOSE 101* 106*  BUN 21 9  CREATININE 4.09* 2.67*  CALCIUM 8.4 8.2*    Studies/Results: Dg Chest 2 View  07/01/2013   CLINICAL DATA:  Fall  EXAM: CHEST  2 VIEW  COMPARISON:  Radiograph 12/28/2012  FINDINGS: Cardiac silhouette is mildly enlarged. There is a new right pleural effusion. Concern for right lower lobe airspace disease additionally.  IMPRESSION: 1. New right pleural effusion. 2. Right lobe atelectasis versus infiltrate.   Electronically Signed   By: Suzy Bouchard M.D.   On: 07/01/2013 22:40   Ct Head Wo Contrast  07/01/2013   CLINICAL DATA:  80 year old female with altered mental status following fall. Hypertension.  EXAM: CT HEAD WITHOUT CONTRAST  TECHNIQUE: Contiguous axial images were obtained from the base of the skull through the vertex without intravenous contrast.  COMPARISON:  None.  FINDINGS: Moderate chronic small-vessel white matter ischemic changes are noted.  No acute intracranial abnormalities are identified, including mass lesion or mass effect, hydrocephalus, extra-axial fluid collection, midline shift, hemorrhage, or acute infarction.  The visualized bony  calvarium is unremarkable.  Mild left forehead soft tissue swelling is.  IMPRESSION: No evidence of acute intracranial abnormality.  Mild left forehead soft tissue swelling without fracture.  Moderate chronic small-vessel white matter ischemic changes.   Electronically Signed   By: Hassan Rowan M.D.   On: 07/01/2013 21:50   Ir Fluoro Guide Cv Line Right  07/02/2013   INDICATION: 80 year old female with acute on chronic renal failure in need of urgent hemodialysis secondary to uremia. Placement of a tunneled HD catheter is warranted to facilitate urgent hemodialysis.  EXAM: TUNNELED CENTRAL VENOUS HEMODIALYSIS CATHETER PLACEMENT WITH ULTRASOUND AND FLUOROSCOPIC GUIDANCE  MEDICATIONS: 1 g vancomycin The IV antibiotic was given in an appropriate time interval prior to skin puncture.  CONTRAST:  None  ANESTHESIA/SEDATION: Versed 2 mg IV; Fentanyl 50 mcg IV  Total Moderate Sedation Time  Fifteen minutes.  FLUOROSCOPY TIME:  18 seconds  COMPLICATIONS: None immediate  PROCEDURE: Informed written consent was obtained from the patient's daughter after a discussion of the risks, benefits, and alternatives to treatment. Questions regarding the procedure were encouraged and answered. The right neck and chest were prepped with chlorhexidine in a sterile fashion, and a sterile drape was applied covering the operative field. Maximum barrier sterile technique with sterile gowns and gloves were used for the procedure. A timeout was performed prior to the initiation of the procedure.  After creating a small venotomy incision, a micropuncture kit was utilized to access the right internal jugular vein under direct, real-time ultrasound guidance  after the overlying soft tissues were anesthetized with 1% lidocaine with epinephrine. Ultrasound image documentation was performed. The microwire was kinked to measure appropriate catheter length. A stiff glidewire was advanced to the level of the IVC and the micropuncture sheath was exchanged  for a peel-away sheath. A Bard hemo split tunneled hemodialysis catheter measuring 19 cm from tip to cuff was tunneled in a retrograde fashion from the anterior chest wall to the venotomy incision.  The catheter was then placed through the peel-away sheath with tips ultimately positioned within the superior aspect of the right atrium. Final catheter positioning was confirmed and documented with a spot radiographic image. The catheter aspirates and flushes normally. The catheter was flushed with appropriate volume heparin dwells.  The catheter exit site was secured with a 0-Prolene retention suture. The venotomy incision was closed with an interrupted 4-0 Vicryl, Dermabond and Steri-strips. Dressings were applied. The patient tolerated the procedure well without immediate post procedural complication.  IMPRESSION: Successful placement of 19 cm tip to cuff tunneled hemodialysis catheter via the right internal jugular vein with tips terminating within the superior aspect of the right atrium. The catheter is ready for immediate use.  Signed,  Criselda Peaches, MD  [facility]  [facility]   Electronically Signed   By: Jacqulynn Cadet M.D.   On: 07/02/2013 12:02   Ir US Guide Vasc Access Right  07/02/2013   INDICATION: 80 year old female with acute on chronic renal failure in need of urgent hemodialysis secondary to uremia. Placement of a tunneled HD catheter is warranted to facilitate urgent hemodialysis.  EXAM: TUNNELED CENTRAL VENOUS HEMODIALYSIS CATHETER PLACEMENT WITH ULTRASOUND AND FLUOROSCOPIC GUIDANCE  MEDICATIONS: 1 g vancomycin The IV antibiotic was given in an appropriate time interval prior to skin puncture.  CONTRAST:  None  ANESTHESIA/SEDATION: Versed 2 mg IV; Fentanyl 50 mcg IV  Total Moderate Sedation Time  Fifteen minutes.  FLUOROSCOPY TIME:  18 seconds  COMPLICATIONS: None immediate  PROCEDURE: Informed written consent was obtained from the  patient's daughter after a discussion of the risks, benefits, and alternatives to treatment. Questions regarding the procedure were encouraged and answered. The right neck and chest were prepped with chlorhexidine in a sterile fashion, and a sterile drape was applied covering the operative field. Maximum barrier sterile technique with sterile gowns and gloves were used for the procedure. A timeout was performed prior to the initiation of the procedure.  After creating a small venotomy incision, a micropuncture kit was utilized to access the right internal jugular vein under direct, real-time ultrasound guidance after the overlying soft tissues were anesthetized with 1% lidocaine with epinephrine. Ultrasound image documentation was performed. The microwire was kinked to measure appropriate catheter length. A stiff glidewire was advanced to the level of the IVC and the micropuncture sheath was exchanged for a peel-away sheath. A Bard hemo split tunneled hemodialysis catheter measuring 19 cm from tip to cuff was tunneled in a retrograde fashion from the anterior chest wall to the venotomy incision.  The catheter was then placed through the peel-away sheath with tips ultimately positioned within the superior aspect of the right atrium. Final catheter positioning was confirmed and documented with a spot radiographic image. The catheter aspirates and flushes normally. The catheter was flushed with appropriate volume heparin dwells.  The catheter exit site was secured with a 0-Prolene retention suture. The venotomy incision was closed with an interrupted 4-0 Vicryl, Dermabond and Steri-strips. Dressings were applied. The patient tolerated the procedure  well without immediate post procedural complication.  IMPRESSION: Successful placement of 19 cm tip to cuff tunneled hemodialysis catheter via the right internal jugular vein with tips terminating within the superior aspect of the right atrium. The catheter is ready for  immediate use.  Signed,  Criselda Peaches, MD  [facility]  [facility]   Electronically Signed   By: Jacqulynn Cadet M.D.   On: 07/02/2013 12:02   Dg Chest Portable 1 View  07/02/2013   CLINICAL DATA:  Left central line placement.  EXAM: PORTABLE CHEST - 1 VIEW  COMPARISON:  Chest radiograph from 07/01/2013  FINDINGS: A left IJ line is seen ending about the distal SVC.  The small right-sided pleural effusion has improved. Mild residual right basilar airspace opacity is seen. Mild vascular congestion is noted. No pneumothorax is identified.  The cardiomediastinal silhouette is borderline enlarged. No acute osseous abnormalities are seen.  IMPRESSION: 1. Left IJ line seen ending about the distal SVC. 2. Small right-sided pleural effusion has improved. Mild residual right basilar airspace opacity may reflect atelectasis or pneumonia. Mild vascular congestion and borderline cardiomegaly.   Electronically Signed   By: Garald Balding M.D.   On: 07/02/2013 05:03   Ct Maxillofacial Wo Cm  07/01/2013   CLINICAL DATA:  Fall, left black eye.  EXAM: CT MAXILLOFACIAL WITHOUT CONTRAST  TECHNIQUE: Multidetector CT imaging of the maxillofacial structures was performed. Multiplanar CT image reconstructions were also generated. A small metallic BB was placed on the right temple in order to reliably differentiate right from left.  COMPARISON:  None.  FINDINGS: Left periorbital/supra or soft tissue swelling with contusion is present. Hyperdensity within the left anterior chamber itself may represent blood. The globes are intact without evidence of acute injury. No retro-orbital hematoma or other pathology. Intraconal and extraconal fat are well well preserved.  The bony orbits are intact without evidence of orbital floor fracture. Zygomatic arches are intact.  Maxilla is intact. No nasal bone fracture. Mandible is intact. Mandibular condyles are normally positioned within the  temporomandibular fossa.  Paranasal sinuses are well pneumatized and free of fluid. No mastoid effusion.  A advanced atrophy noted within the partially visualized right  IMPRESSION: 1. Left periorbital/supraorbital contusion. 2. Intact globes. Hyperdensity within the left anterior chamber suggestive of hemorrhage. Correlation with physical recommended. No retro-orbital hematoma or other pathology. 3. No acute maxillofacial fracture.   Electronically Signed   By: Jeannine Boga M.D.   On: 07/01/2013 22:33   Anti-infectives: Anti-infectives   Start     Dose/Rate Route Frequency Ordered Stop   07/03/13 2200  cefTRIAXone (ROCEPHIN) 1 g in dextrose 5 % 50 mL IVPB  Status:  Discontinued     1 g 100 mL/hr over 30 Minutes Intravenous Every 24 hours 07/02/13 0007 07/02/13 0743   07/03/13 2200  azithromycin (ZITHROMAX) 500 mg in dextrose 5 % 250 mL IVPB  Status:  Discontinued     500 mg 250 mL/hr over 60 Minutes Intravenous Every 24 hours 07/02/13 0007 07/02/13 0743   07/02/13 1115  ceFAZolin (ANCEF) IVPB 2 g/50 mL premix  Status:  Discontinued     2 g 100 mL/hr over 30 Minutes Intravenous  Once 07/02/13 1107 07/02/13 1108   07/02/13 1115  vancomycin (VANCOCIN) IVPB 1000 mg/200 mL premix     1,000 mg 200 mL/hr over 60 Minutes Intravenous  Once 07/02/13 1108 07/02/13 1222   07/02/13 1104  ceFAZolin (ANCEF) 2-3 GM-% IVPB SOLR  Status:  Discontinued    Comments:  Nelly Laurence   : cabinet override      07/02/13 1104 07/02/13 1150   07/01/13 2345  cefTRIAXone (ROCEPHIN) 1 g in dextrose 5 % 50 mL IVPB     1 g 100 mL/hr over 30 Minutes Intravenous  Once 07/01/13 2331 07/02/13 0200   07/01/13 2345  azithromycin (ZITHROMAX) 500 mg in dextrose 5 % 250 mL IVPB     500 mg 250 mL/hr over 60 Minutes Intravenous  Once 07/01/13 2331 07/02/13 0327      Assessment/Plan: s/p * No surgery found * Vein map reviewed and discussed with patient. Multiple IVs and left arm with right arm being saved. The neck  shows marginal size of basilic vein in the right arm. We'll plan for AV graft versus AV fistula depending on operative findings. Discussed this at length with the patient regarding her need for long-term hemodialysis access. Plan for surgery tomorrow with Dr. Kellie Simmering   LOS: 5 days   EARLY, TODD 07/06/2013, 4:16 PM

## 2013-07-07 ENCOUNTER — Encounter (HOSPITAL_COMMUNITY)
Admission: EM | Disposition: A | Discharge status: Home Health | Payer: Self-pay | Source: Home / Self Care | Attending: Internal Medicine

## 2013-07-07 ENCOUNTER — Encounter (HOSPITAL_COMMUNITY): Payer: Self-pay | Admitting: Certified Registered Nurse Anesthetist

## 2013-07-07 ENCOUNTER — Other Ambulatory Visit: Payer: Self-pay | Admitting: *Deleted

## 2013-07-07 ENCOUNTER — Encounter (HOSPITAL_COMMUNITY): Payer: Medicare HMO | Admitting: Anesthesiology

## 2013-07-07 ENCOUNTER — Inpatient Hospital Stay (HOSPITAL_COMMUNITY): Payer: Medicare HMO | Admitting: Anesthesiology

## 2013-07-07 ENCOUNTER — Telehealth: Payer: Self-pay | Admitting: Vascular Surgery

## 2013-07-07 DIAGNOSIS — N186 End stage renal disease: Secondary | ICD-10-CM

## 2013-07-07 DIAGNOSIS — Z4931 Encounter for adequacy testing for hemodialysis: Secondary | ICD-10-CM

## 2013-07-07 DIAGNOSIS — N185 Chronic kidney disease, stage 5: Secondary | ICD-10-CM | POA: Diagnosis not present

## 2013-07-07 HISTORY — PX: AV FISTULA PLACEMENT: SHX1204

## 2013-07-07 LAB — CBC
HCT: 28.4 % — ABNORMAL LOW (ref 36.0–46.0)
HEMOGLOBIN: 8.9 g/dL — AB (ref 12.0–15.0)
MCH: 27.3 pg (ref 26.0–34.0)
MCHC: 31.3 g/dL (ref 30.0–36.0)
MCV: 87.1 fL (ref 78.0–100.0)
Platelets: 163 10*3/uL (ref 150–400)
RBC: 3.26 MIL/uL — ABNORMAL LOW (ref 3.87–5.11)
RDW: 14.3 % (ref 11.5–15.5)
WBC: 7.3 10*3/uL (ref 4.0–10.5)

## 2013-07-07 LAB — RENAL FUNCTION PANEL
Albumin: 2.4 g/dL — ABNORMAL LOW (ref 3.5–5.2)
BUN: 17 mg/dL (ref 6–23)
CO2: 27 mEq/L (ref 19–32)
Calcium: 8.3 mg/dL — ABNORMAL LOW (ref 8.4–10.5)
Chloride: 104 mEq/L (ref 96–112)
Creatinine, Ser: 4.23 mg/dL — ABNORMAL HIGH (ref 0.50–1.10)
GFR calc non Af Amer: 9 mL/min — ABNORMAL LOW (ref >90)
GFR, EST AFRICAN AMERICAN: 11 mL/min — AB (ref >90)
GLUCOSE: 122 mg/dL — AB (ref 70–99)
POTASSIUM: 3.2 meq/L — AB (ref 3.7–5.3)
Phosphorus: 3.9 mg/dL (ref 2.3–4.6)
Sodium: 144 mEq/L (ref 137–147)

## 2013-07-07 LAB — SURGICAL PCR SCREEN
MRSA, PCR: NEGATIVE
Staphylococcus aureus: NEGATIVE

## 2013-07-07 SURGERY — ARTERIOVENOUS (AV) FISTULA CREATION
Anesthesia: Monitor Anesthesia Care | Laterality: Right

## 2013-07-07 MED ORDER — EPHEDRINE SULFATE 50 MG/ML IJ SOLN
INTRAMUSCULAR | Status: DC | PRN
  Administered 2013-07-07: 15 mg via INTRAVENOUS
  Administered 2013-07-07 (×2): 5 mg via INTRAVENOUS
  Administered 2013-07-07: 10 mg via INTRAVENOUS
  Administered 2013-07-07: 5 mg via INTRAVENOUS
  Administered 2013-07-07: 10 mg via INTRAVENOUS

## 2013-07-07 MED ORDER — PHENYLEPHRINE 40 MCG/ML (10ML) SYRINGE FOR IV PUSH (FOR BLOOD PRESSURE SUPPORT)
PREFILLED_SYRINGE | INTRAVENOUS | Status: AC
  Filled 2013-07-07: qty 20

## 2013-07-07 MED ORDER — HEPARIN SODIUM (PORCINE) 1000 UNIT/ML DIALYSIS
20.0000 [IU]/kg | INTRAMUSCULAR | Status: DC | PRN
  Filled 2013-07-07: qty 2

## 2013-07-07 MED ORDER — LIDOCAINE HCL (CARDIAC) 20 MG/ML IV SOLN
INTRAVENOUS | Status: DC | PRN
  Administered 2013-07-07: 80 mg via INTRAVENOUS

## 2013-07-07 MED ORDER — OXYCODONE HCL 5 MG PO TABS: 5.0000 mg | ORAL_TABLET | Freq: Once | ORAL | Status: DC | PRN

## 2013-07-07 MED ORDER — ONDANSETRON HCL 4 MG/2ML IJ SOLN
INTRAMUSCULAR | Status: DC | PRN
  Administered 2013-07-07: 4 mg via INTRAVENOUS

## 2013-07-07 MED ORDER — PHENYLEPHRINE HCL 10 MG/ML IJ SOLN
INTRAMUSCULAR | Status: DC | PRN
  Administered 2013-07-07 (×4): 80 ug via INTRAVENOUS

## 2013-07-07 MED ORDER — CLONIDINE HCL 0.2 MG PO TABS
0.2000 mg | ORAL_TABLET | Freq: Two times a day (BID) | ORAL | Status: DC
  Administered 2013-07-07 – 2013-07-08 (×2): 0.2 mg via ORAL
  Filled 2013-07-07 (×4): qty 1

## 2013-07-07 MED ORDER — PROPOFOL 10 MG/ML IV BOLUS
INTRAVENOUS | Status: AC
  Filled 2013-07-07: qty 20

## 2013-07-07 MED ORDER — SODIUM CHLORIDE 0.9 % IR SOLN
Status: DC | PRN
  Administered 2013-07-07: 09:00:00

## 2013-07-07 MED ORDER — MIDAZOLAM HCL 2 MG/2ML IJ SOLN
INTRAMUSCULAR | Status: AC
  Filled 2013-07-07: qty 2

## 2013-07-07 MED ORDER — ONDANSETRON HCL 4 MG/2ML IJ SOLN: 4.0000 mg | Freq: Four times a day (QID) | INTRAMUSCULAR | Status: DC | PRN

## 2013-07-07 MED ORDER — GLYCOPYRROLATE 0.2 MG/ML IJ SOLN
INTRAMUSCULAR | Status: DC | PRN
  Administered 2013-07-07: 0.2 mg via INTRAVENOUS

## 2013-07-07 MED ORDER — DOXERCALCIFEROL 4 MCG/2ML IV SOLN
INTRAVENOUS | Status: AC
  Filled 2013-07-07: qty 2

## 2013-07-07 MED ORDER — PROPOFOL 10 MG/ML IV BOLUS
INTRAVENOUS | Status: DC | PRN
  Administered 2013-07-07: 150 mg via INTRAVENOUS
  Administered 2013-07-07: 50 mg via INTRAVENOUS

## 2013-07-07 MED ORDER — EPHEDRINE SULFATE 50 MG/ML IJ SOLN
INTRAMUSCULAR | Status: AC
  Filled 2013-07-07: qty 1

## 2013-07-07 MED ORDER — SODIUM CHLORIDE 0.9 % IJ SOLN
INTRAMUSCULAR | Status: AC
  Filled 2013-07-07: qty 10

## 2013-07-07 MED ORDER — FENTANYL CITRATE 0.05 MG/ML IJ SOLN
INTRAMUSCULAR | Status: AC
  Filled 2013-07-07: qty 5

## 2013-07-07 MED ORDER — FENTANYL CITRATE 0.05 MG/ML IJ SOLN
INTRAMUSCULAR | Status: DC | PRN
  Administered 2013-07-07: 50 ug via INTRAVENOUS

## 2013-07-07 MED ORDER — PHENYLEPHRINE HCL 10 MG/ML IJ SOLN
10.0000 mg | INTRAVENOUS | Status: DC | PRN
  Administered 2013-07-07: 25 ug/min via INTRAVENOUS

## 2013-07-07 MED ORDER — FENTANYL CITRATE 0.05 MG/ML IJ SOLN: 25.0000 ug | INTRAMUSCULAR | Status: DC | PRN

## 2013-07-07 MED ORDER — ALBUTEROL SULFATE HFA 108 (90 BASE) MCG/ACT IN AERS
INHALATION_SPRAY | RESPIRATORY_TRACT | Status: DC | PRN
  Administered 2013-07-07 (×3): 2 via RESPIRATORY_TRACT

## 2013-07-07 MED ORDER — 0.9 % SODIUM CHLORIDE (POUR BTL) OPTIME
TOPICAL | Status: DC | PRN
  Administered 2013-07-07: 1000 mL

## 2013-07-07 MED ORDER — ONDANSETRON HCL 4 MG/2ML IJ SOLN
INTRAMUSCULAR | Status: AC
  Filled 2013-07-07: qty 2

## 2013-07-07 MED ORDER — LIDOCAINE HCL (CARDIAC) 20 MG/ML IV SOLN
INTRAVENOUS | Status: AC
  Filled 2013-07-07: qty 5

## 2013-07-07 MED ORDER — SODIUM CHLORIDE 0.9 % IV SOLN
INTRAVENOUS | Status: DC | PRN
  Administered 2013-07-07: 08:00:00 via INTRAVENOUS

## 2013-07-07 MED ORDER — OXYCODONE HCL 5 MG/5ML PO SOLN: 5.0000 mg | Freq: Once | ORAL | Status: DC | PRN

## 2013-07-07 SURGICAL SUPPLY — 37 items
ADH SKN CLS APL DERMABOND .7 (GAUZE/BANDAGES/DRESSINGS) ×1
ARMBAND PINK RESTRICT EXTREMIT (MISCELLANEOUS) ×3 IMPLANT
BLADE 10 SAFETY STRL DISP (BLADE) ×1 IMPLANT
CANISTER SUCTION 2500CC (MISCELLANEOUS) ×3 IMPLANT
CATH EMB 3FR 80CM (CATHETERS) ×2 IMPLANT
CLIP TI MEDIUM 6 (CLIP) ×3 IMPLANT
CLIP TI WIDE RED SMALL 6 (CLIP) ×3 IMPLANT
COVER PROBE W GEL 5X96 (DRAPES) IMPLANT
COVER SURGICAL LIGHT HANDLE (MISCELLANEOUS) ×3 IMPLANT
DERMABOND ADVANCED (GAUZE/BANDAGES/DRESSINGS) ×2
DERMABOND ADVANCED .7 DNX12 (GAUZE/BANDAGES/DRESSINGS) ×1 IMPLANT
DRAIN PENROSE 1/4X12 LTX STRL (WOUND CARE) ×1 IMPLANT
ELECT REM PT RETURN 9FT ADLT (ELECTROSURGICAL) ×3
ELECTRODE REM PT RTRN 9FT ADLT (ELECTROSURGICAL) ×1 IMPLANT
GEL ULTRASOUND 20GR AQUASONIC (MISCELLANEOUS) ×2 IMPLANT
GLOVE BIO SURGEON STRL SZ 6.5 (GLOVE) ×2 IMPLANT
GLOVE BIO SURGEONS STRL SZ 6.5 (GLOVE) ×2
GLOVE BIOGEL PI IND STRL 6.5 (GLOVE) IMPLANT
GLOVE BIOGEL PI INDICATOR 6.5 (GLOVE) ×4
GLOVE SS BIOGEL STRL SZ 7 (GLOVE) ×1 IMPLANT
GLOVE SUPERSENSE BIOGEL SZ 7 (GLOVE) ×2
GLOVE SURG SS PI 6.5 STRL IVOR (GLOVE) ×6 IMPLANT
GOWN STRL REUS W/ TWL LRG LVL3 (GOWN DISPOSABLE) ×3 IMPLANT
GOWN STRL REUS W/TWL LRG LVL3 (GOWN DISPOSABLE) ×9
KIT BASIN OR (CUSTOM PROCEDURE TRAY) ×3 IMPLANT
KIT ROOM TURNOVER OR (KITS) ×3 IMPLANT
NS IRRIG 1000ML POUR BTL (IV SOLUTION) ×3 IMPLANT
PACK CV ACCESS (CUSTOM PROCEDURE TRAY) ×3 IMPLANT
PAD ARMBOARD 7.5X6 YLW CONV (MISCELLANEOUS) ×6 IMPLANT
SUT PROLENE 6 0 BV (SUTURE) ×3 IMPLANT
SUT VIC AB 3-0 SH 27 (SUTURE) ×3
SUT VIC AB 3-0 SH 27X BRD (SUTURE) ×1 IMPLANT
SYR TB 1ML LUER SLIP (SYRINGE) ×2 IMPLANT
TOWEL OR 17X24 6PK STRL BLUE (TOWEL DISPOSABLE) ×3 IMPLANT
TOWEL OR 17X26 10 PK STRL BLUE (TOWEL DISPOSABLE) ×3 IMPLANT
UNDERPAD 30X30 INCONTINENT (UNDERPADS AND DIAPERS) ×3 IMPLANT
WATER STERILE IRR 1000ML POUR (IV SOLUTION) ×3 IMPLANT

## 2013-07-07 NOTE — Anesthesia Procedure Notes (Addendum)
Procedure Name: LMA Insertion Date/Time: 07/07/2013 7:50 AM Performed by: Trixie Deis A Pre-anesthesia Checklist: Patient identified, Timeout performed, Emergency Drugs available, Suction available and Patient being monitored Patient Re-evaluated:Patient Re-evaluated prior to inductionOxygen Delivery Method: Circle system utilized Preoxygenation: Pre-oxygenation with 100% oxygen Intubation Type: IV induction Ventilation: Mask ventilation without difficulty LMA: LMA inserted LMA Size: 4.0 Placement Confirmation: positive ETCO2 and breath sounds checked- equal and bilateral Tube secured with: Tape Dental Injury: Teeth and Oropharynx as per pre-operative assessment    Procedure Name: Intubation Date/Time: 07/07/2013 8:12 AM Performed by: Trixie Deis A Pre-anesthesia Checklist: Patient identified, Timeout performed, Emergency Drugs available, Suction available and Patient being monitored Patient Re-evaluated:Patient Re-evaluated prior to inductionOxygen Delivery Method: Circle system utilized Preoxygenation: Pre-oxygenation with 100% oxygen Ventilation: Mask ventilation without difficulty and Oral airway inserted - appropriate to patient size Laryngoscope Size: Mac and 3 Grade View: Grade I Tube type: Oral Tube size: 7.0 mm Number of attempts: 1 Airway Equipment and Method: Stylet Placement Confirmation: ETT inserted through vocal cords under direct vision,  positive ETCO2 and breath sounds checked- equal and bilateral Secured at: 21 cm Tube secured with: Tape Dental Injury: Teeth and Oropharynx as per pre-operative assessment

## 2013-07-07 NOTE — Clinical Social Work Placement (Addendum)
    Clinical Social Work Department CLINICAL SOCIAL WORK PLACEMENT NOTE 07/08/2013  Patient:  Jaclyn Morgan, Jaclyn Morgan  Account Number:  [id number] Admit date:  07/01/2013  Clinical Social Worker:  Butch Penny CROWDER, LCSWA  Date/time:  07/05/2013 01:05 PM  Clinical Social Work is seeking post-discharge placement for this patient at the following level of care:   SKILLED NURSING   (*CSW will update this form in Epic as items are completed)   07/05/2013  Patient/family provided with [facility] Department of Clinical Social Work's list of facilities offering this level of care within the geographic area requested by the patient (or if unable, by the patient's family).  07/05/2013  Patient/family informed of their freedom to choose among providers that offer the needed level of care, that participate in Medicare, Medicaid or managed care program needed by the patient, have an available bed and are willing to accept the patient.  07/05/2013  Patient/family informed of [facility]' ownership interest in [facility], as well as of the fact that they are under no obligation to receive care at this facility.  PASARR submitted to EDS on 07/05/2013 PASARR number received on 07/05/2013  FL2 transmitted to all facilities in geographic area requested by pt/family on  07/05/2013 FL2 transmitted to all facilities within larger geographic area on   Patient informed that his/her managed care company has contracts with or will negotiate with  certain facilities, including the following:   [employer] ([place])- Notified Shae. Prior auth required     Patient/family informed of bed offers received:  07/06/2013 Patient chooses bed at [facility] Physician recommends and patient chooses bed at    Patient to be transferred to [facility] on   Patient to be transferred to facility by Ambulance  [facility]) Patient and family notified of transfer on  Name of family member  notified:    The following physician request were entered in Epic: Physician Request  Please prepare priority discharge summary and prescriptions.  Please prepare priority discharge summary, including medications  Please sign FL2.    Additional Comments: 07/08/13  Patient and daughter Jaclyn Morgan accepted a bed at [facility] and went to sign admisssion papers prior to patient's d/c. CSW received call from Santiago Glad at the facility that patient's daughter has now refused placement. CSW spoke to daughter Jaclyn Morgan- who stated that the facility did  not have full bed rails on their beds. CSW explained that no SNF's have full rails as they are considered a form of restraint by the [facility] and they cannot allow them. Daughter stated that she would "worry herself to death" that her mother would fall out of bed and thus- she will plan to take her home.  CSW encouraged daughter to reconsider but she refused. Stated that she and her brother would manage her care.  RNCM Camille notified and worked with Dr. Venetia Constable to arrange home health.  EMS transport changed to take patient home.  Daughter called muliple times wanting to know what was taking so long for EMS to pick up her mother. CSW explained that the service was backed up and that they would pick her up as soon as possible. Nursing notified. CSW signing off.

## 2013-07-07 NOTE — Clinical Social Work Psychosocial (Addendum)
Clinical Social Work Department BRIEF PSYCHOSOCIAL ASSESSMENT 07/05/2013  Patient:  Jaclyn Morgan, Jaclyn Morgan     Account Number:  [id number]     Admit date:  07/01/2013  Clinical Social Worker:  Iona Coach  Date/Time:  07/05/2013 02:45 PM  Referred by:  Physician  Date Referred:  07/05/2013 Referred for  SNF Placement   Other Referral:   Interview type:  Other - See comment Other interview type:   Patient and daughter Carline    PSYCHOSOCIAL DATA Living Status:  WITH ADULT CHILDREN Admitted from facility:   Level of care:   Primary support name:  Doy Mince  [phone] Primary support relationship to patient:  CHILD, ADULT Degree of support available:   Strong    CURRENT CONCERNS Current Concerns  Post-Acute Placement   Other Concerns:    SOCIAL WORK ASSESSMENT / PLAN 80 year old female- admitted from home where she lives with her daughter [name].  Patient is alert and oriented to person and occasionally to place but has noticible confusion otherwise. She requests that her daughter be contacted. PT is recommending SNF placement. CSW met with daughter and patient and discussed benefits of SNF placement. Bed search process discussed and patient/daughter agreed. Fl2 completed an placed on chart for MD's signature.   Assessment/plan status:  Psychosocial Support/Ongoing Assessment of Needs Other assessment/ plan:   Information/referral to community resources:   SNF bed list provided    PATIENT'S/FAMILY'S RESPONSE TO PLAN OF CARE: Patient remains somewhat confused but was able to verbalize agreement for short term SNF. She defers to her daughter to make her health care decisions.  Daughter is extremely supportive and invovled in her mother's care. She wants her to return home once she is stronger.  CSW will assist with SNF placement. Patient/daughter deny any current concerns or questions.

## 2013-07-07 NOTE — Interval H&P Note (Signed)
History and Physical Interval Note:  07/07/2013 7:28 AM  Jaclyn Morgan  has presented today for surgery, with the diagnosis of End stage renal disease   The various methods of treatment have been discussed with the patient and family. After consideration of risks, benefits and other options for treatment, the patient has consented to  Procedure(s): ARTERIOVENOUS (AV) FISTULA CREATION VERSUS ARTERIOVENOUS GORTEX GRAFT INSERTION (Right) as a surgical intervention .  The patient's history has been reviewed, patient examined, no change in status, stable for surgery.  I have reviewed the patient's chart and labs.  Questions were answered to the patient's satisfaction.     [name]

## 2013-07-07 NOTE — Anesthesia Preprocedure Evaluation (Addendum)
Anesthesia Evaluation  Patient identified by MRN, date of birth, ID band Patient awake    Reviewed: Allergy & Precautions, H&P , NPO status , Patient's Chart, lab work & pertinent test results  Airway Mallampati: II TM Distance: >3 FB Neck ROM: full    Dental  (+) Partial Upper, Partial Lower, Dental Advisory Given, Teeth Intact   Pulmonary          Cardiovascular hypertension,     Neuro/Psych Anxiety    GI/Hepatic   Endo/Other  Hypothyroidism obese  Renal/GU ESRFRenal disease     Musculoskeletal   Abdominal   Peds  Hematology   Anesthesia Other Findings   Reproductive/Obstetrics                          Anesthesia Physical Anesthesia Plan  ASA: III  Anesthesia Plan: MAC   Post-op Pain Management:    Induction: Intravenous  Airway Management Planned: Simple Face Mask  Additional Equipment:   Intra-op Plan:   Post-operative Plan:   Informed Consent: I have reviewed the patients History and Physical, chart, labs and discussed the procedure including the risks, benefits and alternatives for the proposed anesthesia with the patient or authorized representative who has indicated his/her understanding and acceptance.     Plan Discussed with: CRNA, Anesthesiologist and Surgeon  Anesthesia Plan Comments:         Anesthesia Quick Evaluation

## 2013-07-07 NOTE — Anesthesia Postprocedure Evaluation (Signed)
  Anesthesia Post-op Note  Patient: Jaclyn Morgan  Procedure(s) Performed: Procedure(s): ARTERIOVENOUS (AV) FISTULA CREATION  (Right)  Patient Location: PACU  Anesthesia Type:General  Level of Consciousness: awake and alert   Airway and Oxygen Therapy: Patient Spontanous Breathing  Post-op Pain: mild  Post-op Assessment: Post-op Vital signs reviewed  Post-op Vital Signs: stable  Last Vitals:  Filed Vitals:   07/07/13 1028  BP: 165/98  Pulse: 111  Temp: 36.6 C  Resp: 18    Complications: No apparent anesthesia complications

## 2013-07-07 NOTE — Progress Notes (Signed)
Patient discussed with Dr. Venetia Constable- she had her permanent fistula placed this morning and will receive a dialysis treatment in the a.m.- then plan d/c if medically stable. CSW notified patient's daughter [name] who is in her mother's room and she will let her mother know. CSW left a message for Glyn Ade, RN Liason re: above.  Lorie Phenix. [place], [place]

## 2013-07-07 NOTE — Progress Notes (Signed)
BACKGROUND 80 y.o. year-old female with known advanced CKD5 d/t hypertensive nephrosclerosis, followed by Dr. Justin Mend.  On review office records was non-compliant with appts, cancelled vascular acces surgery with Dr. Oneida Alar in Dec 2014 and refused to start HD when advised in March 2015 by Dr. Justin Mend.  Was brought to the ED by her family because of uremic symptoms. She was found to have a creatinine on 7.96, BUN 81, CO2 of 16, elevated troponin of 0.65, Hb 7.8, CXR with mild vascular congestion. CT brain (done b/o recent fall/confusion) no acute change  TDC was placed by IR on 07/02/13 and HD was initiated for ESRD  ASSESSMENT/RECOMMENDATIONS  1. ESRD - Pt/family agreed  and HD was initiated 6/20.  2nd  6/22, 3rd 6/24 now anticipate TTS schedule. CLIP pending- do not have spot yet.  Consult VVS  for AVF- s/p AVF this AM.  Will also need dialysis today yet 2. Anemia - Initiated darbepoetin with HD 100 QSat. Iron stores adequate. S/p transfusion Sunday- hgb 8.9 3. Secondary hyperpara - on po calcitriol at home -  changed to hectorol with HD. PTH 259. Phos/calc at goal no binders- phos actually low ,  replaced 6/24 4. Hypertension with hypertensive urgency - d/t failure to take BP meds at home.  improved. Clonidine/cardura/labetalol plus dialysis- will challenge EDW hopefully be able to wean meds at some point 5. Confusion/AMS - at least in part uremic. CT after recent fall no stroke or bleed.  improved overall 6. NSTEMI - presume demand ischemia. Cards is following. Heparin stopped. Advised BB and ASA. Enzymes elev but flat. Echo 55-60% No arrythmia. No curther w/u advised by Dr. Johnsie Cancel. 7. HLD - statin 8. Orbital contusion post fall 9. Dispo- as I understand may need SNF   Subjective:  Less confused, family is pleased that she is better  S/p AVF- noted plans for discharge to [facility] but we do not yet have an OP dialysis spot so she cannot go until that is finalized   Objective Vital signs  in last 24 hours: Filed Vitals:   07/07/13 1030 07/07/13 1045 07/07/13 1100 07/07/13 1110  BP: 204/88 211/83 209/77   Pulse: 116 117 113 1  Temp:      TempSrc:      Resp:      Height:      Weight:      SpO2:       Weight change: -3.3 kg (-7 lb 4.4 oz)  Intake/Output Summary (Last 24 hours) at 07/07/13 1159 Last data filed at 07/07/13 1006  Gross per 24 hour  Intake    690 ml  Output      0 ml  Net    690 ml   Physical Exam:   Alert, but confused TDC right chest dry dressing Lungs with decreased BS No rales S1S2 No S3 No rub Abd obese, soft, not tender Ext some edema - new right AVF with thrill  Recent Labs Lab 07/01/13 2055 07/02/13 1950 07/02/13 2000 07/03/13 0500 07/04/13 0309 07/05/13 1318 07/06/13 0350  NA 142  --  144 144 143 143 143  K 5.9*  --  4.2 3.5* 3.3* 3.4* 3.0*  CL 103  --  105 104 103 103 104  CO2 16*  --  19 22 22 25 27   GLUCOSE 104*  --  140* 95 104* 101* 106*  BUN 81*  --  83* 44* 45* 21 9  CREATININE 7.96*  --  8.12* 5.18* 5.89* 4.09* 2.67*  CALCIUM 9.5 8.8 9.1 8.7 8.6 8.4 8.2*  PHOS  --   --  5.5* 3.9 3.7 2.8 1.4*    Recent Labs Lab 07/01/13 2055  07/04/13 0309 07/05/13 1318 07/06/13 0350  AST 29  --   --   --   --   ALT 9  --   --   --   --   ALKPHOS 61  --   --   --   --   BILITOT 0.8  --   --   --   --   PROT 6.5  --   --   --   --   ALBUMIN 2.9*  < > 2.7* 2.6* 2.3*  < > = values in this interval not displayed.  Recent Labs Lab 07/01/13 2055  LIPASE 29    Recent Labs Lab 07/05/13 0409 07/05/13 1319 07/06/13 0350 07/07/13 0321  WBC 7.2 7.1 6.5 7.3  HGB 8.8* 8.8* 9.1* 8.9*  HCT 27.3* 27.4* 28.9* 28.4*  MCV 84.5 85.4 88.1 87.1  PLT 151 159 150 163    Recent Labs Lab 07/01/13 2055 07/02/13 0819 07/02/13 1327  TROPONINI 0.65* 0.40* 0.36*    Recent Labs Lab 07/01/13 2048  GLUCAP 97       Recent Labs Lab 07/02/13 0819 07/02/13 1950  IRON 49 62  TIBC 157* 145*  FERRITIN 710*  --     Studies/Results: No results found. Medications:   . aspirin  325 mg Oral Daily  . atorvastatin  40 mg Oral q1800  . cefUROXime (ZINACEF)  IV  1.5 g Intravenous On Call to OR  . cloNIDine  0.1 mg Oral BID  . darbepoetin (ARANESP) injection - DIALYSIS  100 mcg Intravenous Q Sat-HD  . doxazosin  4 mg Oral BID  . doxercalciferol  1 mcg Intravenous Q T,Th,Sa-HD  . enoxaparin (LOVENOX) injection  30 mg Subcutaneous Q24H  . labetalol  200 mg Oral BID  . multivitamin with minerals  1 tablet Oral Daily   GOLDSBOROUGH,KELLIE A   07/07/2013, 11:59 AM

## 2013-07-07 NOTE — Progress Notes (Signed)
[facility] PROGRESS NOTE  80 y.o. female with a history of Stage IV CKD, and HTN who was brought to the ED by her daughter due to increased confusion worsening over 2-3 days. Patient tripped in her home 1 week ago and suffered a contusion to the Left orbit.  She was advised in March of 2015 that she needed Dialysis treatments, but she refused at that time. In the ED, she was found to have a BUN/Cr = 81 / 7.96 with hyperkalemia of 5.9. She also had a Pro BNP = 70 K, and a Troponin = 0.65.  Since admission, seen by Cards and Nephrology.  Placed HD catheter and started on HD this admission  Assessment/Plan: ESRD/volume overload/hyperkalemia:no distress  - S/p HD catheter placement 6/20, HD started 6/20  - BP much improved, cont to titrate meds. - VVS consulted, A/V fistule 6.24.2015. HD in am. - Cont HD Per Renal, SNF in am.  Accelerated Hypertension: - high, ? Due to pain. Narcotics. - Cont. clonidine, labetalol  - Hydralazine PRN .  Anemia of chronic disease: - Drop in Hb, no overt blood loss  - Anemia panel c/w chronic disease  - Transfused 2 units PRBC on 6/21  - Aranesp during HD.  Uremic encephalopathy: - improving, suspect may have some baseline cognitive dysfunction too.  NSTEMI/Demand ischemia: - No clinical symptoms of angina/EKG unremarkable  - Echo normal EF 55-60% with no RWMA's  - Per cards no need for further cardiac w/u   Fall/L orbital contusion:  Hyperkalemia  - resolved with kayexalate, HD   DVT proph: lovenox      Code Status: full Family Communication: none  Disposition Plan: inpatinet   Consultants:  Renal  VVS  Cardiology  Procedures:  Vein mapping  Antibiotics:  None  HPI/Subjective: No complains  Objective: Filed Vitals:   07/07/13 1030 07/07/13 1045 07/07/13 1100 07/07/13 1110  BP: 204/88 211/83 209/77   Pulse: 116 117 113 1  Temp:      TempSrc:      Resp:      Height:      Weight:      SpO2:         Intake/Output Summary (Last 24 hours) at 07/07/13 1202 Last data filed at 07/07/13 1006  Gross per 24 hour  Intake    690 ml  Output      0 ml  Net    690 ml   Filed Weights   07/05/13 1642 07/06/13 0618 07/07/13 0528  Weight: 73.9 kg (162 lb 14.7 oz) 73.936 kg (163 lb) 73.1 kg (161 lb 2.5 oz)    Exam:  General: Alert, awake, oriented x3. HEENT: No bruits, no goiter.  Heart: Regular rate and rhythm. Lungs: Good air movement, clear Abdomen: Soft, nontender, nondistended, positive bowel sounds.    Data Reviewed: Basic Metabolic Panel:  Recent Labs Lab 07/02/13 2000 07/03/13 0500 07/04/13 0309 07/05/13 1318 07/06/13 0350  NA 144 144 143 143 143  K 4.2 3.5* 3.3* 3.4* 3.0*  CL 105 104 103 103 104  CO2 19 22 22 25 27   GLUCOSE 140* 95 104* 101* 106*  BUN 83* 44* 45* 21 9  CREATININE 8.12* 5.18* 5.89* 4.09* 2.67*  CALCIUM 9.1 8.7 8.6 8.4 8.2*  PHOS 5.5* 3.9 3.7 2.8 1.4*   Liver Function Tests:  Recent Labs Lab 07/01/13 2055 07/02/13 2000 07/03/13 0500 07/04/13 0309 07/05/13 1318 07/06/13 0350  AST 29  --   --   --   --   --  ALT 9  --   --   --   --   --   ALKPHOS 61  --   --   --   --   --   BILITOT 0.8  --   --   --   --   --   PROT 6.5  --   --   --   --   --   ALBUMIN 2.9* 2.5* 2.4* 2.7* 2.6* 2.3*    Recent Labs Lab 07/01/13 2055  LIPASE 29   No results found for this basename: AMMONIA,  in the last 168 hours CBC:  Recent Labs Lab 07/04/13 0309 07/05/13 0409 07/05/13 1319 07/06/13 0350 07/07/13 0321  WBC 8.1 7.2 7.1 6.5 7.3  HGB 8.8* 8.8* 8.8* 9.1* 8.9*  HCT 27.0* 27.3* 27.4* 28.9* 28.4*  MCV 83.3 84.5 85.4 88.1 87.1  PLT 145* 151 159 150 163   Cardiac Enzymes:  Recent Labs Lab 07/01/13 2055 07/02/13 0819 07/02/13 1327  TROPONINI 0.65* 0.40* 0.36*   BNP (last 3 results)  Recent Labs  07/01/13 2055  PROBNP >70000.0*   CBG:  Recent Labs Lab 07/01/13 2048  GLUCAP 97    Recent Results (from the past 240 hour(s))   CULTURE, BLOOD (ROUTINE X 2)     Status: None   Collection Time    07/01/13 11:41 PM      Result Value Ref Range Status   Specimen Description BLOOD RIGHT ANTECUBITAL   Final   Special Requests BOTTLES DRAWN AEROBIC AND ANAEROBIC 5CC   Final   Culture  Setup Time     Final   Value: 07/02/2013 04:07     Performed at [facility]   Culture     Final   Value: STAPHYLOCOCCUS SPECIES (COAGULASE NEGATIVE)     Note: THE SIGNIFICANCE OF ISOLATING THIS ORGANISM FROM A SINGLE SET OF BLOOD CULTURES WHEN MULTIPLE SETS ARE DRAWN IS UNCERTAIN. PLEASE NOTIFY THE MICROBIOLOGY DEPARTMENT WITHIN ONE WEEK IF SPECIATION AND SENSITIVITIES ARE REQUIRED.     Note: Gram Stain Report Called to,Read Back By and Verified With: PEGGY GERHARD 07/03/13 @ 5:59PM BY RUSCOE A.     Performed at [facility]   Report Status 07/05/2013 FINAL   Final  CULTURE, BLOOD (ROUTINE X 2)     Status: None   Collection Time    07/01/13 11:51 PM      Result Value Ref Range Status   Specimen Description BLOOD RIGHT HAND   Final   Special Requests BOTTLES DRAWN AEROBIC ONLY 5CC   Final   Culture  Setup Time     Final   Value: 07/02/2013 04:08     Performed at [facility]   Culture     Final   Value:        BLOOD CULTURE RECEIVED NO GROWTH TO DATE CULTURE WILL BE HELD FOR 5 DAYS BEFORE ISSUING A FINAL NEGATIVE REPORT     Performed at [facility]   Report Status PENDING   Incomplete  MRSA PCR SCREENING     Status: Abnormal   Collection Time    07/02/13  5:15 AM      Result Value Ref Range Status   MRSA by PCR POSITIVE (*) NEGATIVE Final   Comment:            The GeneXpert MRSA Assay (FDA     approved for NASAL specimens     only), is one component of a  comprehensive MRSA colonization     surveillance program. It is not     intended to diagnose MRSA     infection nor to guide or     monitor treatment for     MRSA infections.     RESULT CALLED TO, READ BACK BY AND VERIFIED WITH:      MUHORO,D RN 07/02/13 0942 [place]  SURGICAL PCR SCREEN     Status: None   Collection Time    07/06/13 11:03 PM      Result Value Ref Range Status   MRSA, PCR NEGATIVE  NEGATIVE Final   Staphylococcus aureus NEGATIVE  NEGATIVE Final   Comment:            The Xpert SA Assay (FDA     approved for NASAL specimens     in patients over 43 years of age),     is one component of     a comprehensive surveillance     program.  Test performance has     been validated by [employer] for patients greater     than or equal to 52 year old.     It is not intended     to diagnose infection nor to     guide or monitor treatment.     Studies: No results found.  Scheduled Meds: . aspirin  325 mg Oral Daily  . atorvastatin  40 mg Oral q1800  . cefUROXime (ZINACEF)  IV  1.5 g Intravenous On Call to OR  . cloNIDine  0.1 mg Oral BID  . darbepoetin (ARANESP) injection - DIALYSIS  100 mcg Intravenous Q Sat-HD  . doxazosin  4 mg Oral BID  . doxercalciferol  1 mcg Intravenous Q T,Th,Sa-HD  . enoxaparin (LOVENOX) injection  30 mg Subcutaneous Q24H  . labetalol  200 mg Oral BID  . multivitamin with minerals  1 tablet Oral Daily   Continuous Infusions:    Charlynne Cousins  [facility] Pager [phone]. If 8PM-8AM, please contact night-coverage at [email or web address], password [facility] 07/07/2013, 12:02 PM  LOS: 6 days      **Disclaimer: This note may have been dictated with voice recognition software. Similar sounding words can inadvertently be transcribed and this note may contain transcription errors which may not have been corrected upon publication of note.**

## 2013-07-07 NOTE — Op Note (Signed)
OPERATIVE REPORT  Date of Surgery: 07/01/2013 - 07/07/2013  Surgeon: [name], MD  Assistant: Dionicio Stall  Pre-op Diagnosis: End stage renal disease   Post-op Diagnosis: End stage renal disease  Procedure: Procedure(s): ARTERIOVENOUS (AV) FISTULA CREATION -right radial-cephalic Anesthesia: Gen. endotracheal  EBL: Minimal  Complications: None  Procedure Details: To the operating room placed in supine position at which time initially satisfactory LMA anesthesia was administered. This was converted to endotracheal anesthesia before the case started because of some difficulty with the airway. Following insertion of the endotracheal tube there was no further difficulties of the airway. Right upper extremity was prepped Betadine scrub and solution draped in routine sterile manner. The veins had been imaged with the SonoSite ultrasound prior to prepping and draping and it appeared that the cephalic vein in the forearm may be satisfactory for radial to cephalic AV fistula. Therefore incision was made between the radial artery and cephalic vein a longitudinal fashion just proximal to the wrist. Cephalic vein was ligated distally branches ligated with 4-0 silk ties and divided gently dilated with heparinized saline. It was 2-1/2-3 mm in size. A 3 Fogarty catheter was passed proximally and with easily traversed the vein into the upper arm and the basilic system at the antecubital area. It was felt that he possibly satisfactory for a fistula. Veins were not much larger in the upper arm including the basilic so was decided to proceed with this. Radial artery was exposed and circled with Vesseloops. An excellent pulse. No heparin was given. Loop proximally and distally with Vesseloops over 15 blade extended with Potts scissors. It was easily accept a 3 mm dilator had excellent inflow. Vein was carefully measured spatulated and anastomosed end-to-side 6-0 Prolene. Vesseloops released there was good  pulse palpable in the fistula and good Doppler flow up to the antecubital area. No heparin or protamine was given. Adequate hemostasis was achieved wound closed in layers of Vicryl subcuticular fashion with Dermabond patient taken to recovery room in stable condition   [name], MD 07/07/2013 9:25 AM

## 2013-07-07 NOTE — Progress Notes (Signed)
Hemodialysis Outpatient Note: Pt has chair at [facility] on a MWF schedule 12pm chair time. Able to start Monday 07/11/13 at 1130 am to sign paperwork.

## 2013-07-07 NOTE — Telephone Encounter (Addendum)
Message copied by Gena Fray on Thu Jul 07, 2013 12:12 PM ------      Message from: Peter Minium K      Created: Thu Jul 07, 2013 10:44 AM      Regarding: Schedule                   ----- Message -----         From: Gabriel Earing, PA-C         Sent: 07/07/2013   9:11 AM           To: Vvs Charge Pool            S/p right radiocephalic AVF 07/03/28.  F/u with Dr. Kellie Simmering in 8 weeks with duplex.            Thanks,      Samantha ------  07/07/13: lm for pt re appt, dpm

## 2013-07-07 NOTE — Progress Notes (Signed)
Jaclyn Morgan has returned from surgery. Had right arm fistula placed. No difficulties reported. Pt stable. BP elevated. Bp medications given. BP decreased after meds given. Denies pain and discomfort.

## 2013-07-07 NOTE — Discharge Instructions (Signed)
° ° °  07/07/2013 Jaclyn Morgan [id number] [birth year]  Surgeon(s): Mal Misty, MD  Procedure(s): ARTERIOVENOUS (AV) FISTULA CREATION - right radiocephalic AVF  x Do not stick graft for 12 weeks

## 2013-07-07 NOTE — Transfer of Care (Signed)
Immediate Anesthesia Transfer of Care Note  Patient: Jaclyn Morgan  Procedure(s) Performed: Procedure(s): ARTERIOVENOUS (AV) FISTULA CREATION  (Right)  Patient Location: PACU  Anesthesia Type:General  Level of Consciousness: awake, alert  and oriented  Airway & Oxygen Therapy: Patient Spontanous Breathing and Patient connected to nasal cannula oxygen  Post-op Assessment: Report given to PACU RN and Post -op Vital signs reviewed and stable  Post vital signs: Reviewed and stable  Complications: No apparent anesthesia complications

## 2013-07-07 NOTE — Progress Notes (Signed)
Patient slept through the night. Patient has been NPO after midnight and is ready for surgery this morning. Consent has been signed by patient. Patient complains of no pain or discomfort. Will continue to monitor patient to end of shift.

## 2013-07-07 NOTE — Progress Notes (Signed)
Utilization Review Completed Camellia J. Wood, RN, BSN, NCM [phone]  

## 2013-07-07 NOTE — H&P (View-Only) (Signed)
Subjective: Interval History: none..   Objective: Vital signs in last 24 hours: Temp:  [97.3 F (36.3 C)-98.9 F (37.2 C)] 97.8 F (36.6 C) (06/24 1403) Pulse Rate:  [59-103] 59 (06/24 1403) Resp:  [16-20] 20 (06/24 1403) BP: (155-201)/(68-90) 169/84 mmHg (06/24 1403) SpO2:  [97 %-100 %] 100 % (06/24 1403) Weight:  [162 lb 14.7 oz (73.9 kg)-163 lb (73.936 kg)] 163 lb (73.936 kg) (06/24 0618)  Intake/Output from previous day: 06/23 0701 - 06/24 0700 In: 480 [P.O.:480] Out: 2501 [Stool:1] Intake/Output this shift:    2+ radial pulses bilaterally. Poor surface vein size  Lab Results:  Recent Labs  07/05/13 1319 07/06/13 0350  WBC 7.1 6.5  HGB 8.8* 9.1*  HCT 27.4* 28.9*  PLT 159 150   BMET  Recent Labs  07/05/13 1318 07/06/13 0350  NA 143 143  K 3.4* 3.0*  CL 103 104  CO2 25 27  GLUCOSE 101* 106*  BUN 21 9  CREATININE 4.09* 2.67*  CALCIUM 8.4 8.2*    Studies/Results: Dg Chest 2 View  07/01/2013   CLINICAL DATA:  Fall  EXAM: CHEST  2 VIEW  COMPARISON:  Radiograph 12/28/2012  FINDINGS: Cardiac silhouette is mildly enlarged. There is a Anum Palecek right pleural effusion. Concern for right lower lobe airspace disease additionally.  IMPRESSION: 1. Artesia Berkey right pleural effusion. 2. Right lobe atelectasis versus infiltrate.   Electronically Signed   By: Suzy Bouchard M.D.   On: 07/01/2013 22:40   Ct Head Wo Contrast  07/01/2013   CLINICAL DATA:  80 year old female with altered mental status following fall. Hypertension.  EXAM: CT HEAD WITHOUT CONTRAST  TECHNIQUE: Contiguous axial images were obtained from the base of the skull through the vertex without intravenous contrast.  COMPARISON:  None.  FINDINGS: Moderate chronic small-vessel white matter ischemic changes are noted.  No acute intracranial abnormalities are identified, including mass lesion or mass effect, hydrocephalus, extra-axial fluid collection, midline shift, hemorrhage, or acute infarction.  The visualized bony  calvarium is unremarkable.  Mild left forehead soft tissue swelling is.  IMPRESSION: No evidence of acute intracranial abnormality.  Mild left forehead soft tissue swelling without fracture.  Moderate chronic small-vessel white matter ischemic changes.   Electronically Signed   By: Hassan Rowan M.D.   On: 07/01/2013 21:50   Ir Fluoro Guide Cv Line Right  07/02/2013   INDICATION: 80 year old female with acute on chronic renal failure in need of urgent hemodialysis secondary to uremia. Placement of a tunneled HD catheter is warranted to facilitate urgent hemodialysis.  EXAM: TUNNELED CENTRAL VENOUS HEMODIALYSIS CATHETER PLACEMENT WITH ULTRASOUND AND FLUOROSCOPIC GUIDANCE  MEDICATIONS: 1 g vancomycin The IV antibiotic was given in an appropriate time interval prior to skin puncture.  CONTRAST:  None  ANESTHESIA/SEDATION: Versed 2 mg IV; Fentanyl 50 mcg IV  Total Moderate Sedation Time  Fifteen minutes.  FLUOROSCOPY TIME:  18 seconds  COMPLICATIONS: None immediate  PROCEDURE: Informed written consent was obtained from the patient's daughter after a discussion of the risks, benefits, and alternatives to treatment. Questions regarding the procedure were encouraged and answered. The right neck and chest were prepped with chlorhexidine in a sterile fashion, and a sterile drape was applied covering the operative field. Maximum barrier sterile technique with sterile gowns and gloves were used for the procedure. A timeout was performed prior to the initiation of the procedure.  After creating a small venotomy incision, a micropuncture kit was utilized to access the right internal jugular vein under direct, real-time ultrasound guidance  after the overlying soft tissues were anesthetized with 1% lidocaine with epinephrine. Ultrasound image documentation was performed. The microwire was kinked to measure appropriate catheter length. A stiff glidewire was advanced to the level of the IVC and the micropuncture sheath was exchanged  for a peel-away sheath. A Bard hemo split tunneled hemodialysis catheter measuring 19 cm from tip to cuff was tunneled in a retrograde fashion from the anterior chest wall to the venotomy incision.  The catheter was then placed through the peel-away sheath with tips ultimately positioned within the superior aspect of the right atrium. Final catheter positioning was confirmed and documented with a spot radiographic image. The catheter aspirates and flushes normally. The catheter was flushed with appropriate volume heparin dwells.  The catheter exit site was secured with a 0-Prolene retention suture. The venotomy incision was closed with an interrupted 4-0 Vicryl, Dermabond and Steri-strips. Dressings were applied. The patient tolerated the procedure well without immediate post procedural complication.  IMPRESSION: Successful placement of 19 cm tip to cuff tunneled hemodialysis catheter via the right internal jugular vein with tips terminating within the superior aspect of the right atrium. The catheter is ready for immediate use.  Signed,  Criselda Peaches, MD  [facility]  [facility]   Electronically Signed   By: Jacqulynn Cadet M.D.   On: 07/02/2013 12:02   Ir US Guide Vasc Access Right  07/02/2013   INDICATION: 80 year old female with acute on chronic renal failure in need of urgent hemodialysis secondary to uremia. Placement of a tunneled HD catheter is warranted to facilitate urgent hemodialysis.  EXAM: TUNNELED CENTRAL VENOUS HEMODIALYSIS CATHETER PLACEMENT WITH ULTRASOUND AND FLUOROSCOPIC GUIDANCE  MEDICATIONS: 1 g vancomycin The IV antibiotic was given in an appropriate time interval prior to skin puncture.  CONTRAST:  None  ANESTHESIA/SEDATION: Versed 2 mg IV; Fentanyl 50 mcg IV  Total Moderate Sedation Time  Fifteen minutes.  FLUOROSCOPY TIME:  18 seconds  COMPLICATIONS: None immediate  PROCEDURE: Informed written consent was obtained from the  patient's daughter after a discussion of the risks, benefits, and alternatives to treatment. Questions regarding the procedure were encouraged and answered. The right neck and chest were prepped with chlorhexidine in a sterile fashion, and a sterile drape was applied covering the operative field. Maximum barrier sterile technique with sterile gowns and gloves were used for the procedure. A timeout was performed prior to the initiation of the procedure.  After creating a small venotomy incision, a micropuncture kit was utilized to access the right internal jugular vein under direct, real-time ultrasound guidance after the overlying soft tissues were anesthetized with 1% lidocaine with epinephrine. Ultrasound image documentation was performed. The microwire was kinked to measure appropriate catheter length. A stiff glidewire was advanced to the level of the IVC and the micropuncture sheath was exchanged for a peel-away sheath. A Bard hemo split tunneled hemodialysis catheter measuring 19 cm from tip to cuff was tunneled in a retrograde fashion from the anterior chest wall to the venotomy incision.  The catheter was then placed through the peel-away sheath with tips ultimately positioned within the superior aspect of the right atrium. Final catheter positioning was confirmed and documented with a spot radiographic image. The catheter aspirates and flushes normally. The catheter was flushed with appropriate volume heparin dwells.  The catheter exit site was secured with a 0-Prolene retention suture. The venotomy incision was closed with an interrupted 4-0 Vicryl, Dermabond and Steri-strips. Dressings were applied. The patient tolerated the procedure  well without immediate post procedural complication.  IMPRESSION: Successful placement of 19 cm tip to cuff tunneled hemodialysis catheter via the right internal jugular vein with tips terminating within the superior aspect of the right atrium. The catheter is ready for  immediate use.  Signed,  Criselda Peaches, MD  [facility]  [facility]   Electronically Signed   By: Jacqulynn Cadet M.D.   On: 07/02/2013 12:02   Dg Chest Portable 1 View  07/02/2013   CLINICAL DATA:  Left central line placement.  EXAM: PORTABLE CHEST - 1 VIEW  COMPARISON:  Chest radiograph from 07/01/2013  FINDINGS: A left IJ line is seen ending about the distal SVC.  The small right-sided pleural effusion has improved. Mild residual right basilar airspace opacity is seen. Mild vascular congestion is noted. No pneumothorax is identified.  The cardiomediastinal silhouette is borderline enlarged. No acute osseous abnormalities are seen.  IMPRESSION: 1. Left IJ line seen ending about the distal SVC. 2. Small right-sided pleural effusion has improved. Mild residual right basilar airspace opacity may reflect atelectasis or pneumonia. Mild vascular congestion and borderline cardiomegaly.   Electronically Signed   By: Garald Balding M.D.   On: 07/02/2013 05:03   Ct Maxillofacial Wo Cm  07/01/2013   CLINICAL DATA:  Fall, left black eye.  EXAM: CT MAXILLOFACIAL WITHOUT CONTRAST  TECHNIQUE: Multidetector CT imaging of the maxillofacial structures was performed. Multiplanar CT image reconstructions were also generated. A small metallic BB was placed on the right temple in order to reliably differentiate right from left.  COMPARISON:  None.  FINDINGS: Left periorbital/supra or soft tissue swelling with contusion is present. Hyperdensity within the left anterior chamber itself may represent blood. The globes are intact without evidence of acute injury. No retro-orbital hematoma or other pathology. Intraconal and extraconal fat are well well preserved.  The bony orbits are intact without evidence of orbital floor fracture. Zygomatic arches are intact.  Maxilla is intact. No nasal bone fracture. Mandible is intact. Mandibular condyles are normally positioned within the  temporomandibular fossa.  Paranasal sinuses are well pneumatized and free of fluid. No mastoid effusion.  A advanced atrophy noted within the partially visualized right  IMPRESSION: 1. Left periorbital/supraorbital contusion. 2. Intact globes. Hyperdensity within the left anterior chamber suggestive of hemorrhage. Correlation with physical recommended. No retro-orbital hematoma or other pathology. 3. No acute maxillofacial fracture.   Electronically Signed   By: Jeannine Boga M.D.   On: 07/01/2013 22:33   Anti-infectives: Anti-infectives   Start     Dose/Rate Route Frequency Ordered Stop   07/03/13 2200  cefTRIAXone (ROCEPHIN) 1 g in dextrose 5 % 50 mL IVPB  Status:  Discontinued     1 g 100 mL/hr over 30 Minutes Intravenous Every 24 hours 07/02/13 0007 07/02/13 0743   07/03/13 2200  azithromycin (ZITHROMAX) 500 mg in dextrose 5 % 250 mL IVPB  Status:  Discontinued     500 mg 250 mL/hr over 60 Minutes Intravenous Every 24 hours 07/02/13 0007 07/02/13 0743   07/02/13 1115  ceFAZolin (ANCEF) IVPB 2 g/50 mL premix  Status:  Discontinued     2 g 100 mL/hr over 30 Minutes Intravenous  Once 07/02/13 1107 07/02/13 1108   07/02/13 1115  vancomycin (VANCOCIN) IVPB 1000 mg/200 mL premix     1,000 mg 200 mL/hr over 60 Minutes Intravenous  Once 07/02/13 1108 07/02/13 1222   07/02/13 1104  ceFAZolin (ANCEF) 2-3 GM-% IVPB SOLR  Status:  Discontinued    Comments:  Nelly Laurence   : cabinet override      07/02/13 1104 07/02/13 1150   07/01/13 2345  cefTRIAXone (ROCEPHIN) 1 g in dextrose 5 % 50 mL IVPB     1 g 100 mL/hr over 30 Minutes Intravenous  Once 07/01/13 2331 07/02/13 0200   07/01/13 2345  azithromycin (ZITHROMAX) 500 mg in dextrose 5 % 250 mL IVPB     500 mg 250 mL/hr over 60 Minutes Intravenous  Once 07/01/13 2331 07/02/13 0327      Assessment/Plan: s/p * No surgery found * Vein map reviewed and discussed with patient. Multiple IVs and left arm with right arm being saved. The neck  shows marginal size of basilic vein in the right arm. We'll plan for AV graft versus AV fistula depending on operative findings. Discussed this at length with the patient regarding her need for long-term hemodialysis access. Plan for surgery tomorrow with Dr. Kellie Simmering   LOS: 5 days   EARLY, TODD 07/06/2013, 4:16 PM

## 2013-07-08 ENCOUNTER — Encounter (HOSPITAL_COMMUNITY): Payer: Self-pay | Admitting: Vascular Surgery

## 2013-07-08 LAB — CBC
HEMATOCRIT: 29.7 % — AB (ref 36.0–46.0)
Hemoglobin: 9.1 g/dL — ABNORMAL LOW (ref 12.0–15.0)
MCH: 26.9 pg (ref 26.0–34.0)
MCHC: 30.6 g/dL (ref 30.0–36.0)
MCV: 87.9 fL (ref 78.0–100.0)
Platelets: 176 10*3/uL (ref 150–400)
RBC: 3.38 MIL/uL — ABNORMAL LOW (ref 3.87–5.11)
RDW: 14.4 % (ref 11.5–15.5)
WBC: 8.2 10*3/uL (ref 4.0–10.5)

## 2013-07-08 MED ORDER — CLONIDINE HCL 0.2 MG PO TABS: 0.2000 mg | ORAL_TABLET | Freq: Two times a day (BID) | ORAL | Status: DC

## 2013-07-08 NOTE — Progress Notes (Signed)
Pt being discharged to [place]. Called report to nurse at [facility]. Spoke with daughter [name], to notify her of the pick-up time for pt. Which is 1:30.

## 2013-07-08 NOTE — Evaluation (Signed)
Physical Therapy Evaluation Patient Details Name: Jaclyn Morgan MRN: [id number] DOB: Sep 16, 1932 Today's Date: 07/08/2013   History of Present Illness  Patient is a 80 yo female admitted 07/01/13 with uremia, renal failure, metabolic encephalopahy, NSTEMI, HTN.  Patient now on HD.  PMH: CKD, HTN, CHF, mult falls  Clinical Impression  Pt progressing towards physical therapy goals. Continues to require assist with directing walker and multiple cues for safety awareness. Will continue to follow, pt anticipating d/c today.     Follow Up Recommendations SNF;Supervision/Assistance - 24 hour    Equipment Recommendations  Wheelchair (measurements PT)    Recommendations for Other Services       Precautions / Restrictions Precautions Precautions: Fall Restrictions Weight Bearing Restrictions: No      Mobility  Bed Mobility Overal bed mobility: Needs Assistance Bed Mobility: Supine to Sit     Supine to sit: Mod assist     General bed mobility comments: Pt able to sit EOB with assist to scoot all the way out so feet were resting on floor. Frequent cueing required to complete task.  Transfers Overall transfer level: Needs assistance Equipment used: Rolling walker (2 wheeled) Transfers: Sit to/from Stand Sit to Stand: Min assist;+2 physical assistance         General transfer comment: VC's for hand placement on seated surface for safety. Able to power-up to full standing with +2 min assist. Small LOB back onto the bed after first transfer. Was able to maintain standing after.   Ambulation/Gait Ambulation/Gait assistance: Min assist Ambulation Distance (Feet): 100 Feet Assistive device: Rolling walker (2 wheeled) Gait Pattern/deviations: Step-through pattern;Decreased stride length;Shuffle;Trunk flexed Gait velocity: Decreased Gait velocity interpretation: Below normal speed for age/gender General Gait Details: VC's for walker position and direction. Pt drifting to the left  consistently throughout gait training.   Stairs            Wheelchair Mobility    Modified Rankin (Stroke Patients Only)       Balance Overall balance assessment: Needs assistance Sitting-balance support: Feet supported;No upper extremity supported Sitting balance-Leahy Scale: Fair     Standing balance support: Bilateral upper extremity supported Standing balance-Leahy Scale: Poor                               Pertinent Vitals/Pain Vitals stable throughout session.     Home Living                        Prior Function                 Hand Dominance        Extremity/Trunk Assessment                         Communication      Cognition Arousal/Alertness: Awake/alert Behavior During Therapy: WFL for tasks assessed/performed Overall Cognitive Status: Impaired/Different from baseline                 General Comments: Pleasantly confused. Repeating questions throughout session.     General Comments      Exercises        Assessment/Plan    PT Assessment    PT Diagnosis     PT Problem List    PT Treatment Interventions     PT Goals (Current goals can be found in the Care Plan section) Acute Rehab PT Goals  Patient Stated Goal: None stated PT Goal Formulation: With patient/family Time For Goal Achievement: 07/17/13 Potential to Achieve Goals: Fair    Frequency Min 3X/week   Barriers to discharge        Co-evaluation               End of Session Equipment Utilized During Treatment: Gait belt Activity Tolerance: Patient limited by fatigue Patient left: in chair;with chair alarm set;with call bell/phone within reach Nurse Communication: Mobility status         Time: 0912-0935 PT Time Calculation (min): 23 min   Charges:     PT Treatments $Gait Training: 8-22 mins $Therapeutic Activity: 8-22 mins   PT G Codes:          Jolyn Lent 07/08/2013, 9:46 AM  Jolyn Lent, PT,  DPT Acute Rehabilitation Services Pager: [phone]

## 2013-07-08 NOTE — Care Management Note (Signed)
    Page 1 of 1   07/08/2013     2:47:05 PM CARE MANAGEMENT NOTE 07/08/2013  Patient:  Jaclyn Morgan, Jaclyn Morgan   Account Number:  [id number]  Date Initiated:  07/08/2013  Documentation initiated by:  [facility]  Subjective/Objective Assessment:   80 y.o. female with a history of Stage IV CKD, and HTN who was brought to the ED by her daughter due to increased confusion worsening.//Home with daughter.     Action/Plan:   Now Amenable to starting Dialysis treatments.//Home with [facility]   Anticipated DC Date:  07/08/2013   Anticipated DC Plan:  [place] referral  Clinical Social Worker      DC Planning Services  CM consult      Choice offered to / List presented to:  C-4 Adult Children        [place] arranged  HH-1 RN  [place] [facility]      [place] agency  [place]   Status of service:  Completed, signed off Medicare Important Message given?  YES (If response is "NO", the following Medicare IM given date fields will be blank) Date Medicare IM given:  07/07/2013 Date Additional Medicare IM given:    Discharge Disposition:  [place]  Per UR Regulation:  Reviewed for med. necessity/level of care/duration of stay  If discussed at [place] of Stay Meetings, dates discussed:    Comments:  07/08/13 [place] Clydene Laming, RN, BSN, [employer] [phone] Spoke with pt at bedside regarding discharge planning for [facility]. Offered pt list of home health agencies to choose from.  Pt chose [place] to render services. Rhett Bannister of [facility] notified.  No DME needs identified at this time.

## 2013-07-08 NOTE — Discharge Summary (Signed)
Physician Discharge Summary  Jaclyn Morgan LKG:[id number] DOB: July 10, 1932 DOA: 07/01/2013  PCP: Scarlette Calico, MD  Admit date: 07/01/2013 Discharge date: 07/08/2013  Time spent: 40 minutes  Recommendations for Outpatient Follow-up:  1. Follow up with renal. Cont HD. titrate BP medications as tolerate it. 2. Follow up with PCP in 4 week.  Discharge Diagnoses:  Principal Problem:   Uremia Active Problems:   Unspecified hypothyroidism   Renal failure   Metabolic encephalopathy   CKD (chronic kidney disease), stage IV   Hypertensive urgency   CAP (community acquired pneumonia)   Acute diastolic CHF (congestive heart failure)   Anemia of renal disease   Hyperkalemia   Elevated troponin   Discharge Condition: stable  Diet recommendation:renal  Filed Weights   07/07/13 1422 07/07/13 1822 07/08/13 0501  Weight: 75.4 kg (166 lb 3.6 oz) 72.9 kg (160 lb 11.5 oz) 73.2 kg (161 lb 6 oz)    History of present illness:  80 y.o. female with a history of Stage IV CKD, and HTN who was brought to the ED by her daughter due to increased confusion worsening over the past 2-3 days. Patient tripped in her home 1 week ago and suffered a contusion to the Left orbit, and per the daughter she seemed to continue to get worse over the past week. She was advised in March of 2015 that she needed Dialysis treatments, but she refused at that time. In the ED, she was found to have a BUN/Cr = 81 / 7.96 with hyperkalemia of 5.9. She also had a Pro BNP = 70 K, and a Troponin = 0.65. She was referred for medical admission, and a cardiology consultation was placed.    Hospital Course:  ESRD/volume overload/hyperkalemia: - S/p HD catheter placement 6/20, HD started 6/20  - BP much improved. - VVS consulted, A/V fistule 6.24.2015.  - cont HD TTS as an outpatient. - will go to SNF.  Accelerated Hypertension:  - high, ? Due to pain. Narcotics.  - Cont. clonidine, labetalol  - Bp improved, will cont to titrate  medications as an outpatient.  Anemia of chronic disease:  - Drop in Hb, no overt blood loss  - Anemia panel c/w chronic disease  - Transfused 2 units PRBC on 6/21  - Aranesp during HD.   Uremic encephalopathy:  - improving with HD, suspect may have some baseline cognitive dysfunction too.   NSTEMI/Demand ischemia:  - No clinical symptoms of angina/EKG unremarkable  - Echo normal EF 55-60% with no RWMA's  - Per cards no need for further cardiac w/u   Fall/L orbital contusion:    Procedures:  Fistula  Consultations:  Renal  VVS  Discharge Exam: Filed Vitals:   07/08/13 0501  BP: 189/77  Pulse:   Temp: 98.2 F (36.8 C)  Resp:     General: in no acute distress Cardiovascular: RRR Respiratory: good air movement CTA B/L  Discharge Instructions You were cared for by a hospitalist during your hospital stay. If you have any questions about your discharge medications or the care you received while you were in the hospital after you are discharged, you can call the unit and asked to speak with the hospitalist on call if the hospitalist that took care of you is not available. Once you are discharged, your primary care physician will handle any further medical issues. Please note that NO REFILLS for any discharge medications will be authorized once you are discharged, as it is imperative that you return to your  primary care physician (or establish a relationship with a primary care physician if you do not have one) for your aftercare needs so that they can reassess your need for medications and monitor your lab values.      Discharge Instructions   Diet - low sodium heart healthy    Complete by:  As directed      Increase activity slowly    Complete by:  As directed             Medication List    STOP taking these medications       furosemide 40 MG tablet  Commonly known as:  LASIX      TAKE these medications       acetaminophen 500 MG tablet  Commonly known  as:  TYLENOL  Take 1,000 mg by mouth 2 (two) times daily as needed (pain).     BAYER ASPIRIN 325 MG tablet  Generic drug:  aspirin  Take 325 mg by mouth daily.     calcitRIOL 0.25 MCG capsule  Commonly known as:  ROCALTROL  Take 0.25 mcg by mouth every Monday, Wednesday, and Friday.     cloNIDine 0.2 MG tablet  Commonly known as:  CATAPRES  Take 1 tablet (0.2 mg total) by mouth 2 (two) times daily.     doxazosin 2 MG tablet  Commonly known as:  CARDURA  Take 4 mg by mouth 2 (two) times daily.     ferrous sulfate 325 (65 FE) MG tablet  Take 650 mg by mouth daily with breakfast.     labetalol 200 MG tablet  Commonly known as:  NORMODYNE  Take 200 mg by mouth 3 (three) times daily.     multivitamin with minerals Tabs tablet  Take 1 tablet by mouth daily.     rosuvastatin 20 MG tablet  Commonly known as:  CRESTOR  Take 20 mg by mouth every evening.       Allergies  Allergen Reactions  . Norvasc [Amlodipine Besylate] Nausea Only  . Zocor [Simvastatin] Other (See Comments)    Kidney Issues?   Follow-up Information   Follow up with [name], MD In 8 weeks. (Office will call you to arrange your appt (sent))    Specialty:  Vascular Surgery   Contact information:   [address] [place] [address] [phone]        The results of significant diagnostics from this hospitalization (including imaging, microbiology, ancillary and laboratory) are listed below for reference.    Significant Diagnostic Studies: Dg Chest 2 View  07/01/2013   CLINICAL DATA:  Fall  EXAM: CHEST  2 VIEW  COMPARISON:  Radiograph 12/28/2012  FINDINGS: Cardiac silhouette is mildly enlarged. There is a new right pleural effusion. Concern for right lower lobe airspace disease additionally.  IMPRESSION: 1. New right pleural effusion. 2. Right lobe atelectasis versus infiltrate.   Electronically Signed   By: Suzy Bouchard M.D.   On: 07/01/2013 22:40   Ct Head Wo Contrast  07/01/2013    CLINICAL DATA:  80 year old female with altered mental status following fall. Hypertension.  EXAM: CT HEAD WITHOUT CONTRAST  TECHNIQUE: Contiguous axial images were obtained from the base of the skull through the vertex without intravenous contrast.  COMPARISON:  None.  FINDINGS: Moderate chronic small-vessel white matter ischemic changes are noted.  No acute intracranial abnormalities are identified, including mass lesion or mass effect, hydrocephalus, extra-axial fluid collection, midline shift, hemorrhage, or acute infarction.  The visualized bony calvarium is  unremarkable.  Mild left forehead soft tissue swelling is.  IMPRESSION: No evidence of acute intracranial abnormality.  Mild left forehead soft tissue swelling without fracture.  Moderate chronic small-vessel white matter ischemic changes.   Electronically Signed   By: Hassan Rowan M.D.   On: 07/01/2013 21:50   Ir Fluoro Guide Cv Line Right  07/02/2013   INDICATION: 80 year old female with acute on chronic renal failure in need of urgent hemodialysis secondary to uremia. Placement of a tunneled HD catheter is warranted to facilitate urgent hemodialysis.  EXAM: TUNNELED CENTRAL VENOUS HEMODIALYSIS CATHETER PLACEMENT WITH ULTRASOUND AND FLUOROSCOPIC GUIDANCE  MEDICATIONS: 1 g vancomycin The IV antibiotic was given in an appropriate time interval prior to skin puncture.  CONTRAST:  None  ANESTHESIA/SEDATION: Versed 2 mg IV; Fentanyl 50 mcg IV  Total Moderate Sedation Time  Fifteen minutes.  FLUOROSCOPY TIME:  18 seconds  COMPLICATIONS: None immediate  PROCEDURE: Informed written consent was obtained from the patient's daughter after a discussion of the risks, benefits, and alternatives to treatment. Questions regarding the procedure were encouraged and answered. The right neck and chest were prepped with chlorhexidine in a sterile fashion, and a sterile drape was applied covering the operative field. Maximum barrier sterile technique with sterile gowns and  gloves were used for the procedure. A timeout was performed prior to the initiation of the procedure.  After creating a small venotomy incision, a micropuncture kit was utilized to access the right internal jugular vein under direct, real-time ultrasound guidance after the overlying soft tissues were anesthetized with 1% lidocaine with epinephrine. Ultrasound image documentation was performed. The microwire was kinked to measure appropriate catheter length. A stiff glidewire was advanced to the level of the IVC and the micropuncture sheath was exchanged for a peel-away sheath. A Bard hemo split tunneled hemodialysis catheter measuring 19 cm from tip to cuff was tunneled in a retrograde fashion from the anterior chest wall to the venotomy incision.  The catheter was then placed through the peel-away sheath with tips ultimately positioned within the superior aspect of the right atrium. Final catheter positioning was confirmed and documented with a spot radiographic image. The catheter aspirates and flushes normally. The catheter was flushed with appropriate volume heparin dwells.  The catheter exit site was secured with a 0-Prolene retention suture. The venotomy incision was closed with an interrupted 4-0 Vicryl, Dermabond and Steri-strips. Dressings were applied. The patient tolerated the procedure well without immediate post procedural complication.  IMPRESSION: Successful placement of 19 cm tip to cuff tunneled hemodialysis catheter via the right internal jugular vein with tips terminating within the superior aspect of the right atrium. The catheter is ready for immediate use.  Signed,  Criselda Peaches, MD  [facility]  [facility]   Electronically Signed   By: Jacqulynn Cadet M.D.   On: 07/02/2013 12:02   Ir US Guide Vasc Access Right  07/02/2013   INDICATION: 81 year old female with acute on chronic renal failure in need of urgent hemodialysis secondary to  uremia. Placement of a tunneled HD catheter is warranted to facilitate urgent hemodialysis.  EXAM: TUNNELED CENTRAL VENOUS HEMODIALYSIS CATHETER PLACEMENT WITH ULTRASOUND AND FLUOROSCOPIC GUIDANCE  MEDICATIONS: 1 g vancomycin The IV antibiotic was given in an appropriate time interval prior to skin puncture.  CONTRAST:  None  ANESTHESIA/SEDATION: Versed 2 mg IV; Fentanyl 50 mcg IV  Total Moderate Sedation Time  Fifteen minutes.  FLUOROSCOPY TIME:  18 seconds  COMPLICATIONS: None immediate  PROCEDURE: Informed  written consent was obtained from the patient's daughter after a discussion of the risks, benefits, and alternatives to treatment. Questions regarding the procedure were encouraged and answered. The right neck and chest were prepped with chlorhexidine in a sterile fashion, and a sterile drape was applied covering the operative field. Maximum barrier sterile technique with sterile gowns and gloves were used for the procedure. A timeout was performed prior to the initiation of the procedure.  After creating a small venotomy incision, a micropuncture kit was utilized to access the right internal jugular vein under direct, real-time ultrasound guidance after the overlying soft tissues were anesthetized with 1% lidocaine with epinephrine. Ultrasound image documentation was performed. The microwire was kinked to measure appropriate catheter length. A stiff glidewire was advanced to the level of the IVC and the micropuncture sheath was exchanged for a peel-away sheath. A Bard hemo split tunneled hemodialysis catheter measuring 19 cm from tip to cuff was tunneled in a retrograde fashion from the anterior chest wall to the venotomy incision.  The catheter was then placed through the peel-away sheath with tips ultimately positioned within the superior aspect of the right atrium. Final catheter positioning was confirmed and documented with a spot radiographic image. The catheter aspirates and flushes normally. The  catheter was flushed with appropriate volume heparin dwells.  The catheter exit site was secured with a 0-Prolene retention suture. The venotomy incision was closed with an interrupted 4-0 Vicryl, Dermabond and Steri-strips. Dressings were applied. The patient tolerated the procedure well without immediate post procedural complication.  IMPRESSION: Successful placement of 19 cm tip to cuff tunneled hemodialysis catheter via the right internal jugular vein with tips terminating within the superior aspect of the right atrium. The catheter is ready for immediate use.  Signed,  Criselda Peaches, MD  [facility]  [facility]   Electronically Signed   By: Jacqulynn Cadet M.D.   On: 07/02/2013 12:02   Dg Chest Portable 1 View  07/02/2013   CLINICAL DATA:  Left central line placement.  EXAM: PORTABLE CHEST - 1 VIEW  COMPARISON:  Chest radiograph from 07/01/2013  FINDINGS: A left IJ line is seen ending about the distal SVC.  The small right-sided pleural effusion has improved. Mild residual right basilar airspace opacity is seen. Mild vascular congestion is noted. No pneumothorax is identified.  The cardiomediastinal silhouette is borderline enlarged. No acute osseous abnormalities are seen.  IMPRESSION: 1. Left IJ line seen ending about the distal SVC. 2. Small right-sided pleural effusion has improved. Mild residual right basilar airspace opacity may reflect atelectasis or pneumonia. Mild vascular congestion and borderline cardiomegaly.   Electronically Signed   By: Garald Balding M.D.   On: 07/02/2013 05:03   Ct Maxillofacial Wo Cm  07/01/2013   CLINICAL DATA:  Fall, left black eye.  EXAM: CT MAXILLOFACIAL WITHOUT CONTRAST  TECHNIQUE: Multidetector CT imaging of the maxillofacial structures was performed. Multiplanar CT image reconstructions were also generated. A small metallic BB was placed on the right temple in order to reliably differentiate right from left.   COMPARISON:  None.  FINDINGS: Left periorbital/supra or soft tissue swelling with contusion is present. Hyperdensity within the left anterior chamber itself may represent blood. The globes are intact without evidence of acute injury. No retro-orbital hematoma or other pathology. Intraconal and extraconal fat are well well preserved.  The bony orbits are intact without evidence of orbital floor fracture. Zygomatic arches are intact.  Maxilla is intact. No nasal bone  fracture. Mandible is intact. Mandibular condyles are normally positioned within the temporomandibular fossa.  Paranasal sinuses are well pneumatized and free of fluid. No mastoid effusion.  A advanced atrophy noted within the partially visualized right  IMPRESSION: 1. Left periorbital/supraorbital contusion. 2. Intact globes. Hyperdensity within the left anterior chamber suggestive of hemorrhage. Correlation with physical recommended. No retro-orbital hematoma or other pathology. 3. No acute maxillofacial fracture.   Electronically Signed   By: Jeannine Boga M.D.   On: 07/01/2013 22:33    Microbiology: Recent Results (from the past 240 hour(s))  CULTURE, BLOOD (ROUTINE X 2)     Status: None   Collection Time    07/01/13 11:41 PM      Result Value Ref Range Status   Specimen Description BLOOD RIGHT ANTECUBITAL   Final   Special Requests BOTTLES DRAWN AEROBIC AND ANAEROBIC 5CC   Final   Culture  Setup Time     Final   Value: 07/02/2013 04:07     Performed at [facility]   Culture     Final   Value: STAPHYLOCOCCUS SPECIES (COAGULASE NEGATIVE)     Note: THE SIGNIFICANCE OF ISOLATING THIS ORGANISM FROM A SINGLE SET OF BLOOD CULTURES WHEN MULTIPLE SETS ARE DRAWN IS UNCERTAIN. PLEASE NOTIFY THE MICROBIOLOGY DEPARTMENT WITHIN ONE WEEK IF SPECIATION AND SENSITIVITIES ARE REQUIRED.     Note: Gram Stain Report Called to,Read Back By and Verified With: PEGGY GERHARD 07/03/13 @ 5:59PM BY RUSCOE A.     Performed at [facility]    Report Status 07/05/2013 FINAL   Final  CULTURE, BLOOD (ROUTINE X 2)     Status: None   Collection Time    07/01/13 11:51 PM      Result Value Ref Range Status   Specimen Description BLOOD RIGHT HAND   Final   Special Requests BOTTLES DRAWN AEROBIC ONLY 5CC   Final   Culture  Setup Time     Final   Value: 07/02/2013 04:08     Performed at [facility]   Culture     Final   Value:        BLOOD CULTURE RECEIVED NO GROWTH TO DATE CULTURE WILL BE HELD FOR 5 DAYS BEFORE ISSUING A FINAL NEGATIVE REPORT     Performed at [facility]   Report Status PENDING   Incomplete  MRSA PCR SCREENING     Status: Abnormal   Collection Time    07/02/13  5:15 AM      Result Value Ref Range Status   MRSA by PCR POSITIVE (*) NEGATIVE Final   Comment:            The GeneXpert MRSA Assay (FDA     approved for NASAL specimens     only), is one component of a     comprehensive MRSA colonization     surveillance program. It is not     intended to diagnose MRSA     infection nor to guide or     monitor treatment for     MRSA infections.     RESULT CALLED TO, READ BACK BY AND VERIFIED WITH:     MUHORO,D RN 07/02/13 0942 [place]  SURGICAL PCR SCREEN     Status: None   Collection Time    07/06/13 11:03 PM      Result Value Ref Range Status   MRSA, PCR NEGATIVE  NEGATIVE Final   Staphylococcus aureus NEGATIVE  NEGATIVE Final   Comment:  The Xpert SA Assay (FDA     approved for NASAL specimens     in patients over 6 years of age),     is one component of     a comprehensive surveillance     program.  Test performance has     been validated by [employer] for patients greater     than or equal to 38 year old.     It is not intended     to diagnose infection nor to     guide or monitor treatment.     Labs: Basic Metabolic Panel:  Recent Labs Lab 07/03/13 0500 07/04/13 0309 07/05/13 1318 07/06/13 0350 07/07/13 1419  NA 144 143 143 143 144  K 3.5* 3.3* 3.4*  3.0* 3.2*  CL 104 103 103 104 104  CO2 22 22 25 27 27   GLUCOSE 95 104* 101* 106* 122*  BUN 44* 45* 21 9 17   CREATININE 5.18* 5.89* 4.09* 2.67* 4.23*  CALCIUM 8.7 8.6 8.4 8.2* 8.3*  PHOS 3.9 3.7 2.8 1.4* 3.9   Liver Function Tests:  Recent Labs Lab 07/01/13 2055  07/03/13 0500 07/04/13 0309 07/05/13 1318 07/06/13 0350 07/07/13 1419  AST 29  --   --   --   --   --   --   ALT 9  --   --   --   --   --   --   ALKPHOS 61  --   --   --   --   --   --   BILITOT 0.8  --   --   --   --   --   --   PROT 6.5  --   --   --   --   --   --   ALBUMIN 2.9*  < > 2.4* 2.7* 2.6* 2.3* 2.4*  < > = values in this interval not displayed.  Recent Labs Lab 07/01/13 2055  LIPASE 29   No results found for this basename: AMMONIA,  in the last 168 hours CBC:  Recent Labs Lab 07/05/13 0409 07/05/13 1319 07/06/13 0350 07/07/13 0321 07/08/13 0453  WBC 7.2 7.1 6.5 7.3 8.2  HGB 8.8* 8.8* 9.1* 8.9* 9.1*  HCT 27.3* 27.4* 28.9* 28.4* 29.7*  MCV 84.5 85.4 88.1 87.1 87.9  PLT 151 159 150 163 176   Cardiac Enzymes:  Recent Labs Lab 07/01/13 2055 07/02/13 0819 07/02/13 1327  TROPONINI 0.65* 0.40* 0.36*   BNP: BNP (last 3 results)  Recent Labs  07/01/13 2055  PROBNP >70000.0*   CBG:  Recent Labs Lab 07/01/13 2048  GLUCAP 97       Signed:  FELIZ ORTIZ, ABRAHAM  [facility] 07/08/2013, 8:03 AM

## 2013-07-08 NOTE — Progress Notes (Signed)
  [facility] [place] Postoperative hemodialysis access   Date of Surgery:  07/07/13 Surgeon: Kellie Simmering  Subjective:  Patient has no complaints this morning. Denies any steal symptoms.   PHYSICAL EXAMINATION:  Filed Vitals:   07/08/13 0501  BP: 189/77  Pulse:   Temp: 98.2 F (36.8 C)  Resp:     Incision is clean dry and intact Hand grip is 5/5. Sensation in digits is intact. There is a palpable thrill  The fistula is palpable    ASSESSMENT/PLAN:  Jaclyn Morgan is a 80 y.o. year old female who is s/p right radiocephalic arteriovenous fistula creation POD 1  -AVF with palpable thrill  -pt does not have evidence of steal sx -f/u with Dr. Kellie Simmering in 4-6 weeks to check maturation of AVF -will sign off-call as needed.   Virgina Jock, PA-C [facility] [place] Pager: [phone]

## 2013-07-08 NOTE — Progress Notes (Signed)
Discharge orders given. Will have dialysis on Mon, wed, Friday. Completed discharge teaching with daughter and pt. Daughter verbalizes understanding.

## 2013-07-08 NOTE — Progress Notes (Signed)
BACKGROUND 80 y.o. year-old female with known advanced CKD5 d/t hypertensive nephrosclerosis, followed by Dr. Justin Mend.  On review office records was non-compliant with appts, cancelled vascular acces surgery with Dr. Oneida Alar in Dec 2014 and refused to start HD when advised in March 2015 by Dr. Justin Mend.  Was brought to the ED by her family because of uremic symptoms. She was found to have Morgan creatinine on 7.96, BUN 81, CO2 of 16, elevated troponin of 0.65, Hb 7.8, CXR with mild vascular congestion. CT brain (done b/o recent fall/confusion) no acute change  TDC was placed by IR on 07/02/13 and HD was initiated for ESRD  ASSESSMENT/RECOMMENDATIONS  1. ESRD - Pt/family agreed  and HD was initiated 6/20.  2nd  6/22, 3rd 6/23  Then 6/25. Has spot MWF at [place]-  AVF- s/p AVF 6/25.  Is OK for discharge to NH today and can wait until Monday for her next treatment.  2. Anemia - Initiated darbepoetin with HD 100 QSat. Iron stores adequate. S/p transfusion Sunday- hgb 9.1 3. Secondary hyperpara -  hectorol with HD. PTH 259. Phos/calc at goal no binders- phos actually low ,  replaced 6/24 4. Hypertension with hypertensive urgency - d/t failure to take BP meds at home.  improved. Clonidine/cardura/labetalol plus dialysis- will challenge EDW hopefully be able to wean meds at some point 5. Confusion/AMS - at least in part uremic. CT after recent fall no stroke or bleed.  improved overall but I think some dementia 6. NSTEMI - presume demand ischemia. Cards is following. Heparin stopped. Advised BB and ASA. Enzymes elev but flat. Echo 55-60% No arrythmia. No curther w/u advised by Dr. Johnsie Cancel. 7. HLD - statin 8. Orbital contusion post fall 9. Dispo- discharge today  Subjective:  Had HD late last night- removed 2 liters tolerated well- BP is all over the place  Now has spot at [facility] on MWF - can start there Monday   Objective Vital signs in last 24 hours: Filed Vitals:   07/07/13 1912 07/07/13 2123 07/07/13 2357 07/08/13  0501  BP: 180/90  181/88 189/77  Pulse: 118 76 73   Temp:  98 F (36.7 C)  98.2 F (36.8 C)  TempSrc:  Oral  Oral  Resp:  18    Height:      Weight:    73.2 kg (161 lb 6 oz)  SpO2:    96%   Weight change: 2.3 kg (5 lb 1.1 oz)  Intake/Output Summary (Last 24 hours) at 07/08/13 0936 Last data filed at 07/08/13 0910  Gross per 24 hour  Intake    530 ml  Output   2158 ml  Net  -1628 ml   Physical Exam:   Alert, but slightly confused TDC right chest dry dressing Lungs with decreased BS No rales S1S2 No S3 No rub Abd obese, soft, not tender Ext some edema - new right AVF with thrill  Recent Labs Lab 07/01/13 2055 07/02/13 1950 07/02/13 2000 07/03/13 0500 07/04/13 0309 07/05/13 1318 07/06/13 0350 07/07/13 1419  NA 142  --  144 144 143 143 143 144  K 5.9*  --  4.2 3.5* 3.3* 3.4* 3.0* 3.2*  CL 103  --  105 104 103 103 104 104  CO2 16*  --  19 22 22 25 27 27   GLUCOSE 104*  --  140* 95 104* 101* 106* 122*  BUN 81*  --  83* 44* 45* 21 9 17   CREATININE 7.96*  --  8.12* 5.18* 5.89* 4.09*  2.67* 4.23*  CALCIUM 9.5 8.8 9.1 8.7 8.6 8.4 8.2* 8.3*  PHOS  --   --  5.5* 3.9 3.7 2.8 1.4* 3.9    Recent Labs Lab 07/01/13 2055  07/05/13 1318 07/06/13 0350 07/07/13 1419  AST 29  --   --   --   --   ALT 9  --   --   --   --   ALKPHOS 61  --   --   --   --   BILITOT 0.8  --   --   --   --   PROT 6.5  --   --   --   --   ALBUMIN 2.9*  < > 2.6* 2.3* 2.4*  < > = values in this interval not displayed.  Recent Labs Lab 07/01/13 2055  LIPASE 29    Recent Labs Lab 07/05/13 1319 07/06/13 0350 07/07/13 0321 07/08/13 0453  WBC 7.1 6.5 7.3 8.2  HGB 8.8* 9.1* 8.9* 9.1*  HCT 27.4* 28.9* 28.4* 29.7*  MCV 85.4 88.1 87.1 87.9  PLT 159 150 163 176    Recent Labs Lab 07/01/13 2055 07/02/13 0819 07/02/13 1327  TROPONINI 0.65* 0.40* 0.36*    Recent Labs Lab 07/01/13 2048  GLUCAP 97       Recent Labs Lab 07/02/13 0819 07/02/13 1950  IRON 49 62  TIBC 157* 145*   FERRITIN 710*  --    Studies/Results: No results found. Medications:   . aspirin  325 mg Oral Daily  . atorvastatin  40 mg Oral q1800  . cloNIDine  0.2 mg Oral BID  . darbepoetin (ARANESP) injection - DIALYSIS  100 mcg Intravenous Q Sat-HD  . doxazosin  4 mg Oral BID  . doxercalciferol  1 mcg Intravenous Q T,Th,Sa-HD  . enoxaparin (LOVENOX) injection  30 mg Subcutaneous Q24H  . labetalol  200 mg Oral BID  . multivitamin with minerals  1 tablet Oral Daily   Jaclyn Morgan   07/08/2013, 9:36 AM

## 2013-07-08 NOTE — Progress Notes (Signed)
Pt's daughter has decided not to allow mother to go to [employer]. Ambulance here to discharge pt. Home.

## 2013-07-09 NOTE — Progress Notes (Signed)
Message left for Cephus Richer- [facility]) Case Manager that patient d/c'd home rather than SNF.  Patient was agreeable with returning home with her daughter.  Lorie Phenix. [place], [place]

## 2013-07-12 ENCOUNTER — Encounter (HOSPITAL_COMMUNITY): Payer: Self-pay | Admitting: Emergency Medicine

## 2013-07-12 ENCOUNTER — Emergency Department (HOSPITAL_COMMUNITY)
Admission: EM | Admit: 2013-07-12 | Discharge: 2013-07-13 | Disposition: A | Discharge status: Home / Self Care | Payer: Medicare HMO | Attending: Emergency Medicine | Admitting: Emergency Medicine

## 2013-07-12 DIAGNOSIS — Z992 Dependence on renal dialysis: Secondary | ICD-10-CM | POA: Insufficient documentation

## 2013-07-12 DIAGNOSIS — Z79899 Other long term (current) drug therapy: Secondary | ICD-10-CM | POA: Insufficient documentation

## 2013-07-12 DIAGNOSIS — E785 Hyperlipidemia, unspecified: Secondary | ICD-10-CM | POA: Insufficient documentation

## 2013-07-12 DIAGNOSIS — N186 End stage renal disease: Secondary | ICD-10-CM | POA: Insufficient documentation

## 2013-07-12 DIAGNOSIS — E039 Hypothyroidism, unspecified: Secondary | ICD-10-CM | POA: Insufficient documentation

## 2013-07-12 DIAGNOSIS — R5381 Other malaise: Secondary | ICD-10-CM | POA: Insufficient documentation

## 2013-07-12 DIAGNOSIS — R5383 Other fatigue: Principal | ICD-10-CM

## 2013-07-12 DIAGNOSIS — F29 Unspecified psychosis not due to a substance or known physiological condition: Secondary | ICD-10-CM | POA: Insufficient documentation

## 2013-07-12 DIAGNOSIS — R410 Disorientation, unspecified: Secondary | ICD-10-CM

## 2013-07-12 DIAGNOSIS — Z862 Personal history of diseases of the blood and blood-forming organs and certain disorders involving the immune mechanism: Secondary | ICD-10-CM | POA: Insufficient documentation

## 2013-07-12 DIAGNOSIS — R531 Weakness: Secondary | ICD-10-CM

## 2013-07-12 DIAGNOSIS — Z7982 Long term (current) use of aspirin: Secondary | ICD-10-CM | POA: Insufficient documentation

## 2013-07-12 DIAGNOSIS — F0390 Unspecified dementia without behavioral disturbance: Secondary | ICD-10-CM | POA: Insufficient documentation

## 2013-07-12 DIAGNOSIS — F411 Generalized anxiety disorder: Secondary | ICD-10-CM | POA: Insufficient documentation

## 2013-07-12 DIAGNOSIS — I120 Hypertensive chronic kidney disease with stage 5 chronic kidney disease or end stage renal disease: Secondary | ICD-10-CM | POA: Insufficient documentation

## 2013-07-12 NOTE — ED Notes (Addendum)
Per [facility] pt from home, family reports a gradual increase in generalized weakness starting earlier this am, exact time unknown, pt has a hx of dementia. CBG 140, BP 154/65, HR 65, 99% RA, 12 non remarkable. Pt is a dialysis, daughter reports pt is non compliant with at home medications. Pt has bilateral black eyes due to a fall several weeks ago. Pt goes to dialysis M/W/F and did complete dialysis on Monday. Family reports they are unable manage her at home and are requesting assistance with possible NH placement

## 2013-07-13 LAB — CBC WITH DIFFERENTIAL/PLATELET
Basophils Absolute: 0 10*3/uL (ref 0.0–0.1)
Basophils Relative: 0 % (ref 0–1)
Eosinophils Absolute: 0.3 10*3/uL (ref 0.0–0.7)
Eosinophils Relative: 4 % (ref 0–5)
HCT: 29.4 % — ABNORMAL LOW (ref 36.0–46.0)
HEMOGLOBIN: 9.2 g/dL — AB (ref 12.0–15.0)
LYMPHS ABS: 1.4 10*3/uL (ref 0.7–4.0)
LYMPHS PCT: 18 % (ref 12–46)
MCH: 27.2 pg (ref 26.0–34.0)
MCHC: 31.3 g/dL (ref 30.0–36.0)
MCV: 87 fL (ref 78.0–100.0)
Monocytes Absolute: 1.2 10*3/uL — ABNORMAL HIGH (ref 0.1–1.0)
Monocytes Relative: 15 % — ABNORMAL HIGH (ref 3–12)
NEUTROS ABS: 4.9 10*3/uL (ref 1.7–7.7)
NEUTROS PCT: 63 % (ref 43–77)
Platelets: 185 10*3/uL (ref 150–400)
RBC: 3.38 MIL/uL — AB (ref 3.87–5.11)
RDW: 15.3 % (ref 11.5–15.5)
WBC: 7.8 10*3/uL (ref 4.0–10.5)

## 2013-07-13 LAB — COMPREHENSIVE METABOLIC PANEL
ALK PHOS: 63 U/L (ref 39–117)
ALT: 12 U/L (ref 0–35)
AST: 28 U/L (ref 0–37)
Albumin: 2.5 g/dL — ABNORMAL LOW (ref 3.5–5.2)
BUN: 28 mg/dL — AB (ref 6–23)
CHLORIDE: 95 meq/L — AB (ref 96–112)
CO2: 22 meq/L (ref 19–32)
Calcium: 8.7 mg/dL (ref 8.4–10.5)
Creatinine, Ser: 5.44 mg/dL — ABNORMAL HIGH (ref 0.50–1.10)
GFR calc non Af Amer: 7 mL/min — ABNORMAL LOW (ref >90)
GFR, EST AFRICAN AMERICAN: 8 mL/min — AB (ref >90)
GLUCOSE: 117 mg/dL — AB (ref 70–99)
POTASSIUM: 4.2 meq/L (ref 3.7–5.3)
Sodium: 133 mEq/L — ABNORMAL LOW (ref 137–147)
Total Bilirubin: 0.2 mg/dL — ABNORMAL LOW (ref 0.3–1.2)
Total Protein: 5.8 g/dL — ABNORMAL LOW (ref 6.0–8.3)

## 2013-07-13 LAB — VITAMIN B12: VITAMIN B 12: 1474 pg/mL — AB (ref 211–911)

## 2013-07-13 LAB — IRON AND TIBC
IRON: 49 ug/dL (ref 42–135)
IRON: 62 ug/dL (ref 42–135)
Saturation Ratios: 31 % (ref 20–55)
Saturation Ratios: 43 % (ref 20–55)
TIBC: 157 ug/dL — ABNORMAL LOW (ref 250–470)
TIBC: 145 ug/dL — AB (ref 250–470)
UIBC: 108 ug/dL — AB (ref 125–400)
UIBC: 83 ug/dL — AB (ref 125–400)

## 2013-07-13 LAB — CULTURE, BLOOD (ROUTINE X 2)

## 2013-07-13 LAB — FERRITIN: Ferritin: 710 ng/mL — ABNORMAL HIGH (ref 10–291)

## 2013-07-13 LAB — FOLATE: FOLATE: 19.9 ng/mL

## 2013-07-13 NOTE — ED Provider Notes (Signed)
CSN: [id number]     Arrival date & time 07/12/13  2344 History   First MD Initiated Contact with Patient 07/13/13 0003     Chief Complaint  Patient presents with  . Weakness     (Consider location/radiation/quality/duration/timing/severity/associated sxs/prior Treatment) HPI 80 year old female presents to the emergency department from home via EMS.  Patient recently discharged home after admission for weakness and confusion.  During that admission, it was thought that she had an encephalopathic secondary to renal disease, she was started on dialysis.  Reading the notes, there is question of some underlying dementia as well.  Patient was transported from home and do to generalized weakness and confusion.  Patient is without complaints at this time.  I spoke with the patient's daughter reports that patient has not been and the tori for some time and is unable to followup with her primary care Dr.  She feels patient is more confused since starting dialysis and seems weaker.  Patient has home health and home PT scheduled, PT saw patient today and felt like she may need evaluation given her confusion.  They do not have a wheelchair at home.  They report patient wears depends Past Medical History  Diagnosis Date  . Anemia   . Hypertension   . Hyperlipidemia   . Chronic kidney disease   . Hypothyroidism   . End-stage renal disease (ESRD)   . Urinary incontinence, functional   . Anxiety    Past Surgical History  Procedure Laterality Date  . Cataract extraction  06/07/2010  . Transthoracic echocardiogram  10/2009    EF=>55%, mild conc LVH; LA mildly dilated; mild mitral annular calcif, mild MR; mild TR, RVSP 40-50mmHg; AV mildly sclerotic; mild pulm valve regurg; aortic root sclerosis/calcif   . Nm myocar perf wall motion  10/2009    dipyridamole; mild-mod ischemia in apical anterior, basal inferolateral, mid inferoalteral region; post-stress EF 66%; high risk   . Av fistula placement Right  07/07/2013    Procedure: ARTERIOVENOUS (AV) FISTULA CREATION ;  Surgeon: Mal Misty, MD;  Location: [facility] OR;  Service: Vascular;  Laterality: Right;   Family History  Problem Relation Age of Onset  . Hyperlipidemia Other   . Hypertension Other   . Kidney disease Other   . Diabetes Other   . Hypertension Mother   . Diabetes Daughter   . Hypertension Daughter   . Cancer Son   . Cancer Sister    History  Substance Use Topics  . Smoking status: Never Smoker   . Smokeless tobacco: Never Used  . Alcohol Use: No   OB History   Grav Para Term Preterm Abortions TAB SAB Ect Mult Living                 Review of Systems  Unable to perform ROS: Dementia      Allergies  Norvasc and Zocor  Home Medications   Prior to Admission medications   Medication Sig Start Date End Date Taking? Authorizing Provider  acetaminophen (TYLENOL) 500 MG tablet Take 1,000 mg by mouth 2 (two) times daily as needed (pain).   Yes Historical Provider, MD  aspirin (BAYER ASPIRIN) 325 MG tablet Take 325 mg by mouth daily.     Yes Historical Provider, MD  calcitRIOL (ROCALTROL) 0.25 MCG capsule Take 0.25 mcg by mouth every Monday, Wednesday, and Friday.    Yes Historical Provider, MD  cloNIDine (CATAPRES) 0.2 MG tablet Take 0.2 mg by mouth 2 (two) times daily.   Yes  Historical Provider, MD  doxazosin (CARDURA) 2 MG tablet Take 4 mg by mouth 2 (two) times daily.    Yes Historical Provider, MD  ferrous sulfate 325 (65 FE) MG tablet Take 650 mg by mouth daily with breakfast.   Yes Historical Provider, MD  labetalol (NORMODYNE) 200 MG tablet Take 200 mg by mouth 3 (three) times daily.    Yes Historical Provider, MD  Multiple Vitamin (MULTIVITAMIN WITH MINERALS) TABS tablet Take 1 tablet by mouth daily.   Yes Historical Provider, MD  rosuvastatin (CRESTOR) 20 MG tablet Take 20 mg by mouth every evening.   Yes Historical Provider, MD   BP 167/76  Pulse 76  Temp(Src) 97.9 F (36.6 C) (Oral)  Resp 26  SpO2  100% Physical Exam  Nursing note and vitals reviewed. Constitutional: She appears well-developed and well-nourished.  HENT:  Head: Normocephalic and atraumatic.  Nose: Nose normal.  Mouth/Throat: Oropharynx is clear and moist.  Eyes: Conjunctivae and EOM are normal. Pupils are equal, round, and reactive to light.  Neck: Normal range of motion. Neck supple. No JVD present. No tracheal deviation present. No thyromegaly present.  Cardiovascular: Normal rate, regular rhythm, normal heart sounds and intact distal pulses.  Exam reveals no gallop and no friction rub.   No murmur heard. Pulmonary/Chest: Effort normal and breath sounds normal. No stridor. No respiratory distress. She has no wheezes. She has no rales. She exhibits no tenderness.  Abdominal: Soft. Bowel sounds are normal. She exhibits no distension and no mass. There is no tenderness. There is no rebound and no guarding.  Musculoskeletal: Normal range of motion. She exhibits no edema and no tenderness.  Shunt site clean dry and intact her right wrist, thrill noted  Lymphadenopathy:    She has no cervical adenopathy.  Neurological: She is alert. No cranial nerve deficit. She exhibits normal muscle tone. Coordination normal.  Skin: Skin is warm and dry. No rash noted. No erythema. No pallor.  Psychiatric: She has a normal mood and affect. Her behavior is normal. Judgment and thought content normal.    ED Course  Procedures (including critical care time) Labs Review Labs Reviewed  CBC WITH DIFFERENTIAL - Abnormal; Notable for the following:    RBC 3.38 (*)    Hemoglobin 9.2 (*)    HCT 29.4 (*)    Monocytes Relative 15 (*)    Monocytes Absolute 1.2 (*)    All other components within normal limits  COMPREHENSIVE METABOLIC PANEL - Abnormal; Notable for the following:    Sodium 133 (*)    Chloride 95 (*)    Glucose, Bld 117 (*)    BUN 28 (*)    Creatinine, Ser 5.44 (*)    Total Protein 5.8 (*)    Albumin 2.5 (*)    Total  Bilirubin 0.2 (*)    GFR calc non Af Amer 7 (*)    GFR calc Af Amer 8 (*)    All other components within normal limits    Imaging Review No results found.   EKG Interpretation None      Date: 07/13/2013  Rate: 76  Rhythm: normal sinus rhythm and premature ventricular contractions (PVC)  QRS Axis: left  Intervals: normal  ST/T Wave abnormalities: normal  Conduction Disutrbances:none  Narrative Interpretation:   Old EKG Reviewed: changes noted   MDM   Final diagnoses:  Confusion  ESRD (end stage renal disease)  Weakness  Dementia, without behavioral disturbance    80 year old female with confusion and weakness.  Reading through her meds recent discharge appears to be her new baseline.  Discussed with her daughter that she had no indications for admission at this time.  It appears that they have good PT and home health scheduled at home.  Encouraged daughter to look into getting patient a wheelchair for better mobility.    Kalman Drape, MD 07/13/13 [phone]

## 2013-07-13 NOTE — Discharge Instructions (Signed)
Continue with physical therapy, may need wheelchair for mobility.  Patient appears to have some dementia, may need further workup through primary care doctor.  Continue dialysis.   Dementia Dementia is a general term for problems with brain function. A person with dementia has memory loss and a hard time with at least one other brain function such as thinking, speaking, or problem solving. Dementia can affect social functioning, how you do your job, your mood, or your personality. The changes may be hidden for a long time. The earliest forms of this disease are usually not detected by family or friends. Dementia can be:  Irreversible.  Potentially reversible.  Partially reversible.  Progressive. This means it can get worse over time. CAUSES  Irreversible dementia causes may include:  Degeneration of brain cells (Alzheimer's disease or lewy body dementia).  Multiple small strokes (vascular dementia).  Infection (chronic meningitis or Creutzfelt-Jakob disease).  Frontotemporal dementia. This affects younger people, age 84 to 1, compared to those who have Alzheimer's disease.  Dementia associated with other disorders like Parkinson's disease, Huntington's disease, or HIV-associated dementia. Potentially or partially reversible dementia causes may include:  Medicines.  Metabolic causes such as excessive alcohol intake, vitamin B12 deficiency, or thyroid disease.  Masses or pressure in the brain such as a tumor, blood clot, or hydrocephalus. SYMPTOMS  Symptoms are often hard to detect. Family members or coworkers may not notice them early in the disease process. Different people with dementia may have different symptoms. Symptoms can include:  A hard time with memory, especially recent memory. Long-term memory may not be impaired.  Asking the same question multiple times or forgetting something someone just said.  A hard time speaking your thoughts or finding certain words.  A  hard time solving problems or performing familiar tasks (such as how to use a telephone).  Sudden changes in mood.  Changes in personality, especially increasing moodiness or mistrust.  Depression.  A hard time understanding complex ideas that were never a problem in the past. DIAGNOSIS  There are no specific tests for dementia.   Your caregiver may recommend a thorough evaluation. This is because some forms of dementia can be reversible. The evaluation will likely include a physical exam and getting a detailed history from you and a family member. The history often gives the best clues and suggestions for a diagnosis.  Memory testing may be done. A detailed brain function evaluation called neuropsychologic testing may be helpful.  Lab tests and brain imaging (such as a CT scan or MRI scan) are sometimes important.  Sometimes observation and re-evaluation over time is very helpful. TREATMENT  Treatment depends on the cause.   If the problem is a vitamin deficiency, it may be helped or cured with supplements.  For dementias such as Alzheimer's disease, medicines are available to stabilize or slow the course of the disease. There are no cures for this type of dementia.  Your caregiver can help direct you to groups, organizations, and other caregivers to help with decisions in the care of you or your loved one. HOME CARE INSTRUCTIONS The care of individuals with dementia is varied and dependent upon the progression of the dementia. The following suggestions are intended for the person living with, or caring for, the person with dementia.  Create a safe environment.  Remove the locks on bathroom doors to prevent the person from accidentally locking himself or herself in.  Use childproof latches on kitchen cabinets and any place where cleaning supplies, chemicals,  or alcohol are kept.  Use childproof covers in unused electrical outlets.  Install childproof devices to keep doors and  windows secured.  Remove stove knobs or install safety knobs and an automatic shut-off on the stove.  Lower the temperature on water heaters.  Label medicines and keep them locked up.  Secure knives, lighters, matches, power tools, and guns, and keep these items out of reach.  Keep the house free from clutter. Remove rugs or anything that might contribute to a fall.  Remove objects that might break and hurt the person.  Make sure lighting is good, both inside and outside.  Install grab rails as needed.  Use a monitoring device to alert you to falls or other needs for help.  Reduce confusion.  Keep familiar objects and people around.  Use night lights or dim lights at night.  Label items or areas.  Use reminders, notes, or directions for daily activities or tasks.  Keep a simple, consistent routine for waking, meals, bathing, dressing, and bedtime.  Create a calm, quiet environment.  Place large clocks and calendars prominently.  Display emergency numbers and home address near all telephones.  Use cues to establish different times of the day. An example is to open curtains to let the natural light in during the day.   Use effective communication.  Choose simple words and short sentences.  Use a gentle, calm tone of voice.  Be careful not to interrupt.  If the person is struggling to find a word or communicate a thought, try to provide the word or thought.  Ask one question at a time. Allow the person ample time to answer questions. Repeat the question again if the person does not respond.  Reduce nighttime restlessness.  Provide a comfortable bed.  Have a consistent nighttime routine.  Ensure a regular walking or physical activity schedule. Involve the person in daily activities as much as possible.  Limit napping during the day.  Limit caffeine.  Attend social events that stimulate rather than overwhelm the senses.  Encourage good nutrition and  hydration.  Reduce distractions during meal times and snacks.  Avoid foods that are too hot or too cold.  Monitor chewing and swallowing ability.  Continue with routine vision, hearing, dental, and medical screenings.  Only give over-the-counter or prescription medicines as directed by the caregiver.  Monitor driving abilities. Do not allow the person to drive when safe driving is no longer possible.  Register with an identification program which could provide location assistance in the event of a missing person situation. SEEK MEDICAL CARE IF:   New behavioral problems start such as moodiness, aggressiveness, or seeing things that are not there (hallucinations).  Any new problem with brain function happens. This includes problems with balance, speech, or falling a lot.  Problems with swallowing develop.  Any symptoms of other illness happen. Small changes or worsening in any aspect of brain function can be a sign that the illness is getting worse. It can also be a sign of another medical illness such as infection. Seeing a caregiver right away is important. SEEK IMMEDIATE MEDICAL CARE IF:   A fever develops.  New or worsened confusion develops.  New or worsened sleepiness develops.  Staying awake becomes hard to do. Document Released: 06/25/2000 Document Revised: 03/24/2011 Document Reviewed: 05/27/2010 [facility] Patient Information 2015 [place], [place]. This information is not intended to replace advice given to you by your health care provider. Make sure you discuss any questions you have with  your health care provider.  Confusion Confusion is the inability to think with your usual speed or clarity. Confusion may come on quickly or slowly over time. How quickly the confusion comes on depends on the cause. Confusion can be due to any number of causes. CAUSES   Concussion, head injury, or head trauma.  Seizures.  Stroke.  Fever.  Senility.  Heightened emotional  states like rage or terror.  Mental illness in which the person loses the ability to determine what is real and what is not (hallucinations).  Infections.  Toxic effects from alcohol, drugs, or prescription medicines.  Dehydration and an imbalance of salts in the body (electrolytes).  Lack of sleep.  Low blood sugar (diabetes).  Low levels of oxygen (for example from chronic lung disorders).  Drug interactions or other medication side effects.  Nutritional deficiencies, especially niacin, thiamine, vitamin C, or vitamin B.  Sudden drop in body temperature (hypothermia).  Illness in the elderly. Constipation can result in confusion. An elderly person who is hospitalized may become confused due to change in daily routine. SYMPTOMS  People often describe their thinking as cloudy or unclear when they are confused. Confusion can also include feeling disoriented. That means you are unaware of where or who you are. You may also not know what the date or time is. If confused, you may also have difficulty paying attention, remembering and making decisions. Some people also act aggressively when they are confused.  DIAGNOSIS  The medical evaluation of confusion may include:  Blood and urine tests.  X-rays.  Brain and nervous system tests.  Analyzing your brain waves (electroencphalogram or EEG).  A special X-ray (MRI) of your head or other special studies. Your physician will ask questions such as:  Do you get days and nights mixed up?  Are you awake during regular sleep times?  Do you have trouble recognizing people?  Do you know where you are?  Do you know the date and time?  Does the confusion come and go?  Is the confusion quickly getting worse?  Has there been a recent illness?  Has there been a recent head injury?  Are you diabetic?  Do you have a lung disorder?  What medication are you taking?  Have you taken drugs or alcohol? TREATMENT  An admission to  the hospital may not be needed, but a confused person should not be left alone. Stay with a family member or friend until the confusion clears. Avoid alcohol, pain relievers or sedative drugs until you have fully recovered. Do not drive until your caregiver says it is okay. HOME CARE INSTRUCTIONS What family and friends can do:  To find out if someone is confused ask him or her their name, age, and the date. If the person is unsure or answers incorrectly, he or she is confused.  Always introduce yourself, no matter how well the person knows you.  Often remind the person of his or her location.  Place a calendar and clock near the confused person.  Talk about current events and plans for the day.  Try to keep the environment calm, quiet and peaceful.  Make sure the patient keeps follow up appointments with their physician. PREVENTION  Ways to prevent confusion:  Avoid alcohol.  Eat a balanced diet.  Get enough sleep.  Do not become isolated. Spend time with other people and make plans for your days.  Keep careful watch on your blood sugar levels if you are diabetic. [place]  CARE IF:   You develop severe headaches, repeated vomiting, seizures, blackouts or slurred speech.  There is increasing confusion, weakness, numbness, restlessness or personality changes.  You develop a loss of balance, have marked dizziness, feel uncoordinated or fall.  You have delusions, hallucinations or develop severe anxiety.  Your family members think you need to be rechecked. Document Released: 02/07/2004 Document Revised: 03/24/2011 Document Reviewed: 10/05/2007 [facility] Patient Information 2015 [place], [place]. This information is not intended to replace advice given to you by your health care provider. Make sure you discuss any questions you have with your health care provider.  Dialysis Dialysis is a procedure that replaces some of the work healthy kidneys do. It is done when  you lose about 85-90% of your kidney function. It may also be done earlier if your symptoms may be improved by dialysis. During dialysis, wastes, salt, and extra water are removed from the blood, and the levels of certain chemicals in the blood (such as potassium) are maintained. Dialysis is done in sessions. Dialysis sessions are continued until the kidneys get better. If the kidneys cannot get better, such as in end-stage kidney disease, dialysis is continued for life or until you receive a new kidney (kidney transplant). There are two types of dialysis: hemodialysis and peritoneal dialysis. HEMODIALYSIS  Hemodialysis is a type of dialysis in which a machine called a dialyzer is used to filter the blood. Before beginning hemodialysis, you will have surgery to create a site where blood can be removed from the body and returned to the body (vascular access). There are three types of vascular accesses:  Arteriovenous fistula. To create this type of access, an artery is connected to a vein (usually in the arm). A fistula takes 1-6 months to develop after surgery. If it develops properly, it usually lasts longer than the other types of vascular accesses. It is also less likely to become infected and cause blood clots.  Arteriovenous graft. To create this type of access, an artery and a vein in the arm are connected with a tube. A graft may be used within 2-3 weeks of surgery.  A venous catheter. To create this type of access, a thin, flexible tube (catheter) is placed in a large vein in your neck, chest, or groin. A catheter may be used right away. It is usually used as a temporary access when dialysis needs to begin immediately. During hemodialysis, blood leaves the body through your access. It travels through a tube to the dialyzer, where it is filtered. The blood then returns to your body through another tube. Hemodialysis is usually performed by a caregiver at a hospital or dialysis center three times a  week. Visits last about 3-4 hours. It may also be performed with the help of another person at home with training.  PERITONEAL DIALYSIS Peritoneal dialysis is a type of dialysis in which the thin lining of the abdomen (peritoneum) is used as a filter. Before beginning peritoneal dialysis, you will have surgery to place a catheter in your abdomen. The catheter will be used to transfer a fluid called dialysate to and from your abdomen. At the start of a session, your abdomen is filled with dialysate. During the session, wastes, salt, and extra water in the blood pass through the peritoneum and into the dialysate. The dialysate is drained from the body at the end of the session. The process of filling and draining the dialysate is called an exchange. Exchanges are repeated until you have used up all  the dialysate for the day. Peritoneal dialysis may be performed by you at home or at almost any other location. It is done every day. You may need up to five exchanges a day. The amount of time the dialysate is in your body between exchanges is called a dwell. The dwell depends on the number of exchanges needed and the characteristics of the peritoneum. It usually varies from 1.5-3 hours. You may go about your day normally between exchanges. Alternately, the exchanges may be done at night while you sleep using a machine called a cycler. CHOOSING HEMODIALYSIS OR PERITONEAL DIALYSIS  Both hemodialysis and peritoneal dialysis have advantages and disadvantages. Talk to your caregiver about which type of dialysis would be best for you. Your lifestyle and preferences should be considered along with your medical condition. In some cases, only one type of dialysis may be an option.  Advantages of hemodialysis  It is done less often than peritoneal dialysis.  Someone else can do the dialysis for you.  If you go to a dialysis center, your caregiver will be able to recognize any problems right away.  If you go to a  dialysis center, you can interact with others who are having dialysis. This can provide you with emotional support. Disadvantages of hemodialysis  Hemodialysis may cause cramps and low blood pressure. It may leave you feeling tired on the days you have the treatment.  If you go to a dialysis center, you will need to make weekly appointments and work around the center's schedule.  You will need to take extra care when traveling. If you go to a dialysis center, you will need to make special arrangements to visit a dialysis center near your destination. If you are having treatments at home, you will need to take the dialyzer with you to your destination.  You will need to avoid more foods than you would need to avoid on peritoneal dialysis. Advantages of peritoneal dialysis  It is less likely than hemodialysis to cause cramps and low blood pressure.  You may do exchanges on your own wherever you are, including when you travel.  You do not need to avoid as many foods as you do on hemodialysis. Disadvantages of Peritoneal Dialysis  It is done more often than hemodialysis.  Performing peritoneal dialysis requires you to have dexterity of the hands. You must also be able to lift bags.  You will have to learn sterilization techniques. You will need to practice them every day to reduce the risk of infection. DIALYSIS DIET Both hemodialysis and peritoneal dialysis require you to make some changes to your diet. For example, you will need to limit your intake of foods high in the minerals phosphorus and potassium. You will also need to limit your fluid intake. Your dietitian can help you plan meals. A good meal plan can improve your dialysis and your health.  WHAT TO EXPECT  Adjusting to the dialysis treatment, schedule, and diet can take some time. You may need to stop working and may not be able to do some of the things you normally do. You may feel anxious or depressed when beginning dialysis.  Eventually, many people feel better overall because of dialysis. Some people are able to return to work after making some changes, such as reducing work intensity. FOR MORE INFORMATION  National Kidney Foundation: www.kidney.org American Association of Kidney Patients: BombTimer.gl  American Kid[employer] Fund: www.kidneyfund.org Document Released: 03/22/2002 Document Revised: 09/01/2012 Document Reviewed: 02/24/2012 [facility] Patient Information 2015 [place], [place].  This information is not intended to replace advice given to you by your health care provider. Make sure you discuss any questions you have with your health care provider.  Weakness Weakness is a lack of strength. You may feel weak all over your body or just in one part of your body. Weakness can be serious. In some cases, you may need more medical tests. HOME [place] a well-balanced diet.  Try to exercise every day.  Only take medicines as told by your doctor. GET HELP RIGHT AWAY IF:   You cannot do your normal daily activities.  You cannot walk up and down stairs, or you feel very tired when you do so.  You have shortness of breath or chest pain.  You have trouble moving parts of your body.  You have weakness in only one body part or on only one side of the body.  You have a fever.  You have trouble speaking or swallowing.  You cannot control when you pee (urinate) or poop (bowel movement).  You have black or bloody throw up (vomit) or poop.  Your weakness gets worse or spreads to other body parts.  You have new aches or pains. MAKE SURE YOU:   Understand these instructions.  Will watch your condition.  Will get help right away if you are not doing well or get worse. Document Released: 12/13/2007 Document Revised: 07/01/2011 Document Reviewed: 02/28/2011 [facility] Patient Information 2015 [place], [place]. This information is not intended to replace advice given to you by your health care provider. Make  sure you discuss any questions you have with your health care provider.

## 2013-07-13 NOTE — ED Notes (Signed)
Pt. arrived to transport pt. back home .

## 2013-07-13 NOTE — ED Notes (Signed)
Dr. Sharol Given explained results of tests and discharge plan to pt.'s family , will call [facility] to transport pt. home .

## 2013-07-14 ENCOUNTER — Ambulatory Visit: Payer: Commercial Managed Care - HMO | Admitting: Internal Medicine

## 2013-07-18 DIAGNOSIS — I5031 Acute diastolic (congestive) heart failure: Secondary | ICD-10-CM

## 2013-07-18 DIAGNOSIS — I120 Hypertensive chronic kidney disease with stage 5 chronic kidney disease or end stage renal disease: Secondary | ICD-10-CM

## 2013-07-18 DIAGNOSIS — I509 Heart failure, unspecified: Secondary | ICD-10-CM

## 2013-07-18 DIAGNOSIS — N186 End stage renal disease: Secondary | ICD-10-CM

## 2013-07-25 ENCOUNTER — Other Ambulatory Visit: Payer: Self-pay

## 2013-07-25 ENCOUNTER — Inpatient Hospital Stay (HOSPITAL_COMMUNITY)
Admission: EM | Admit: 2013-07-25 | Discharge: 2013-08-01 | DRG: 884 | Disposition: A | Discharge status: Skilled Nursing Facility | Payer: Medicare HMO | Attending: Internal Medicine | Admitting: Internal Medicine

## 2013-07-25 ENCOUNTER — Emergency Department (HOSPITAL_COMMUNITY): Payer: Medicare HMO

## 2013-07-25 DIAGNOSIS — I509 Heart failure, unspecified: Secondary | ICD-10-CM | POA: Diagnosis present

## 2013-07-25 DIAGNOSIS — I160 Hypertensive urgency: Secondary | ICD-10-CM

## 2013-07-25 DIAGNOSIS — I120 Hypertensive chronic kidney disease with stage 5 chronic kidney disease or end stage renal disease: Secondary | ICD-10-CM | POA: Diagnosis present

## 2013-07-25 DIAGNOSIS — N039 Chronic nephritic syndrome with unspecified morphologic changes: Secondary | ICD-10-CM

## 2013-07-25 DIAGNOSIS — F0390 Unspecified dementia without behavioral disturbance: Secondary | ICD-10-CM | POA: Diagnosis not present

## 2013-07-25 DIAGNOSIS — N184 Chronic kidney disease, stage 4 (severe): Secondary | ICD-10-CM

## 2013-07-25 DIAGNOSIS — A498 Other bacterial infections of unspecified site: Secondary | ICD-10-CM | POA: Diagnosis present

## 2013-07-25 DIAGNOSIS — E039 Hypothyroidism, unspecified: Secondary | ICD-10-CM | POA: Diagnosis present

## 2013-07-25 DIAGNOSIS — B029 Zoster without complications: Secondary | ICD-10-CM | POA: Diagnosis present

## 2013-07-25 DIAGNOSIS — G9341 Metabolic encephalopathy: Secondary | ICD-10-CM | POA: Diagnosis present

## 2013-07-25 DIAGNOSIS — Z7982 Long term (current) use of aspirin: Secondary | ICD-10-CM

## 2013-07-25 DIAGNOSIS — R5381 Other malaise: Secondary | ICD-10-CM | POA: Diagnosis not present

## 2013-07-25 DIAGNOSIS — F411 Generalized anxiety disorder: Secondary | ICD-10-CM | POA: Diagnosis present

## 2013-07-25 DIAGNOSIS — I10 Essential (primary) hypertension: Secondary | ICD-10-CM

## 2013-07-25 DIAGNOSIS — E785 Hyperlipidemia, unspecified: Secondary | ICD-10-CM | POA: Diagnosis present

## 2013-07-25 DIAGNOSIS — N390 Urinary tract infection, site not specified: Secondary | ICD-10-CM

## 2013-07-25 DIAGNOSIS — R531 Weakness: Secondary | ICD-10-CM | POA: Diagnosis present

## 2013-07-25 DIAGNOSIS — Z992 Dependence on renal dialysis: Secondary | ICD-10-CM

## 2013-07-25 DIAGNOSIS — F29 Unspecified psychosis not due to a substance or known physiological condition: Secondary | ICD-10-CM | POA: Diagnosis present

## 2013-07-25 DIAGNOSIS — N186 End stage renal disease: Secondary | ICD-10-CM | POA: Diagnosis present

## 2013-07-25 DIAGNOSIS — D631 Anemia in chronic kidney disease: Secondary | ICD-10-CM | POA: Diagnosis present

## 2013-07-25 DIAGNOSIS — R0600 Dyspnea, unspecified: Secondary | ICD-10-CM

## 2013-07-25 DIAGNOSIS — F05 Delirium due to known physiological condition: Secondary | ICD-10-CM | POA: Diagnosis present

## 2013-07-25 DIAGNOSIS — I5032 Chronic diastolic (congestive) heart failure: Secondary | ICD-10-CM | POA: Diagnosis present

## 2013-07-25 DIAGNOSIS — R627 Adult failure to thrive: Secondary | ICD-10-CM | POA: Diagnosis present

## 2013-07-25 DIAGNOSIS — J189 Pneumonia, unspecified organism: Secondary | ICD-10-CM

## 2013-07-25 DIAGNOSIS — N2581 Secondary hyperparathyroidism of renal origin: Secondary | ICD-10-CM | POA: Diagnosis present

## 2013-07-25 DIAGNOSIS — Z79899 Other long term (current) drug therapy: Secondary | ICD-10-CM

## 2013-07-25 LAB — CBC WITH DIFFERENTIAL/PLATELET
BASOS ABS: 0 10*3/uL (ref 0.0–0.1)
BASOS PCT: 0 % (ref 0–1)
EOS PCT: 1 % (ref 0–5)
Eosinophils Absolute: 0.1 10*3/uL (ref 0.0–0.7)
HCT: 26.9 % — ABNORMAL LOW (ref 36.0–46.0)
Hemoglobin: 8.5 g/dL — ABNORMAL LOW (ref 12.0–15.0)
LYMPHS PCT: 12 % (ref 12–46)
Lymphs Abs: 0.9 10*3/uL (ref 0.7–4.0)
MCH: 26.5 pg (ref 26.0–34.0)
MCHC: 31.6 g/dL (ref 30.0–36.0)
MCV: 83.8 fL (ref 78.0–100.0)
Monocytes Absolute: 0.9 10*3/uL (ref 0.1–1.0)
Monocytes Relative: 11 % (ref 3–12)
Neutro Abs: 5.7 10*3/uL (ref 1.7–7.7)
Neutrophils Relative %: 76 % (ref 43–77)
Platelets: 223 10*3/uL (ref 150–400)
RBC: 3.21 MIL/uL — ABNORMAL LOW (ref 3.87–5.11)
RDW: 14.4 % (ref 11.5–15.5)
WBC: 7.6 10*3/uL (ref 4.0–10.5)

## 2013-07-25 LAB — CBC
HCT: 26.6 % — ABNORMAL LOW (ref 36.0–46.0)
Hemoglobin: 8.4 g/dL — ABNORMAL LOW (ref 12.0–15.0)
MCH: 26.8 pg (ref 26.0–34.0)
MCHC: 31.6 g/dL (ref 30.0–36.0)
MCV: 85 fL (ref 78.0–100.0)
PLATELETS: 202 10*3/uL (ref 150–400)
RBC: 3.13 MIL/uL — ABNORMAL LOW (ref 3.87–5.11)
RDW: 14.6 % (ref 11.5–15.5)
WBC: 6 10*3/uL (ref 4.0–10.5)

## 2013-07-25 LAB — BASIC METABOLIC PANEL
ANION GAP: 18 — AB (ref 5–15)
BUN: 40 mg/dL — ABNORMAL HIGH (ref 6–23)
CALCIUM: 8.6 mg/dL (ref 8.4–10.5)
CHLORIDE: 97 meq/L (ref 96–112)
CO2: 23 meq/L (ref 19–32)
Creatinine, Ser: 8.86 mg/dL — ABNORMAL HIGH (ref 0.50–1.10)
GFR calc non Af Amer: 4 mL/min — ABNORMAL LOW (ref >90)
GFR, EST AFRICAN AMERICAN: 4 mL/min — AB (ref >90)
Glucose, Bld: 119 mg/dL — ABNORMAL HIGH (ref 70–99)
Potassium: 3.8 mEq/L (ref 3.7–5.3)
Sodium: 138 mEq/L (ref 137–147)

## 2013-07-25 LAB — URINALYSIS, ROUTINE W REFLEX MICROSCOPIC
Glucose, UA: NEGATIVE mg/dL
Ketones, ur: 15 mg/dL — AB
Nitrite: NEGATIVE
Protein, ur: >300 mg/dL — AB
SPECIFIC GRAVITY, URINE: 1.019 (ref 1.005–1.030)
Urobilinogen, UA: 0.2 mg/dL (ref 0.0–1.0)
pH: 6 (ref 5.0–8.0)

## 2013-07-25 LAB — PHOSPHORUS: Phosphorus: 4.3 mg/dL (ref 2.3–4.6)

## 2013-07-25 LAB — HEPATIC FUNCTION PANEL
ALT: 11 U/L (ref 0–35)
AST: 39 U/L — AB (ref 0–37)
Albumin: 2.1 g/dL — ABNORMAL LOW (ref 3.5–5.2)
Alkaline Phosphatase: 86 U/L (ref 39–117)
BILIRUBIN TOTAL: 0.3 mg/dL (ref 0.3–1.2)
Bilirubin, Direct: <0.2 mg/dL (ref 0.0–0.3)
Total Protein: 5.7 g/dL — ABNORMAL LOW (ref 6.0–8.3)

## 2013-07-25 LAB — PRO B NATRIURETIC PEPTIDE: Pro B Natriuretic peptide (BNP): >70000 pg/mL — ABNORMAL HIGH (ref 0–450)

## 2013-07-25 LAB — CREATININE, SERUM
CREATININE: 8.91 mg/dL — AB (ref 0.50–1.10)
GFR calc Af Amer: 4 mL/min — ABNORMAL LOW (ref >90)
GFR calc non Af Amer: 4 mL/min — ABNORMAL LOW (ref >90)

## 2013-07-25 LAB — URINE MICROSCOPIC-ADD ON

## 2013-07-25 LAB — TROPONIN I: Troponin I: <0.3 ng/mL (ref <0.30)

## 2013-07-25 LAB — MAGNESIUM: MAGNESIUM: 2.1 mg/dL (ref 1.5–2.5)

## 2013-07-25 MED ORDER — VANCOMYCIN HCL IN DEXTROSE 1-5 GM/200ML-% IV SOLN
1000.0000 mg | Freq: Once | INTRAVENOUS | Status: AC
  Administered 2013-07-25: 1000 mg via INTRAVENOUS
  Filled 2013-07-25: qty 200

## 2013-07-25 MED ORDER — DEXTROSE 5 % IV SOLN
5.0000 mg/kg | INTRAVENOUS | Status: DC
  Filled 2013-07-25: qty 7.3

## 2013-07-25 MED ORDER — CLONIDINE HCL 0.2 MG PO TABS
0.2000 mg | ORAL_TABLET | Freq: Two times a day (BID) | ORAL | Status: DC
  Administered 2013-07-26 – 2013-07-28 (×5): 0.2 mg via ORAL
  Filled 2013-07-25 (×7): qty 1

## 2013-07-25 MED ORDER — DEXTROSE 5 % IV SOLN
700.0000 mg | Freq: Once | INTRAVENOUS | Status: AC
  Administered 2013-07-25: 700 mg via INTRAVENOUS
  Filled 2013-07-25 (×2): qty 14

## 2013-07-25 MED ORDER — DOXAZOSIN MESYLATE 4 MG PO TABS
4.0000 mg | ORAL_TABLET | Freq: Two times a day (BID) | ORAL | Status: DC
Start: 2013-07-25 — End: 2013-07-26
  Administered 2013-07-25: 4 mg via ORAL
  Filled 2013-07-25 (×3): qty 1

## 2013-07-25 MED ORDER — VANCOMYCIN HCL 500 MG IV SOLR
500.0000 mg | Freq: Once | INTRAVENOUS | Status: AC
  Administered 2013-07-25: 500 mg via INTRAVENOUS
  Filled 2013-07-25: qty 500

## 2013-07-25 MED ORDER — DEXTROSE 5 % IV SOLN: 5.0000 mg/kg | Freq: Three times a day (TID) | INTRAVENOUS | Status: DC

## 2013-07-25 MED ORDER — CALCITRIOL 0.25 MCG PO CAPS: 0.2500 ug | ORAL_CAPSULE | ORAL | Status: DC

## 2013-07-25 MED ORDER — DEXTROSE 5 % IV SOLN
1.0000 g | Freq: Once | INTRAVENOUS | Status: DC
  Filled 2013-07-25: qty 10

## 2013-07-25 MED ORDER — ATORVASTATIN CALCIUM 40 MG PO TABS
40.0000 mg | ORAL_TABLET | Freq: Every day | ORAL | Status: DC
  Administered 2013-07-26 – 2013-07-31 (×6): 40 mg via ORAL
  Filled 2013-07-25 (×8): qty 1

## 2013-07-25 MED ORDER — HEPARIN SODIUM (PORCINE) 5000 UNIT/ML IJ SOLN
5000.0000 [IU] | Freq: Three times a day (TID) | INTRAMUSCULAR | Status: DC
  Administered 2013-07-25 – 2013-07-31 (×15): 5000 [IU] via SUBCUTANEOUS
  Filled 2013-07-25 (×21): qty 1

## 2013-07-25 MED ORDER — FERROUS SULFATE 325 (65 FE) MG PO TABS
650.0000 mg | ORAL_TABLET | Freq: Every day | ORAL | Status: DC
  Administered 2013-07-26 – 2013-07-28 (×3): 650 mg via ORAL
  Filled 2013-07-25 (×5): qty 2

## 2013-07-25 MED ORDER — SODIUM CHLORIDE 0.9 % IJ SOLN
3.0000 mL | Freq: Two times a day (BID) | INTRAMUSCULAR | Status: DC
  Administered 2013-07-25 – 2013-07-31 (×9): 3 mL via INTRAVENOUS

## 2013-07-25 MED ORDER — ASPIRIN 325 MG PO TABS
325.0000 mg | ORAL_TABLET | Freq: Every day | ORAL | Status: DC
  Administered 2013-07-26 – 2013-07-31 (×6): 325 mg via ORAL
  Filled 2013-07-25 (×8): qty 1

## 2013-07-25 MED ORDER — METHYLPREDNISOLONE SODIUM SUCC 125 MG IJ SOLR
125.0000 mg | Freq: Two times a day (BID) | INTRAMUSCULAR | Status: DC
  Administered 2013-07-26 – 2013-07-27 (×3): 125 mg via INTRAVENOUS
  Filled 2013-07-25 (×5): qty 2

## 2013-07-25 MED ORDER — ACETAMINOPHEN 500 MG PO TABS
1000.0000 mg | ORAL_TABLET | Freq: Two times a day (BID) | ORAL | Status: DC | PRN
  Administered 2013-07-26: 1000 mg via ORAL
  Filled 2013-07-25: qty 2

## 2013-07-25 MED ORDER — DEXTROSE 5 % IV SOLN
2.0000 g | Freq: Once | INTRAVENOUS | Status: AC
  Administered 2013-07-25: 2 g via INTRAVENOUS
  Filled 2013-07-25: qty 2

## 2013-07-25 MED ORDER — LABETALOL HCL 200 MG PO TABS
200.0000 mg | ORAL_TABLET | Freq: Three times a day (TID) | ORAL | Status: DC
Start: 2013-07-25 — End: 2013-07-26
  Administered 2013-07-25: 200 mg via ORAL
  Filled 2013-07-25 (×4): qty 1

## 2013-07-25 MED ORDER — DEXTROSE 5 % IV SOLN: 1.0000 g | INTRAVENOUS | Status: DC

## 2013-07-25 MED ORDER — DEXTROSE 5 % IV SOLN: 1.0000 g | Freq: Three times a day (TID) | INTRAVENOUS | Status: DC

## 2013-07-25 MED ORDER — ADULT MULTIVITAMIN W/MINERALS CH
1.0000 | ORAL_TABLET | Freq: Every day | ORAL | Status: DC
  Administered 2013-07-26: 1 via ORAL
  Filled 2013-07-25 (×2): qty 1

## 2013-07-25 NOTE — ED Provider Notes (Signed)
CSN: [id number]     Arrival date & time 07/25/13  1323 History   First MD Initiated Contact with Patient 07/25/13 1504     Chief Complaint  Patient presents with  . Weakness  . Herpes Zoster     (Consider location/radiation/quality/duration/timing/severity/associated sxs/prior Treatment) HPI Comments: 80 y.o. female with a history of Stage IV CKD, HL,and HTN who was brought to the ED with cc of weakness and numbness. Pt reports that she was just started on dialysis, and has a left sided tunneled cath. She missed her dialysis on Friday and today - today -because of weakness. Pt has generalized weakness that started today. Pt denies nausea, emesis, fevers, chills, chest pains, shortness of breath, headaches, abdominal pain, uti like symptoms. She does report waking up with left breast rash and left back rash. Rash area feels "bad" but she is unable to describe pain. It appears that patient is also seeing Oncology for some hematologic condition. Not on chemo.   The history is provided by the patient.    Past Medical History  Diagnosis Date  . Anemia   . Hypertension   . Hyperlipidemia   . Chronic kidney disease   . Hypothyroidism   . End-stage renal disease (ESRD)   . Urinary incontinence, functional   . Anxiety    Past Surgical History  Procedure Laterality Date  . Cataract extraction  06/07/2010  . Transthoracic echocardiogram  10/2009    EF=>55%, mild conc LVH; LA mildly dilated; mild mitral annular calcif, mild MR; mild TR, RVSP 40-52mmHg; AV mildly sclerotic; mild pulm valve regurg; aortic root sclerosis/calcif   . Nm myocar perf wall motion  10/2009    dipyridamole; mild-mod ischemia in apical anterior, basal inferolateral, mid inferoalteral region; post-stress EF 66%; high risk   . Av fistula placement Right 07/07/2013    Procedure: ARTERIOVENOUS (AV) FISTULA CREATION ;  Surgeon: Mal Misty, MD;  Location: [facility] OR;  Service: Vascular;  Laterality: Right;   Family History   Problem Relation Age of Onset  . Hyperlipidemia Other   . Hypertension Other   . Kidney disease Other   . Diabetes Other   . Hypertension Mother   . Diabetes Daughter   . Hypertension Daughter   . Cancer Son   . Cancer Sister    History  Substance Use Topics  . Smoking status: Never Smoker   . Smokeless tobacco: Never Used  . Alcohol Use: No   OB History   Grav Para Term Preterm Abortions TAB SAB Ect Mult Living                 Review of Systems  Constitutional: Positive for activity change and fatigue. Negative for fever, chills and diaphoresis.  HENT: Negative for facial swelling.   Eyes: Negative for visual disturbance.  Respiratory: Negative for cough, shortness of breath and wheezing.   Cardiovascular: Negative for chest pain and leg swelling.  Gastrointestinal: Negative for nausea, vomiting, abdominal pain, diarrhea, constipation, blood in stool and abdominal distention.  Genitourinary: Negative for hematuria and difficulty urinating.  Musculoskeletal: Negative for neck pain.  Skin: Positive for rash. Negative for color change.  Neurological: Positive for weakness. Negative for speech difficulty and headaches.  Hematological: Does not bruise/bleed easily.  Psychiatric/Behavioral: Negative for confusion.      Allergies  Norvasc and Zocor  Home Medications   Prior to Admission medications   Medication Sig Start Date End Date Taking? Authorizing Provider  acetaminophen (TYLENOL) 500 MG tablet Take 1,000  mg by mouth 2 (two) times daily as needed (pain).   Yes Historical Provider, MD  aspirin (BAYER ASPIRIN) 325 MG tablet Take 325 mg by mouth daily.     Yes Historical Provider, MD  calcitRIOL (ROCALTROL) 0.25 MCG capsule Take 0.25 mcg by mouth every Monday, Wednesday, and Friday.    Yes Historical Provider, MD  cloNIDine (CATAPRES) 0.2 MG tablet Take 0.2 mg by mouth 2 (two) times daily.   Yes Historical Provider, MD  doxazosin (CARDURA) 2 MG tablet Take 4 mg by  mouth 2 (two) times daily.    Yes Historical Provider, MD  ferrous sulfate 325 (65 FE) MG tablet Take 650 mg by mouth daily with breakfast.   Yes Historical Provider, MD  labetalol (NORMODYNE) 200 MG tablet Take 200 mg by mouth 3 (three) times daily.    Yes Historical Provider, MD  Multiple Vitamin (MULTIVITAMIN WITH MINERALS) TABS tablet Take 1 tablet by mouth daily.   Yes Historical Provider, MD  rosuvastatin (CRESTOR) 20 MG tablet Take 20 mg by mouth every evening.   Yes Historical Provider, MD   BP 189/74  Pulse 60  Temp(Src) 98.2 F (36.8 C) (Oral)  Resp 18  Ht 5' (1.524 m)  Wt 151 lb 14.4 oz (68.9 kg)  BMI 29.67 kg/m2  SpO2 100% Physical Exam  Nursing note and vitals reviewed. Constitutional: She is oriented to person, place, and time. She appears well-developed and well-nourished.  HENT:  Head: Normocephalic and atraumatic.  Eyes: EOM are normal. Pupils are equal, round, and reactive to light.  Neck: Neck supple.  Cardiovascular: Normal rate and regular rhythm.   Murmur heard. Pulmonary/Chest: Effort normal. No respiratory distress. She has no wheezes.  Abdominal: Soft. She exhibits no distension. There is no tenderness. There is no rebound and no guarding.  Neurological: She is alert and oriented to person, place, and time.  Skin: Skin is warm and dry. Rash noted.  Pt has left breash vesicular rash. Linear. Extends into the left back region as well. There is axillary lymphadenopathy. No drainage.    ED Course  Procedures (including critical care time) Labs Review Labs Reviewed  CBC WITH DIFFERENTIAL - Abnormal; Notable for the following:    RBC 3.21 (*)    Hemoglobin 8.5 (*)    HCT 26.9 (*)    All other components within normal limits  BASIC METABOLIC PANEL - Abnormal; Notable for the following:    Glucose, Bld 119 (*)    BUN 40 (*)    Creatinine, Ser 8.86 (*)    GFR calc non Af Amer 4 (*)    GFR calc Af Amer 4 (*)    Anion gap 18 (*)    All other components  within normal limits  HEPATIC FUNCTION PANEL - Abnormal; Notable for the following:    Total Protein 5.7 (*)    Albumin 2.1 (*)    AST 39 (*)    All other components within normal limits  URINALYSIS, ROUTINE W REFLEX MICROSCOPIC - Abnormal; Notable for the following:    APPearance TURBID (*)    Hgb urine dipstick LARGE (*)    Bilirubin Urine SMALL (*)    Ketones, ur 15 (*)    Protein, ur >300 (*)    Leukocytes, UA LARGE (*)    All other components within normal limits  PRO B NATRIURETIC PEPTIDE - Abnormal; Notable for the following:    Pro B Natriuretic peptide (BNP) >70000.0 (*)    All other components within normal  limits  CBC - Abnormal; Notable for the following:    RBC 3.13 (*)    Hemoglobin 8.4 (*)    HCT 26.6 (*)    All other components within normal limits  CREATININE, SERUM - Abnormal; Notable for the following:    Creatinine, Ser 8.91 (*)    GFR calc non Af Amer 4 (*)    GFR calc Af Amer 4 (*)    All other components within normal limits  COMPREHENSIVE METABOLIC PANEL - Abnormal; Notable for the following:    Glucose, Bld 110 (*)    BUN 45 (*)    Creatinine, Ser 9.23 (*)    Calcium 8.1 (*)    Total Protein 5.2 (*)    Albumin 1.9 (*)    AST 41 (*)    GFR calc non Af Amer 4 (*)    GFR calc Af Amer 4 (*)    Anion gap 18 (*)    All other components within normal limits  CBC WITH DIFFERENTIAL - Abnormal; Notable for the following:    RBC 3.06 (*)    Hemoglobin 8.2 (*)    HCT 25.8 (*)    All other components within normal limits  COMPREHENSIVE METABOLIC PANEL - Abnormal; Notable for the following:    Sodium 134 (*)    Chloride 92 (*)    Glucose, Bld 209 (*)    Creatinine, Ser 5.07 (*)    Calcium 8.1 (*)    Albumin 2.1 (*)    AST 42 (*)    Total Bilirubin 0.2 (*)    GFR calc non Af Amer 7 (*)    GFR calc Af Amer 9 (*)    Anion gap 17 (*)    All other components within normal limits  CBC WITH DIFFERENTIAL - Abnormal; Notable for the following:    RBC 3.38  (*)    Hemoglobin 9.0 (*)    HCT 27.8 (*)    Neutrophils Relative % 79 (*)    All other components within normal limits  GLUCOSE, CAPILLARY - Abnormal; Notable for the following:    Glucose-Capillary 176 (*)    All other components within normal limits  URINE CULTURE  CULTURE, BLOOD (ROUTINE X 2)  CULTURE, BLOOD (ROUTINE X 2)  MRSA PCR SCREENING  GRAM STAIN  PHOSPHORUS  MAGNESIUM  TROPONIN I  URINE MICROSCOPIC-ADD ON  HIV ANTIBODY (ROUTINE TESTING)  TROPONIN I  TROPONIN I  TROPONIN I  GLUCOSE, CAPILLARY  LEGIONELLA ANTIGEN, URINE  STREP PNEUMONIAE URINARY ANTIGEN    Imaging Review Dg Chest Port 1 View  07/25/2013   CLINICAL DATA:  Weakness.  EXAM: PORTABLE CHEST - 1 VIEW  COMPARISON:  07/02/2013.  FINDINGS: Dialysis catheter in stable position. Interval removal of left IJ line. Mediastinum and hilar structures are unremarkable. Stable cardiomegaly. Mild basilar atelectasis. Mild infiltrate right lung base cannot be excluded. No significant pleural effusion or pneumothorax.  IMPRESSION: 1. Interval removal of left IJ line. Dialysis catheter in stable position. 2. Stable cardiomegaly. 3. Mild basilar atelectasis. Mild pneumonia right lung base cannot be excluded.   Electronically Signed   By: Marcello Moores  Register   On: 07/25/2013 14:29     EKG Interpretation   Date/Time:  Monday July 25 2013 15:52:16 EDT Ventricular Rate:  60 PR Interval:  176 QRS Duration: 124 QT Interval:  482 QTC Calculation: 482 R Axis:   -44 Text Interpretation:  Sinus rhythm Atrial premature complex Probable left  atrial enlargement Left bundle branch block Confirmed  by Kathrynn Humble, MD,  Thelma Comp [phone]) on 07/25/2013 6:21:52 PM       Date: 07/25/2013  Rate: 34  Rhythm: normal sinus rhythm  QRS Axis: left  Intervals: QT prolonged  ST/T Wave abnormalities: nonspecific ST/T changes  Conduction Disutrbances:none  Narrative Interpretation:   Old EKG Reviewed: unchanged   MDM   Final diagnoses:  UTI  (lower urinary tract infection)  ESRD (end stage renal disease) on dialysis  Dyspnea  Herpes zoster    DDx: Sepsis syndrome ACS syndrome CHF exacerbation Infection - pneumonia/UTI/Cellulitis PE Dehydration Electrolyte abnormality Tox syndrome Herpes Zoster Bactermia  Pt comes in with cc of generalized weakness. Her exam is benign, except for the rash and the cardiac murmur. The dialysis site looks clean as well. Labs are looking benign currently, except for CKD. Based on the CXR and the lung exam and lab exam and lack of coughing - no clinical concerns for PNA. Will get troponin - she had a demand ischemia last time she was here. 0 SIRS criteria, but Will get blood cultures, as she just had a recent tunneled cath. Will start acyclovir, immunocompromised status dose - as i am not sure what underlying oncologic process she has, and if that makes her immune system weak.  Varney Biles, MD 07/27/13 1323

## 2013-07-25 NOTE — ED Notes (Signed)
Daughter -- [phone]  ( carline) wants to be called when a disposition is made.

## 2013-07-25 NOTE — ED Notes (Signed)
To ED via [facility] from home, with c/o missed dialysis on Friday and Monday because she felt too weak to get to the car. When getting undressed, blisters noted on left breast. Pt states blisters popped up today, and she has been feeling weak because of them.

## 2013-07-25 NOTE — ED Notes (Signed)
Pt unable to ambulate in hall.

## 2013-07-25 NOTE — H&P (Addendum)
Hospitalist Admission History and Physical  Patient name: [name] Medical record number: [id number] Date of birth: 31-Dec-1932 Age: 80 y.o. Gender: female  Primary Care Provider: Scarlette Calico, MD  Chief Complaint: weakness, HCAP, rash  History of Present Illness:This is a 80 y.o. year old female with significant past medical history of ESRD recently started on HD MWF, diastolic CHF, HTN, hx/o uremic encephalopathy s/p HD  presenting with weakness, HCAP, rash. Pt noted to be overall poor historian, though pt reports missing dialysis today because of weakness. Pt states that she felt too weak to go to dialysis and she somehow ended up in ED. Weakness has been present for only today. Denies any fevers or chills. Also reports rash across the left side of her back left side of her chest. Has had some itching over affected area though no radicular symptoms. Minimal pain. Noted to have indicated in June 19 2 June 26 for uremia as well as community-acquired pneumonia. Has been getting regular scheduled dialysis at this point. Presented to the ER today afebrile, hemodynamically stable but pressures in the 902I to 097D systolic. 7 greater than 53% on room air. White blood cell count 7.6, hemoglobin 8.5, creatinine 8.6, BUN 40, bicarbonate 23, troponin within normal limits. Chest x-ray shows stable cardiomegaly and mild pneumonia on the right lung base cannot be excluded. renal consulted.   Hosp d/c weight 6/25: 75.4 kg  Admission weight: 73 kg   Assessment and Plan: Jaclyn Morgan is a 80 y.o. year old female presenting with weakness, HCAP, rash   Weakness: likely multifactorial with contributions of ESRD, anemia, HCAP, rash. Start HCAP treatment. Pan culture. Pending renal c/s for likely HD. Start tx for rash (likely herpes zoster). Cycle CEs. ProBNP pending. Tele bed.   HCAP: vanc and cefepime. Pan culture. Urine strep and legionella. I/S. Cont to follow.   Rash: Overall distribution and  morphology of rash consistent with herpes zoster. Viral culture vesicular fluid. Acyclovir. IV solumedrol. Continue to follow closely.   ESRD: pending renal consult. K, bicarb WNL. Continue home meds.   HTN/CHF: minimally to mildly volume overloaded in setting of missed HD. Pro BNP pending. Renal consult pending. Continue home regimen in the interim. Cycle CEs. Tele bed.   Anemia: noted anemia renal disease. hgb stable. No active signs of bleeding. Continue to follow.   FEN/GI: Renal diet.  Prophylaxis: sub q heparin  Disposition: pending further evaluation  Code Status:Full Code    Patient Active Problem List   Diagnosis Date Noted  . ESRD (end stage renal disease) on dialysis 07/25/2013  . Weakness 07/25/2013  . Metabolic encephalopathy 29/92/4268  . Uremia 07/02/2013  . CKD (chronic kidney disease), stage IV 07/02/2013  . Hypertensive urgency 07/02/2013  . CAP (community acquired pneumonia) 07/02/2013  . Acute diastolic CHF (congestive heart failure) 07/02/2013  . Anemia of renal disease 07/02/2013  . Hyperkalemia 07/02/2013  . Elevated troponin 07/02/2013  . Renal failure 07/01/2013  . End stage renal disease 12/23/2012  . Unspecified hypothyroidism 07/26/2012  . Urinary frequency 07/23/2012  . UTI (urinary tract infection) 07/23/2012  . Renal failure, chronic 09/30/2010  . Anxiety 05/03/2010  . Hyperlipidemia LDL goal < 100 05/03/2010  . Essential hypertension, benign 05/03/2010   Past Medical History: Past Medical History  Diagnosis Date  . Anemia   . Hypertension   . Hyperlipidemia   . Chronic kidney disease   . Hypothyroidism   . End-stage renal disease (ESRD)   . Urinary incontinence, functional   .  Anxiety     Past Surgical History: Past Surgical History  Procedure Laterality Date  . Cataract extraction  06/07/2010  . Transthoracic echocardiogram  10/2009    EF=>55%, mild conc LVH; LA mildly dilated; mild mitral annular calcif, mild MR; mild TR, RVSP  40-62mmHg; AV mildly sclerotic; mild pulm valve regurg; aortic root sclerosis/calcif   . Nm myocar perf wall motion  10/2009    dipyridamole; mild-mod ischemia in apical anterior, basal inferolateral, mid inferoalteral region; post-stress EF 66%; high risk   . Av fistula placement Right 07/07/2013    Procedure: ARTERIOVENOUS (AV) FISTULA CREATION ;  Surgeon: Mal Misty, MD;  Location: [facility] OR;  Service: Vascular;  Laterality: Right;    Social History: History   Social History  . Marital Status: Widowed    Spouse Name: N/A    Number of Children: 3  . Years of Education: B.A.    Social History Main Topics  . Smoking status: Never Smoker   . Smokeless tobacco: Never Used  . Alcohol Use: No  . Drug Use: No  . Sexual Activity: Not Currently   Other Topics Concern  . Not on file   Social History Narrative  . No narrative on file    Family History: Family History  Problem Relation Age of Onset  . Hyperlipidemia Other   . Hypertension Other   . Kidney disease Other   . Diabetes Other   . Hypertension Mother   . Diabetes Daughter   . Hypertension Daughter   . Cancer Son   . Cancer Sister     Allergies: Allergies  Allergen Reactions  . Norvasc [Amlodipine Besylate] Nausea Only  . Zocor [Simvastatin] Other (See Comments)    Kidney Issues?    Current Facility-Administered Medications  Medication Dose Route Frequency Provider Last Rate Last Dose  . acetaminophen (TYLENOL) tablet 1,000 mg  1,000 mg Oral BID PRN Shanda Howells, MD      . aspirin tablet 325 mg  325 mg Oral Daily Shanda Howells, MD      . Derrill Memo ON 07/26/2013] atorvastatin (LIPITOR) tablet 40 mg  40 mg Oral q1800 Shanda Howells, MD      . calcitRIOL (ROCALTROL) capsule 0.25 mcg  0.25 mcg Oral Q M,W,F Shanda Howells, MD      . Derrill Memo ON 07/26/2013] ceFEPIme (MAXIPIME) 1 g in dextrose 5 % 50 mL IVPB  1 g Intravenous Q24H Shanda Howells, MD      . ceFEPIme (MAXIPIME) 2 g in dextrose 5 % 50 mL IVPB  2 g Intravenous  Once Ankit Nanavati, MD      . cloNIDine (CATAPRES) tablet 0.2 mg  0.2 mg Oral BID Shanda Howells, MD      . doxazosin (CARDURA) tablet 4 mg  4 mg Oral BID Shanda Howells, MD      . Derrill Memo ON 07/26/2013] ferrous sulfate tablet 650 mg  650 mg Oral Q breakfast Shanda Howells, MD      . heparin injection 5,000 Units  5,000 Units Subcutaneous 3 times per day Shanda Howells, MD      . labetalol (NORMODYNE) tablet 200 mg  200 mg Oral TID Shanda Howells, MD      . methylPREDNISolone sodium succinate (SOLU-MEDROL) 125 mg/2 mL injection 125 mg  125 mg Intravenous Q12H Shanda Howells, MD      . multivitamin with minerals tablet 1 tablet  1 tablet Oral Daily Shanda Howells, MD      . sodium chloride 0.9 % injection 3  mL  3 mL Intravenous Q12H Shanda Howells, MD      . vancomycin (VANCOCIN) IVPB 1000 mg/200 mL premix  1,000 mg Intravenous Once Varney Biles, MD 200 mL/hr at 07/25/13 1906 1,000 mg at 07/25/13 1906   Current Outpatient Prescriptions  Medication Sig Dispense Refill  . acetaminophen (TYLENOL) 500 MG tablet Take 1,000 mg by mouth 2 (two) times daily as needed (pain).      Marland Kitchen aspirin (BAYER ASPIRIN) 325 MG tablet Take 325 mg by mouth daily.        . calcitRIOL (ROCALTROL) 0.25 MCG capsule Take 0.25 mcg by mouth every Monday, Wednesday, and Friday.       . cloNIDine (CATAPRES) 0.2 MG tablet Take 0.2 mg by mouth 2 (two) times daily.      Marland Kitchen doxazosin (CARDURA) 2 MG tablet Take 4 mg by mouth 2 (two) times daily.       . ferrous sulfate 325 (65 FE) MG tablet Take 650 mg by mouth daily with breakfast.      . labetalol (NORMODYNE) 200 MG tablet Take 200 mg by mouth 3 (three) times daily.       . Multiple Vitamin (MULTIVITAMIN WITH MINERALS) TABS tablet Take 1 tablet by mouth daily.      . rosuvastatin (CRESTOR) 20 MG tablet Take 20 mg by mouth every evening.       Review Of Systems: 12 point ROS negative except as noted above in HPI.  Physical Exam: Filed Vitals:   07/25/13 1900  BP: 190/64  Pulse: 57   Temp:   Resp: 19    General: cooperative HEENT: PERRLA and extra ocular movement intact Heart: S1, S2 normal, no murmur, rub or gallop, regular rate and rhythm Lungs: unlabored breathing and no focal pathology auscultated  Abdomen: abdomen is soft without significant tenderness, masses, organomegaly or guarding Extremities: extremities normal, atraumatic, no cyanosis or edema Skin:+ vesicular rash on L upper back and L chest wall, dermatomal distribution  Neurology: normal without focal findings  Labs and Imaging: Lab Results  Component Value Date/Time   NA 138 07/25/2013  2:20 PM   NA 139 06/02/2012 12:55 PM   K 3.8 07/25/2013  2:20 PM   K 3.7 06/02/2012 12:55 PM   CL 97 07/25/2013  2:20 PM   CL 104 06/02/2012 12:55 PM   CO2 23 07/25/2013  2:20 PM   CO2 25 06/02/2012 12:55 PM   BUN 40* 07/25/2013  2:20 PM   BUN 70.5* 06/02/2012 12:55 PM   CREATININE 8.86* 07/25/2013  2:20 PM   CREATININE 4.5 Repeated and Verified* 06/02/2012 12:55 PM   GLUCOSE 119* 07/25/2013  2:20 PM   GLUCOSE 124* 06/02/2012 12:55 PM   Lab Results  Component Value Date   WBC 7.6 07/25/2013   HGB 8.5* 07/25/2013   HCT 26.9* 07/25/2013   MCV 83.8 07/25/2013   PLT 223 07/25/2013    Dg Chest Port 1 View  07/25/2013   CLINICAL DATA:  Weakness.  EXAM: PORTABLE CHEST - 1 VIEW  COMPARISON:  07/02/2013.  FINDINGS: Dialysis catheter in stable position. Interval removal of left IJ line. Mediastinum and hilar structures are unremarkable. Stable cardiomegaly. Mild basilar atelectasis. Mild infiltrate right lung base cannot be excluded. No significant pleural effusion or pneumothorax.  IMPRESSION: 1. Interval removal of left IJ line. Dialysis catheter in stable position. 2. Stable cardiomegaly. 3. Mild basilar atelectasis. Mild pneumonia right lung base cannot be excluded.   Electronically Signed   By: Marcello Moores  Register  On: 07/25/2013 14:29           Shanda Howells MD  Pager: [phone]

## 2013-07-25 NOTE — Consult Note (Signed)
ANTIBIOTIC CONSULT NOTE - INITIAL  Pharmacy Consult for Vancomycin/Cefepime, Acyclovir Indication: HCAP, Herpes zoster  Allergies  Allergen Reactions  . Norvasc [Amlodipine Besylate] Nausea Only  . Zocor [Simvastatin] Other (See Comments)    Kidney Issues?    Patient Measurements: Weight = 73kg  Vital Signs: Temp: 98.2 F (36.8 C) (07/13 1340) Temp src: Oral (07/13 1340) BP: 190/64 mmHg (07/13 1900) Pulse Rate: 57 (07/13 1900) Intake/Output from previous day:   Intake/Output from this shift:    Labs:  Recent Labs  07/25/13 1420  WBC 7.6  HGB 8.5*  PLT 223  CREATININE 8.86*   The CrCl is unknown because both a height and weight (above a minimum accepted value) are required for this calculation.  Microbiology: Recent Results (from the past 720 hour(s))  CULTURE, BLOOD (ROUTINE X 2)     Status: None   Collection Time    07/01/13 11:41 PM      Result Value Ref Range Status   Specimen Description BLOOD RIGHT ANTECUBITAL   Final   Special Requests BOTTLES DRAWN AEROBIC AND ANAEROBIC 5CC   Final   Culture  Setup Time     Final   Value: 07/02/2013 04:07  DEMOGRAPHIC UPDATE OCCURRED ON 07/01 AT 0106, QA FLAGS AND RANGES MAY NO LONGER BE VALID     Performed at [facility]   Culture     Final   Value: STAPHYLOCOCCUS SPECIES (COAGULASE NEGATIVE)     Note: THE SIGNIFICANCE OF ISOLATING THIS ORGANISM FROM A SINGLE SET OF BLOOD CULTURES WHEN MULTIPLE SETS ARE DRAWN IS UNCERTAIN. PLEASE NOTIFY THE MICROBIOLOGY DEPARTMENT WITHIN ONE WEEK IF SPECIATION AND SENSITIVITIES ARE REQUIRED.     Note: Gram Stain Report Called to,Read Back By and Verified With: PEGGY GERHARD 07/03/13 @ 5:59PM BY RUSCOE A. DEMOGRAPHIC UPDATE OCCURRED ON 07/01 AT 0106, QA FLAGS AND RANGES MAY NO LONGER BE VALID     Performed at [facility]   Report Status     Final   Value: 07/05/2013 FINAL DEMOGRAPHIC UPDATE OCCURRED ON 07/01 AT 0106, QA FLAGS AND RANGES MAY NO LONGER BE VALID  CULTURE,  BLOOD (ROUTINE X 2)     Status: None   Collection Time    07/01/13 11:51 PM      Result Value Ref Range Status   Specimen Description BLOOD RIGHT HAND   Final   Special Requests BOTTLES DRAWN AEROBIC ONLY 5CC   Final   Culture  Setup Time     Final   Value: 07/02/2013 04:08  DEMOGRAPHIC UPDATE OCCURRED ON 07/01 AT 0106, QA FLAGS AND RANGES MAY NO LONGER BE VALID     Performed at [facility]   Culture     Final   Value: NO GROWTH 5 DAYS DEMOGRAPHIC UPDATE OCCURRED ON 07/01 AT 0106, QA FLAGS AND RANGES MAY NO LONGER BE VALID     Performed at [facility]   Report Status     Final   Value: 07/08/2013 FINAL DEMOGRAPHIC UPDATE OCCURRED ON 07/01 AT 0106, QA FLAGS AND RANGES MAY NO LONGER BE VALID  MRSA PCR SCREENING     Status: Abnormal   Collection Time    07/02/13  5:15 AM      Result Value Ref Range Status   MRSA by PCR POSITIVE (*) NEGATIVE Final   Comment:            The GeneXpert MRSA Assay (FDA     approved for NASAL specimens  only), is one component of a     comprehensive MRSA colonization     surveillance program. It is not     intended to diagnose MRSA     infection nor to guide or     monitor treatment for     MRSA infections.     RESULT CALLED TO, READ BACK BY AND VERIFIED WITH:     MUHORO,D RN 07/02/13 0942 [place]  SURGICAL PCR SCREEN     Status: None   Collection Time    07/06/13 11:03 PM      Result Value Ref Range Status   MRSA, PCR NEGATIVE  NEGATIVE Final   Staphylococcus aureus NEGATIVE  NEGATIVE Final   Comment:            The Xpert SA Assay (FDA     approved for NASAL specimens     in patients over 81 years of age),     is one component of     a comprehensive surveillance     program.  Test performance has     been validated by [employer] for patients greater     than or equal to 53 year old.     It is not intended     to diagnose infection nor to     guide or monitor treatment.    Medical History: Past Medical History   Diagnosis Date  . Anemia   . Hypertension   . Hyperlipidemia   . Chronic kidney disease   . Hypothyroidism   . End-stage renal disease (ESRD)   . Urinary incontinence, functional   . Anxiety    Assessment: 80yof with ESRD recently started on dialysis MWF presented to the ED with weakness and rash. She missed dialysis last Friday and again today because she was took weak. CXR in the ED cannot exclude pneumonia. She will begin vancomycin and cefepime to cover for HCAP. Her rash is consistent with herpes zoster and she will also begin IV acyclovir. Renal consult pending.  Received 1g vancomycin, 2g cefepime, 700mg  acyclovir in the ED.  Goal of Therapy:  Pre HD vancomycin level 15-25  Plan:  1) Vancomycin 500mg  IV x 1 to complete 1500mg  loading dose then 750mg  IV qHD 2) Cefepime 2g IV qHD 3) Acyclovir 5mg /kg IV q24 4) Will need to follow up HD schedule to enter maintenance doses of vancomycin and cefepime  Deboraha Sprang 07/25/2013,8:29 PM

## 2013-07-26 ENCOUNTER — Encounter (HOSPITAL_COMMUNITY): Payer: Self-pay | Admitting: Emergency Medicine

## 2013-07-26 DIAGNOSIS — Z7982 Long term (current) use of aspirin: Secondary | ICD-10-CM | POA: Diagnosis not present

## 2013-07-26 DIAGNOSIS — N390 Urinary tract infection, site not specified: Secondary | ICD-10-CM | POA: Diagnosis present

## 2013-07-26 DIAGNOSIS — F29 Unspecified psychosis not due to a substance or known physiological condition: Secondary | ICD-10-CM | POA: Diagnosis present

## 2013-07-26 DIAGNOSIS — N039 Chronic nephritic syndrome with unspecified morphologic changes: Secondary | ICD-10-CM | POA: Diagnosis present

## 2013-07-26 DIAGNOSIS — E039 Hypothyroidism, unspecified: Secondary | ICD-10-CM | POA: Diagnosis present

## 2013-07-26 DIAGNOSIS — B029 Zoster without complications: Secondary | ICD-10-CM | POA: Diagnosis present

## 2013-07-26 DIAGNOSIS — Z79899 Other long term (current) drug therapy: Secondary | ICD-10-CM | POA: Diagnosis not present

## 2013-07-26 DIAGNOSIS — E785 Hyperlipidemia, unspecified: Secondary | ICD-10-CM | POA: Diagnosis present

## 2013-07-26 DIAGNOSIS — I5032 Chronic diastolic (congestive) heart failure: Secondary | ICD-10-CM | POA: Diagnosis present

## 2013-07-26 DIAGNOSIS — G9341 Metabolic encephalopathy: Secondary | ICD-10-CM | POA: Diagnosis present

## 2013-07-26 DIAGNOSIS — I120 Hypertensive chronic kidney disease with stage 5 chronic kidney disease or end stage renal disease: Secondary | ICD-10-CM | POA: Diagnosis present

## 2013-07-26 DIAGNOSIS — F411 Generalized anxiety disorder: Secondary | ICD-10-CM | POA: Diagnosis present

## 2013-07-26 DIAGNOSIS — J189 Pneumonia, unspecified organism: Secondary | ICD-10-CM

## 2013-07-26 DIAGNOSIS — N2581 Secondary hyperparathyroidism of renal origin: Secondary | ICD-10-CM | POA: Diagnosis present

## 2013-07-26 DIAGNOSIS — Z992 Dependence on renal dialysis: Secondary | ICD-10-CM | POA: Diagnosis not present

## 2013-07-26 DIAGNOSIS — I509 Heart failure, unspecified: Secondary | ICD-10-CM | POA: Diagnosis present

## 2013-07-26 DIAGNOSIS — F0390 Unspecified dementia without behavioral disturbance: Secondary | ICD-10-CM | POA: Diagnosis present

## 2013-07-26 DIAGNOSIS — R5381 Other malaise: Secondary | ICD-10-CM | POA: Diagnosis present

## 2013-07-26 DIAGNOSIS — R5383 Other fatigue: Secondary | ICD-10-CM | POA: Diagnosis present

## 2013-07-26 DIAGNOSIS — D631 Anemia in chronic kidney disease: Secondary | ICD-10-CM | POA: Diagnosis present

## 2013-07-26 DIAGNOSIS — A498 Other bacterial infections of unspecified site: Secondary | ICD-10-CM | POA: Diagnosis present

## 2013-07-26 DIAGNOSIS — N186 End stage renal disease: Secondary | ICD-10-CM | POA: Diagnosis present

## 2013-07-26 DIAGNOSIS — N184 Chronic kidney disease, stage 4 (severe): Secondary | ICD-10-CM

## 2013-07-26 DIAGNOSIS — R627 Adult failure to thrive: Secondary | ICD-10-CM | POA: Diagnosis present

## 2013-07-26 LAB — CBC WITH DIFFERENTIAL/PLATELET
Basophils Absolute: 0 10*3/uL (ref 0.0–0.1)
Basophils Relative: 0 % (ref 0–1)
EOS PCT: 3 % (ref 0–5)
Eosinophils Absolute: 0.2 10*3/uL (ref 0.0–0.7)
HCT: 25.8 % — ABNORMAL LOW (ref 36.0–46.0)
Hemoglobin: 8.2 g/dL — ABNORMAL LOW (ref 12.0–15.0)
LYMPHS PCT: 15 % (ref 12–46)
Lymphs Abs: 1.1 10*3/uL (ref 0.7–4.0)
MCH: 26.8 pg (ref 26.0–34.0)
MCHC: 31.8 g/dL (ref 30.0–36.0)
MCV: 84.3 fL (ref 78.0–100.0)
MONO ABS: 0.8 10*3/uL (ref 0.1–1.0)
Monocytes Relative: 11 % (ref 3–12)
NEUTROS PCT: 71 % (ref 43–77)
Neutro Abs: 5.4 10*3/uL (ref 1.7–7.7)
PLATELETS: 227 10*3/uL (ref 150–400)
RBC: 3.06 MIL/uL — AB (ref 3.87–5.11)
RDW: 14.7 % (ref 11.5–15.5)
SMEAR REVIEW: ADEQUATE
WBC: 7.5 10*3/uL (ref 4.0–10.5)

## 2013-07-26 LAB — COMPREHENSIVE METABOLIC PANEL
ALT: 11 U/L (ref 0–35)
ANION GAP: 18 — AB (ref 5–15)
AST: 41 U/L — ABNORMAL HIGH (ref 0–37)
Albumin: 1.9 g/dL — ABNORMAL LOW (ref 3.5–5.2)
Alkaline Phosphatase: 75 U/L (ref 39–117)
BUN: 45 mg/dL — ABNORMAL HIGH (ref 6–23)
CALCIUM: 8.1 mg/dL — AB (ref 8.4–10.5)
CO2: 23 mEq/L (ref 19–32)
CREATININE: 9.23 mg/dL — AB (ref 0.50–1.10)
Chloride: 98 mEq/L (ref 96–112)
GFR calc non Af Amer: 4 mL/min — ABNORMAL LOW (ref >90)
GFR, EST AFRICAN AMERICAN: 4 mL/min — AB (ref >90)
GLUCOSE: 110 mg/dL — AB (ref 70–99)
Potassium: 4 mEq/L (ref 3.7–5.3)
Sodium: 139 mEq/L (ref 137–147)
Total Bilirubin: 0.3 mg/dL (ref 0.3–1.2)
Total Protein: 5.2 g/dL — ABNORMAL LOW (ref 6.0–8.3)

## 2013-07-26 LAB — MRSA PCR SCREENING: MRSA BY PCR: NEGATIVE

## 2013-07-26 LAB — GLUCOSE, CAPILLARY: Glucose-Capillary: 92 mg/dL (ref 70–99)

## 2013-07-26 LAB — TROPONIN I
Troponin I: <0.3 ng/mL (ref <0.30)
Troponin I: <0.3 ng/mL (ref <0.30)

## 2013-07-26 LAB — HIV ANTIBODY (ROUTINE TESTING W REFLEX): HIV: NONREACTIVE

## 2013-07-26 MED ORDER — VALACYCLOVIR HCL 500 MG PO TABS
500.0000 mg | ORAL_TABLET | Freq: Every day | ORAL | Status: DC
  Administered 2013-07-26 – 2013-07-31 (×6): 500 mg via ORAL
  Filled 2013-07-26 (×8): qty 1

## 2013-07-26 MED ORDER — DEXTROSE 5 % IV SOLN
1.0000 g | Freq: Once | INTRAVENOUS | Status: AC
  Administered 2013-07-26: 1 g via INTRAVENOUS
  Filled 2013-07-26: qty 1

## 2013-07-26 MED ORDER — DEXTROSE 5 % IV SOLN: 1.0000 g | INTRAVENOUS | Status: DC

## 2013-07-26 MED ORDER — DEXTROSE 5 % IV SOLN
2.0000 g | INTRAVENOUS | Status: DC
  Filled 2013-07-26: qty 2

## 2013-07-26 MED ORDER — VANCOMYCIN HCL IN DEXTROSE 750-5 MG/150ML-% IV SOLN
750.0000 mg | INTRAVENOUS | Status: DC
  Filled 2013-07-26: qty 150

## 2013-07-26 MED ORDER — LABETALOL HCL 200 MG PO TABS
200.0000 mg | ORAL_TABLET | Freq: Three times a day (TID) | ORAL | Status: DC
  Administered 2013-07-26 – 2013-07-28 (×5): 200 mg via ORAL
  Filled 2013-07-26 (×9): qty 1

## 2013-07-26 MED ORDER — VANCOMYCIN HCL IN DEXTROSE 750-5 MG/150ML-% IV SOLN
750.0000 mg | INTRAVENOUS | Status: AC
  Administered 2013-07-26: 750 mg via INTRAVENOUS
  Filled 2013-07-26: qty 150

## 2013-07-26 NOTE — Progress Notes (Signed)
All blisters remain intact. Will pass on wound culture to oncoming shift. Will continue to monitor. Pt has not voided, u/a to obtain urine culture. Sputum culture container at bedside. Pt able to verbalize understanding of specimen collection, does not endorse productive cough but will attempt. Will continue to monitor.

## 2013-07-26 NOTE — Procedures (Signed)
I was present at this dialysis session, have reviewed the session itself and made  appropriate changes  Kelly Splinter MD (pgr) [phone]    ([phone] 07/26/2013, 12:38 PM

## 2013-07-26 NOTE — Progress Notes (Addendum)
ANTIBIOTIC CONSULT NOTE - FOLLOW UP  Pharmacy Consult for Vanc + Cefepime Indication: r/o HCAP  Allergies  Allergen Reactions  . Norvasc [Amlodipine Besylate] Nausea Only  . Zocor [Simvastatin] Other (See Comments)    Kidney Issues?    Patient Measurements: Height: 5' (152.4 cm) Weight: 157 lb 13.6 oz (71.6 kg) IBW/kg (Calculated) : 45.5  Vital Signs: Temp: 98.4 F (36.9 C) (07/14 0500) Temp src: Oral (07/14 0500) BP: 151/72 mmHg (07/14 0500) Pulse Rate: 60 (07/14 0500) Intake/Output from previous day: 07/13 0701 - 07/14 0700 In: 320 [P.O.:320] Out: -  Intake/Output from this shift: Total I/O In: 3 [I.V.:3] Out: -   Labs:  Recent Labs  07/25/13 1420 07/25/13 2019 07/26/13 0532  WBC 7.6 6.0 7.5  HGB 8.5* 8.4* 8.2*  PLT 223 202 227  CREATININE 8.86* 8.91* 9.23*   Estimated Creatinine Clearance: 4.4 ml/min (by C-G formula based on Cr of 9.23). No results found for this basename: VANCOTROUGH, VANCOPEAK, VANCORANDOM, GENTTROUGH, GENTPEAK, GENTRANDOM, TOBRATROUGH, TOBRAPEAK, TOBRARND, AMIKACINPEAK, AMIKACINTROU, AMIKACIN,  in the last 72 hours   Microbiology: Recent Results (from the past 720 hour(s))  CULTURE, BLOOD (ROUTINE X 2)     Status: None   Collection Time    07/01/13 11:41 PM      Result Value Ref Range Status   Specimen Description BLOOD RIGHT ANTECUBITAL   Final   Special Requests BOTTLES DRAWN AEROBIC AND ANAEROBIC 5CC   Final   Culture  Setup Time     Final   Value: 07/02/2013 04:07  DEMOGRAPHIC UPDATE OCCURRED ON 07/01 AT 0106, QA FLAGS AND RANGES MAY NO LONGER BE VALID     Performed at [facility]   Culture     Final   Value: STAPHYLOCOCCUS SPECIES (COAGULASE NEGATIVE)     Note: THE SIGNIFICANCE OF ISOLATING THIS ORGANISM FROM A SINGLE SET OF BLOOD CULTURES WHEN MULTIPLE SETS ARE DRAWN IS UNCERTAIN. PLEASE NOTIFY THE MICROBIOLOGY DEPARTMENT WITHIN ONE WEEK IF SPECIATION AND SENSITIVITIES ARE REQUIRED.     Note: Gram Stain Report Called  to,Read Back By and Verified With: PEGGY GERHARD 07/03/13 @ 5:59PM BY RUSCOE A. DEMOGRAPHIC UPDATE OCCURRED ON 07/01 AT 0106, QA FLAGS AND RANGES MAY NO LONGER BE VALID     Performed at [facility]   Report Status     Final   Value: 07/05/2013 FINAL DEMOGRAPHIC UPDATE OCCURRED ON 07/01 AT 0106, QA FLAGS AND RANGES MAY NO LONGER BE VALID  CULTURE, BLOOD (ROUTINE X 2)     Status: None   Collection Time    07/01/13 11:51 PM      Result Value Ref Range Status   Specimen Description BLOOD RIGHT HAND   Final   Special Requests BOTTLES DRAWN AEROBIC ONLY 5CC   Final   Culture  Setup Time     Final   Value: 07/02/2013 04:08  DEMOGRAPHIC UPDATE OCCURRED ON 07/01 AT 0106, QA FLAGS AND RANGES MAY NO LONGER BE VALID     Performed at [facility]   Culture     Final   Value: NO GROWTH 5 DAYS DEMOGRAPHIC UPDATE OCCURRED ON 07/01 AT 0106, QA FLAGS AND RANGES MAY NO LONGER BE VALID     Performed at [facility]   Report Status     Final   Value: 07/08/2013 FINAL DEMOGRAPHIC UPDATE OCCURRED ON 07/01 AT 0106, QA FLAGS AND RANGES MAY NO LONGER BE VALID  MRSA PCR SCREENING     Status: Abnormal  Collection Time    07/02/13  5:15 AM      Result Value Ref Range Status   MRSA by PCR POSITIVE (*) NEGATIVE Final   Comment:            The GeneXpert MRSA Assay (FDA     approved for NASAL specimens     only), is one component of a     comprehensive MRSA colonization     surveillance program. It is not     intended to diagnose MRSA     infection nor to guide or     monitor treatment for     MRSA infections.     RESULT CALLED TO, READ BACK BY AND VERIFIED WITH:     MUHORO,D RN 07/02/13 0942 [place]  SURGICAL PCR SCREEN     Status: None   Collection Time    07/06/13 11:03 PM      Result Value Ref Range Status   MRSA, PCR NEGATIVE  NEGATIVE Final   Staphylococcus aureus NEGATIVE  NEGATIVE Final   Comment:            The Xpert SA Assay (FDA     approved for NASAL specimens     in  patients over 62 years of age),     is one component of     a comprehensive surveillance     program.  Test performance has     been validated by [employer] for patients greater     than or equal to 71 year old.     It is not intended     to diagnose infection nor to     guide or monitor treatment.  CULTURE, BLOOD (ROUTINE X 2)     Status: None   Collection Time    07/25/13  4:10 PM      Result Value Ref Range Status   Specimen Description BLOOD RIGHT ARM   Final   Special Requests BOTTLES DRAWN AEROBIC AND ANAEROBIC 5CC   Final   Culture  Setup Time     Final   Value: 07/25/2013 19:22     Performed at [facility]   Culture     Final   Value:        BLOOD CULTURE RECEIVED NO GROWTH TO DATE CULTURE WILL BE HELD FOR 5 DAYS BEFORE ISSUING A FINAL NEGATIVE REPORT     Performed at [facility]   Report Status PENDING   Incomplete  CULTURE, BLOOD (ROUTINE X 2)     Status: None   Collection Time    07/25/13  4:15 PM      Result Value Ref Range Status   Specimen Description BLOOD RIGHT HAND   Final   Special Requests BOTTLES DRAWN AEROBIC ONLY 5CC   Final   Culture  Setup Time     Final   Value: 07/25/2013 19:23     Performed at [facility]   Culture     Final   Value:        BLOOD CULTURE RECEIVED NO GROWTH TO DATE CULTURE WILL BE HELD FOR 5 DAYS BEFORE ISSUING A FINAL NEGATIVE REPORT     Performed at [facility]   Report Status PENDING   Incomplete  MRSA PCR SCREENING     Status: None   Collection Time    07/25/13 11:42 PM      Result Value Ref Range Status   MRSA by PCR  NEGATIVE  NEGATIVE Final   Comment:            The GeneXpert MRSA Assay (FDA     approved for NASAL specimens     only), is one component of a     comprehensive MRSA colonization     surveillance program. It is not     intended to diagnose MRSA     infection nor to guide or     monitor treatment for     MRSA infections.    Anti-infectives   Start     Dose/Rate  Route Frequency Ordered Stop   07/26/13 2000  ceFEPIme (MAXIPIME) 1 g in dextrose 5 % 50 mL IVPB  Status:  Discontinued     1 g 100 mL/hr over 30 Minutes Intravenous Every 24 hours 07/25/13 1914 07/25/13 2017   07/26/13 1700  acyclovir (ZOVIRAX) 365 mg in dextrose 5 % 100 mL IVPB     5 mg/kg  73 kg 107.3 mL/hr over 60 Minutes Intravenous Every 24 hours 07/25/13 2146     07/25/13 2200  ceFEPIme (MAXIPIME) 1 g in dextrose 5 % 50 mL IVPB  Status:  Discontinued     1 g 100 mL/hr over 30 Minutes Intravenous 3 times per day 07/25/13 1952 07/25/13 1954   07/25/13 2200  acyclovir (ZOVIRAX) 5 mg/kg in dextrose 5 % 250 mL IVPB  Status:  Discontinued     5 mg/kg 250 mL/hr over 60 Minutes Intravenous 3 times per day 07/25/13 2009 07/25/13 2013   07/25/13 2200  vancomycin (VANCOCIN) 500 mg in sodium chloride 0.9 % 100 mL IVPB     500 mg 100 mL/hr over 60 Minutes Intravenous  Once 07/25/13 2145 07/26/13 0048   07/25/13 1915  ceFEPIme (MAXIPIME) 2 g in dextrose 5 % 50 mL IVPB     2 g 100 mL/hr over 30 Minutes Intravenous  Once 07/25/13 1856 07/26/13 0100   07/25/13 1900  vancomycin (VANCOCIN) IVPB 1000 mg/200 mL premix     1,000 mg 200 mL/hr over 60 Minutes Intravenous  Once 07/25/13 1856 07/25/13 2006   07/25/13 1830  cefTRIAXone (ROCEPHIN) 1 g in dextrose 5 % 50 mL IVPB  Status:  Discontinued     1 g 100 mL/hr over 30 Minutes Intravenous  Once 07/25/13 1823 07/26/13 0019   07/25/13 1600  acyclovir (ZOVIRAX) 700 mg in dextrose 5 % 100 mL IVPB     700 mg 114 mL/hr over 60 Minutes Intravenous  Once 07/25/13 1548 07/25/13 1817      Assessment: 56 YOF with ESRD who continues on Vancomycin + Cefepime for r/o HCAP in the setting of a recent admission in June and admission with SOB and CXR with possible RLL PNA. The patient is noted to have missed the past two HD sessions (Fri/Mon) and is off the HD schedule. Per renal, the plan is to receive dialysis today and tomorrow to get back on the patient's home  HD schedule. The patient was loaded with Vanc 1.5g and Cefepime 2g on 7/13 in the [facility]. Will schedule doses appropriately with new HD schedule.   The patient also continues on Acyclovir IV for empiric shingles coverage. Per discussion with the MD today, will transition to Valtrex po.  Goal of Therapy:  Pre-HD Vancomycin level of 15-25 mcg/ml [place] antibiotics for infection/cultures adjusted for renal/hepatic function   Plan:  1. Vancomycin 750 mg post HD on 7/14 2. Start Vancomycin 750 mg post HD-MWF starting on 7/15 3. Cefepime  1g post HD on 7/14 4. Start Cefepime 2g post HD-MWF starting on 7/15 5. D/c Acyclovir and change to Valtrex 500 mg po daily at bedtime 6. Will continue to follow HD schedule/duration, culture results, LOT, and antibiotic de-escalation plans   Alycia Rossetti, PharmD, BCPS Clinical Pharmacist Pager: [phone] 07/26/2013 11:00 AM    :

## 2013-07-26 NOTE — Consult Note (Signed)
Renal Service Consult Note [facility]  Jaclyn Morgan 07/26/2013 Roney Jaffe D Requesting Physician: Dr Ernestina Patches  Reason for Consult:  Recent start to HD with gen weakness and shingles HPI: The patient is a 80 y.o. year-old with hx of HTN and CKD who recently started HD about 2-3 weeks ago.  She has had about 5-6 sessions according to patient.  She missed her last two HD sessions and presented to ED yesterday with c/o gen weakness.  She was found to have a shingles rash with active blistering on the left chest.  She was admitted.   Today she is confused moderately and repeats her answers.  No sig sob, CP, n/v/d, no abd pain.  No jt pain or HA  Past Medical History  Past Medical History  Diagnosis Date  . Anemia   . Hypertension   . Hyperlipidemia   . Chronic kidney disease   . Hypothyroidism   . End-stage renal disease (ESRD)   . Urinary incontinence, functional   . Anxiety    Past Surgical History  Past Surgical History  Procedure Laterality Date  . Cataract extraction  06/07/2010  . Transthoracic echocardiogram  10/2009    EF=>55%, mild conc LVH; LA mildly dilated; mild mitral annular calcif, mild MR; mild TR, RVSP 40-57mmHg; AV mildly sclerotic; mild pulm valve regurg; aortic root sclerosis/calcif   . Nm myocar perf wall motion  10/2009    dipyridamole; mild-mod ischemia in apical anterior, basal inferolateral, mid inferoalteral region; post-stress EF 66%; high risk   . Av fistula placement Right 07/07/2013    Procedure: ARTERIOVENOUS (AV) FISTULA CREATION ;  Surgeon: Mal Misty, MD;  Location: [facility] OR;  Service: Vascular;  Laterality: Right;   Family History  Family History  Problem Relation Age of Onset  . Hyperlipidemia Other   . Hypertension Other   . Kidney disease Other   . Diabetes Other   . Hypertension Mother   . Diabetes Daughter   . Hypertension Daughter   . Cancer Son   . Cancer Sister    Social History  reports that she has never  smoked. She has never used smokeless tobacco. She reports that she does not drink alcohol or use illicit drugs. Allergies  Allergies  Allergen Reactions  . Norvasc [Amlodipine Besylate] Nausea Only  . Zocor [Simvastatin] Other (See Comments)    Kidney Issues?   Home medications Prior to Admission medications   Medication Sig Start Date End Date Taking? Authorizing Provider  acetaminophen (TYLENOL) 500 MG tablet Take 1,000 mg by mouth 2 (two) times daily as needed (pain).   Yes Historical Provider, MD  aspirin (BAYER ASPIRIN) 325 MG tablet Take 325 mg by mouth daily.     Yes Historical Provider, MD  calcitRIOL (ROCALTROL) 0.25 MCG capsule Take 0.25 mcg by mouth every Monday, Wednesday, and Friday.    Yes Historical Provider, MD  cloNIDine (CATAPRES) 0.2 MG tablet Take 0.2 mg by mouth 2 (two) times daily.   Yes Historical Provider, MD  doxazosin (CARDURA) 2 MG tablet Take 4 mg by mouth 2 (two) times daily.    Yes Historical Provider, MD  ferrous sulfate 325 (65 FE) MG tablet Take 650 mg by mouth daily with breakfast.   Yes Historical Provider, MD  labetalol (NORMODYNE) 200 MG tablet Take 200 mg by mouth 3 (three) times daily.    Yes Historical Provider, MD  Multiple Vitamin (MULTIVITAMIN WITH MINERALS) TABS tablet Take 1 tablet by mouth daily.   Yes Historical  Provider, MD  rosuvastatin (CRESTOR) 20 MG tablet Take 20 mg by mouth every evening.   Yes Historical Provider, MD   Liver Function Tests  Recent Labs Lab 07/25/13 1610 07/26/13 0532  AST 39* 41*  ALT 11 11  ALKPHOS 86 75  BILITOT 0.3 0.3  PROT 5.7* 5.2*  ALBUMIN 2.1* 1.9*   No results found for this basename: LIPASE, AMYLASE,  in the last 168 hours CBC  Recent Labs Lab 07/25/13 1420 07/25/13 2019 07/26/13 0532  WBC 7.6 6.0 7.5  NEUTROABS 5.7  --  5.4  HGB 8.5* 8.4* 8.2*  HCT 26.9* 26.6* 25.8*  MCV 83.8 85.0 84.3  PLT 223 202 425   Basic Metabolic Panel  Recent Labs Lab 07/25/13 1420 07/25/13 1610  07/25/13 2019 07/26/13 0532  NA 138  --   --  139  K 3.8  --   --  4.0  CL 97  --   --  98  CO2 23  --   --  23  GLUCOSE 119*  --   --  110*  BUN 40*  --   --  45*  CREATININE 8.86*  --  8.91* 9.23*  CALCIUM 8.6  --   --  8.1*  PHOS  --  4.3  --   --     Filed Vitals:   07/25/13 2100 07/25/13 2130 07/25/13 2240 07/26/13 0500  BP: 175/77 143/70 195/94 151/72  Pulse: 57 56 62 60  Temp:   97.6 F (36.4 C) 98.4 F (36.9 C)  TempSrc:   Oral Oral  Resp: 20 16 18 18   Height:   5' (1.524 m)   Weight: 73 kg (160 lb 15 oz)  71.6 kg (157 lb 13.6 oz)   SpO2: 99% 96% 99% 98%   Exam: Shaky, moderately confused, responsive , , +myoclonic jerking, no distress No rash, cyanosis or gangrene Sclera anicteric, throat clear +JVD Chest bibasilar mild rales RRR no MRG Abd soft, NTND, no ascites Blistering rash L mid sternal wrapping around to back No joint effusions 1+ bilat pretib edema Neuro is nf, Ox 2, +asterixis  CXR no CHF, R IJ cath in place Phos 4.3, Ca 8.1, Hb 8.2  HD: [facility]WF [place] 4h    71.5kg   450/800   2/2.5 [place]   R IJ Cath (maturing LFA AVF)  Heparin 2000 Aranesp 40 ug q Wed, Hect 4 ug TIW  Assessment: 1 AMS / gen weakness- pt is uremic and has missed significant amount of dialysis; just started HD 2-3 weeks ago 2 Herpes zoster- L chest 3 ESRD on HD, recent start 4 Vol excess, mild-mod 5 HTN on 3 bp meds 6 Anemia cont aranesp 7 HPTH cont binder, IV vit D   Plan- HD today and tomorrow, dec volume, d/c cardura, decrease BP meds further with UF as indicated   Kelly Splinter MD (pgr) [phone]    (c) [phone] 07/26/2013, 8:32 AM

## 2013-07-26 NOTE — Plan of Care (Signed)
Problem: Consults Goal: Nutrition Consult-if indicated Outcome: Completed/Met Date Met:  07/26/13 Dietitian consult to assess nutritional status, and recommend supplements. Pt does like nepro, and consumed 100% mixed berry nepro following dialysis.

## 2013-07-26 NOTE — Progress Notes (Addendum)
Received call from lab. Pt has positive blood culture in anaerobic bottle growing gram + cocci and clusters.  Manya Silvas, RN Dr. Karleen Hampshire notified by text message.

## 2013-07-26 NOTE — Progress Notes (Addendum)
Pt arrived to unit alert and oriented, cx of mild pain in left upper chest and back localized to rash area. Pt placed on telemetry and bed alarm. Full assessment completed. Medications initiated. Will continue to monitor. Attempted to obtain wound culture, all blisters currently intact with no drainage noted.

## 2013-07-26 NOTE — Progress Notes (Signed)
[facility] PROGRESS NOTE  Jaclyn Morgan SWN:[id number] DOB: 09-01-1932 DOA: 07/25/2013 PCP: Scarlette Calico, MD Interim summary: This is a 80 y.o. year old female with significant past medical history of ESRD recently started on HD MWF, diastolic CHF, HTN, hx/o uremic encephalopathy s/p HD presenting with weakness, HCAP, rash. Pt noted to be overall poor historian, though pt reports missing dialysis today because of weakness. Pt states that she felt too weak to go to dialysis and she somehow ended up in ED. She was admitted to [facility] for further evaluation. She was started on broad spectrum antibiotics for possible HCAP.   Assessment/Plan: 1. Generalized weakness/ hcap: - pan cultures on broad spectrum antibiotics.   2. ESRD on HD: Renal on board.   3. Left chest wall rash? Herpes: on anti virals.   4. Anemia: chronic disease.   DVT prophylaxis.   Code Status: full code.  Family Communication: none at bedside. Disposition Plan: pending PT eval.    Consultants:  renal  Procedures:  HD  Antibiotics:  VANCO  CEFEPIME  HPI/Subjective: No new complaints.   Objective: Filed Vitals:   07/26/13 1656  BP: 185/102  Pulse: 66  Temp: 98.2 F (36.8 C)  Resp: 16    Intake/Output Summary (Last 24 hours) at 07/26/13 1739 Last data filed at 07/26/13 1515  Gross per 24 hour  Intake    473 ml  Output   2288 ml  Net  -1815 ml   Filed Weights   07/25/13 2240 07/26/13 1018 07/26/13 1410  Weight: 71.6 kg (157 lb 13.6 oz) 72.5 kg (159 lb 13.3 oz) 69.9 kg (154 lb 1.6 oz)    Exam:   General:  Alert afebrile comfortable  Cardiovascular: s1s2  Respiratory: diminished air entry at bases.   Abdomen: soft NT ND BS+  Musculoskeletal: pedal edema.   Data Reviewed: Basic Metabolic Panel:  Recent Labs Lab 07/25/13 1420 07/25/13 1610 07/25/13 2019 07/26/13 0532  NA 138  --   --  139  K 3.8  --   --  4.0  CL 97  --   --  98  CO2 23  --   --  23  GLUCOSE  119*  --   --  110*  BUN 40*  --   --  45*  CREATININE 8.86*  --  8.91* 9.23*  CALCIUM 8.6  --   --  8.1*  MG  --  2.1  --   --   PHOS  --  4.3  --   --    Liver Function Tests:  Recent Labs Lab 07/25/13 1610 07/26/13 0532  AST 39* 41*  ALT 11 11  ALKPHOS 86 75  BILITOT 0.3 0.3  PROT 5.7* 5.2*  ALBUMIN 2.1* 1.9*   No results found for this basename: LIPASE, AMYLASE,  in the last 168 hours No results found for this basename: AMMONIA,  in the last 168 hours CBC:  Recent Labs Lab 07/25/13 1420 07/25/13 2019 07/26/13 0532  WBC 7.6 6.0 7.5  NEUTROABS 5.7  --  5.4  HGB 8.5* 8.4* 8.2*  HCT 26.9* 26.6* 25.8*  MCV 83.8 85.0 84.3  PLT 223 202 227   Cardiac Enzymes:  Recent Labs Lab 07/25/13 1610 07/25/13 2300 07/26/13 0532 07/26/13 0755  TROPONINI <0.30 <0.30 <0.30 <0.30   BNP (last 3 results)  Recent Labs  07/01/13 2055 07/25/13 1610  PROBNP >70000.0* >70000.0*   CBG:  Recent Labs Lab 07/26/13 0754  GLUCAP 92  Recent Results (from the past 240 hour(s))  CULTURE, BLOOD (ROUTINE X 2)     Status: None   Collection Time    07/25/13  4:10 PM      Result Value Ref Range Status   Specimen Description BLOOD RIGHT ARM   Final   Special Requests BOTTLES DRAWN AEROBIC AND ANAEROBIC 5CC   Final   Culture  Setup Time     Final   Value: 07/25/2013 19:22     Performed at [facility]   Culture     Final   Value:        BLOOD CULTURE RECEIVED NO GROWTH TO DATE CULTURE WILL BE HELD FOR 5 DAYS BEFORE ISSUING A FINAL NEGATIVE REPORT     Performed at [facility]   Report Status PENDING   Incomplete  CULTURE, BLOOD (ROUTINE X 2)     Status: None   Collection Time    07/25/13  4:15 PM      Result Value Ref Range Status   Specimen Description BLOOD RIGHT HAND   Final   Special Requests BOTTLES DRAWN AEROBIC ONLY 5CC   Final   Culture  Setup Time     Final   Value: 07/25/2013 19:23     Performed at [facility]   Culture     Final    Value:        BLOOD CULTURE RECEIVED NO GROWTH TO DATE CULTURE WILL BE HELD FOR 5 DAYS BEFORE ISSUING A FINAL NEGATIVE REPORT     Performed at [facility]   Report Status PENDING   Incomplete  URINE CULTURE     Status: None   Collection Time    07/25/13  4:55 PM      Result Value Ref Range Status   Specimen Description URINE, RANDOM   Final   Special Requests NONE   Final   Culture  Setup Time     Final   Value: 07/25/2013 17:20     Performed at [place] PENDING   Incomplete   Culture     Final   Value: Culture reincubated for better growth     Performed at [facility]   Report Status PENDING   Incomplete  MRSA PCR SCREENING     Status: None   Collection Time    07/25/13 11:42 PM      Result Value Ref Range Status   MRSA by PCR NEGATIVE  NEGATIVE Final   Comment:            The GeneXpert MRSA Assay (FDA     approved for NASAL specimens     only), is one component of a     comprehensive MRSA colonization     surveillance program. It is not     intended to diagnose MRSA     infection nor to guide or     monitor treatment for     MRSA infections.     Studies: Dg Chest Port 1 View  07/25/2013   CLINICAL DATA:  Weakness.  EXAM: PORTABLE CHEST - 1 VIEW  COMPARISON:  07/02/2013.  FINDINGS: Dialysis catheter in stable position. Interval removal of left IJ line. Mediastinum and hilar structures are unremarkable. Stable cardiomegaly. Mild basilar atelectasis. Mild infiltrate right lung base cannot be excluded. No significant pleural effusion or pneumothorax.  IMPRESSION: 1. Interval removal of left IJ line. Dialysis catheter in stable position. 2. Stable cardiomegaly. 3. Mild  basilar atelectasis. Mild pneumonia right lung base cannot be excluded.   Electronically Signed   By: Marcello Moores  Register   On: 07/25/2013 14:29    Scheduled Meds: . aspirin  325 mg Oral Daily  . atorvastatin  40 mg Oral q1800  . ceFEPime (MAXIPIME) IV  1 g Intravenous  Once  . [START ON 07/27/2013] ceFEPime (MAXIPIME) IV  2 g Intravenous Q M,W,F-2000  . cloNIDine  0.2 mg Oral BID  . ferrous sulfate  650 mg Oral Q breakfast  . heparin  5,000 Units Subcutaneous 3 times per day  . labetalol  200 mg Oral TID  . methylPREDNISolone (SOLU-MEDROL) injection  125 mg Intravenous Q12H  . multivitamin with minerals  1 tablet Oral Daily  . sodium chloride  3 mL Intravenous Q12H  . valACYclovir  500 mg Oral QHS  . [START ON 07/27/2013] vancomycin  750 mg Intravenous Q M,W,F-HD   Continuous Infusions:   Active Problems:   ESRD (end stage renal disease) on dialysis   Weakness   HCAP (healthcare-associated pneumonia)   Rash    Time spent: 35 minutes.     [facility] Pager [phone]. If 7PM-7AM, please contact night-coverage at [email or web address], password [facility] 07/26/2013, 5:39 PM  LOS: 1 day

## 2013-07-26 NOTE — Progress Notes (Signed)
Following hemodialysis, pt was assisted with medication. Noted difficulty using a straw, and swallowing pills. Pt was able to drink from a cup without any difficulty. Pt did not have any trouble in the morning during breakfast. Possible fatigue vs uremia vs ??. Discussed with Dr. Jonnie Finner. Bedside swallowing evaluation ordered. About an hour later, pt presented with brighter affect and talkative. Will continue to monitor. Manya Silvas, RN

## 2013-07-27 DIAGNOSIS — G9341 Metabolic encephalopathy: Secondary | ICD-10-CM

## 2013-07-27 DIAGNOSIS — N390 Urinary tract infection, site not specified: Secondary | ICD-10-CM | POA: Diagnosis present

## 2013-07-27 DIAGNOSIS — N186 End stage renal disease: Secondary | ICD-10-CM

## 2013-07-27 LAB — CBC WITH DIFFERENTIAL/PLATELET
Basophils Absolute: 0 10*3/uL (ref 0.0–0.1)
Basophils Relative: 0 % (ref 0–1)
EOS PCT: 0 % (ref 0–5)
Eosinophils Absolute: 0 10*3/uL (ref 0.0–0.7)
HEMATOCRIT: 27.8 % — AB (ref 36.0–46.0)
Hemoglobin: 9 g/dL — ABNORMAL LOW (ref 12.0–15.0)
LYMPHS ABS: 0.7 10*3/uL (ref 0.7–4.0)
LYMPHS PCT: 15 % (ref 12–46)
MCH: 26.6 pg (ref 26.0–34.0)
MCHC: 32.4 g/dL (ref 30.0–36.0)
MCV: 82.2 fL (ref 78.0–100.0)
MONO ABS: 0.3 10*3/uL (ref 0.1–1.0)
Monocytes Relative: 6 % (ref 3–12)
Neutro Abs: 3.6 10*3/uL (ref 1.7–7.7)
Neutrophils Relative %: 79 % — ABNORMAL HIGH (ref 43–77)
Platelets: 236 10*3/uL (ref 150–400)
RBC: 3.38 MIL/uL — AB (ref 3.87–5.11)
RDW: 14.5 % (ref 11.5–15.5)
WBC: 4.5 10*3/uL (ref 4.0–10.5)

## 2013-07-27 LAB — COMPREHENSIVE METABOLIC PANEL
ALBUMIN: 2.1 g/dL — AB (ref 3.5–5.2)
ALT: 12 U/L (ref 0–35)
AST: 42 U/L — AB (ref 0–37)
Alkaline Phosphatase: 89 U/L (ref 39–117)
Anion gap: 17 — ABNORMAL HIGH (ref 5–15)
BILIRUBIN TOTAL: 0.2 mg/dL — AB (ref 0.3–1.2)
BUN: 23 mg/dL (ref 6–23)
CALCIUM: 8.1 mg/dL — AB (ref 8.4–10.5)
CHLORIDE: 92 meq/L — AB (ref 96–112)
CO2: 25 mEq/L (ref 19–32)
CREATININE: 5.07 mg/dL — AB (ref 0.50–1.10)
GFR calc Af Amer: 9 mL/min — ABNORMAL LOW (ref >90)
GFR calc non Af Amer: 7 mL/min — ABNORMAL LOW (ref >90)
Glucose, Bld: 209 mg/dL — ABNORMAL HIGH (ref 70–99)
Potassium: 4.3 mEq/L (ref 3.7–5.3)
Sodium: 134 mEq/L — ABNORMAL LOW (ref 137–147)
TOTAL PROTEIN: 6 g/dL (ref 6.0–8.3)

## 2013-07-27 LAB — GLUCOSE, CAPILLARY: Glucose-Capillary: 176 mg/dL — ABNORMAL HIGH (ref 70–99)

## 2013-07-27 MED ORDER — ACETAMINOPHEN 325 MG PO TABS: 650.0000 mg | ORAL_TABLET | Freq: Four times a day (QID) | ORAL | Status: DC | PRN

## 2013-07-27 MED ORDER — LABETALOL HCL 200 MG PO TABS: 200.0000 mg | ORAL_TABLET | Freq: Three times a day (TID) | ORAL | Status: DC

## 2013-07-27 MED ORDER — DOXERCALCIFEROL 4 MCG/2ML IV SOLN
4.0000 ug | INTRAVENOUS | Status: DC
  Administered 2013-07-29: 4 ug via INTRAVENOUS
  Filled 2013-07-27 (×2): qty 2

## 2013-07-27 MED ORDER — DEXTROSE 5 % IV SOLN
1.0000 g | INTRAVENOUS | Status: DC
  Administered 2013-07-27: 1 g via INTRAVENOUS
  Filled 2013-07-27 (×2): qty 10

## 2013-07-27 MED ORDER — NEPRO/CARBSTEADY PO LIQD
237.0000 mL | Freq: Two times a day (BID) | ORAL | Status: DC
  Administered 2013-07-27 – 2013-07-31 (×7): 237 mL via ORAL

## 2013-07-27 MED ORDER — RENA-VITE PO TABS
1.0000 | ORAL_TABLET | Freq: Every day | ORAL | Status: DC
  Administered 2013-07-28 – 2013-07-31 (×5): 1 via ORAL
  Filled 2013-07-27 (×6): qty 1

## 2013-07-27 MED ORDER — DARBEPOETIN ALFA-POLYSORBATE 40 MCG/0.4ML IJ SOLN
INTRAMUSCULAR | Status: AC
  Administered 2013-07-27: 40 ug
  Filled 2013-07-27: qty 0.4

## 2013-07-27 MED ORDER — DARBEPOETIN ALFA-POLYSORBATE 40 MCG/0.4ML IJ SOLN: 40.0000 ug | INTRAMUSCULAR | Status: DC

## 2013-07-27 MED ORDER — DOXERCALCIFEROL 4 MCG/2ML IV SOLN
INTRAVENOUS | Status: AC
  Administered 2013-07-27: 4 ug
  Filled 2013-07-27: qty 2

## 2013-07-27 NOTE — Evaluation (Signed)
Clinical/Bedside Swallow Evaluation Patient Details  Name: BAILLEY GUILFORD MRN: [id number] Date of Birth: November 09, 1932  Today's Date: 07/27/2013 Time: 0825-0834 SLP Time Calculation (min): 9 min  Past Medical History:  Past Medical History  Diagnosis Date  . Anemia   . Hypertension   . Hyperlipidemia   . Chronic kidney disease   . Hypothyroidism   . End-stage renal disease (ESRD)   . Urinary incontinence, functional   . Anxiety    Past Surgical History:  Past Surgical History  Procedure Laterality Date  . Cataract extraction  06/07/2010  . Transthoracic echocardiogram  10/2009    EF=>55%, mild conc LVH; LA mildly dilated; mild mitral annular calcif, mild MR; mild TR, RVSP 40-19mmHg; AV mildly sclerotic; mild pulm valve regurg; aortic root sclerosis/calcif   . Nm myocar perf wall motion  10/2009    dipyridamole; mild-mod ischemia in apical anterior, basal inferolateral, mid inferoalteral region; post-stress EF 66%; high risk   . Av fistula placement Right 07/07/2013    Procedure: ARTERIOVENOUS (AV) FISTULA CREATION ;  Surgeon: Mal Misty, MD;  Location: [facility] OR;  Service: Vascular;  Laterality: Right;   HPI:  80 y.o. year old female with past medical history of ESRD,  diastolic CHF, HTN, hx/o uremic encephalopathy, UTI presenting with weakness, HCAP, rash.  Chest x-ray shows stable cardiomegaly and mild pneumonia on the right lung base cannot be excluded.    Assessment / Plan / Recommendation Clinical Impression  Arrived for swallow assessment while RN tech feeding pt. breakfast.  Tech reports pt. needs feeding assist due to pt. holding utensil without bringing toward mouth.  No oropharyngeal impairments observed or suspected.  No neurological or significant respiratory history to increase aspiration risk (low risk at this time).  Continue regular diet texture and thin liquids; no ST follow up needed.      Aspiration Risk  Mild    Diet Recommendation Regular;Thin liquid    Liquid Administration via: Cup;Straw Medication Administration: Whole meds with liquid Supervision: Patient able to self feed;Intermittent supervision to cue for compensatory strategies Compensations: Slow rate;Small sips/bites Postural Changes and/or Swallow Maneuvers: Seated upright 90 degrees    Other  Recommendations Oral Care Recommendations: Oral care BID   Follow Up Recommendations  None    Frequency and Duration        Pertinent Vitals/Pain WDL         Swallow Study         Oral/Motor/Sensory Function Overall Oral Motor/Sensory Function: Appears within functional limits for tasks assessed   Ice Chips Ice chips: Not tested   Thin Liquid Thin Liquid: Within functional limits Presentation: Straw    Nectar Thick Nectar Thick Liquid: Not tested   Honey Thick Honey Thick Liquid: Not tested   Puree Puree: Not tested   Solid   GO    Solid: Within functional limits       Orbie Pyo [employer] [phone]  07/27/2013

## 2013-07-27 NOTE — Progress Notes (Addendum)
Subjective:   Feeling much better.  Objective Filed Vitals:   07/26/13 1656 07/26/13 2100 07/27/13 0645 07/27/13 1058  BP: 185/102 154/87 185/74 189/74  Pulse: 66 68 64 60  Temp: 98.2 F (36.8 C) 98.4 F (36.9 C) 98.7 F (37.1 C) 98.2 F (36.8 C)  TempSrc: Oral Oral Oral Oral  Resp: 16 18 18 18   Height:      Weight:  68.9 kg (151 lb 14.4 oz)    SpO2: 100% 100% 96% 100%   Physical Exam General: alert, pleasant, confused. No acute distress. Still confused "[place]", "20.....", perseverating Heart: RRR no murmur Lungs: CTA,  Abdomen: soft, nontender +BS. L chest blistering herpes zoster rash Extremities: trace LE edema Dialysis Access: R IJ cath. maturing L AVF +bruit/thrill   HD: MWF [place]  4h 71.5kg 450/800 2/2.5 Bath R IJ Cath (maturing LFA AVF) Heparin 2000  Aranesp 40 ug q Wed, Hect 4 ug TIW  Assessment/Plan: 1. AMS- uremic s/p missing outpt HD, also UTI. Reports weakness improving HD again today, if not back to baseline MS then would look for other causes 2. ESRD -MWF @ [place], HD pending today. K+4.3 3. Anemia - hgb 9. Cont esa 4. Secondary hyperparathyroidism - ca+ 8.1/9.6. Cont hectorol  5. HTN/volume - 189/74. under edw, lower at DC. Clonidine and labetalol 6. Nutrition - alb 2.1. Renal diet. Nepro, multi vit 7. Herpes zoster- Valacyclovir stop date 7/19 At renal dose 8. UTI- ecoli. On rocephin   Shelle Iron, NP [place] [phone] 07/27/2013,12:20 PM  LOS: 2 days   Pt seen, examined, agree w assess/plan as above with additions as indicated.  Kelly Splinter MD pager [phone]    cell [phone] 07/27/2013, 1:20 PM       Additional Objective Labs: Basic Metabolic Panel:  Recent Labs Lab 07/25/13 1420 07/25/13 1610 07/25/13 2019 07/26/13 0532 07/27/13 0350  NA 138  --   --  139 134*  K 3.8  --   --  4.0 4.3  CL 97  --   --  98 92*  CO2 23  --   --  23 25  GLUCOSE 119*  --   --  110* 209*  BUN 40*  --   --  45* 23   CREATININE 8.86*  --  8.91* 9.23* 5.07*  CALCIUM 8.6  --   --  8.1* 8.1*  PHOS  --  4.3  --   --   --    Liver Function Tests:  Recent Labs Lab 07/25/13 1610 07/26/13 0532 07/27/13 0350  AST 39* 41* 42*  ALT 11 11 12   ALKPHOS 86 75 89  BILITOT 0.3 0.3 0.2*  PROT 5.7* 5.2* 6.0  ALBUMIN 2.1* 1.9* 2.1*   No results found for this basename: LIPASE, AMYLASE,  in the last 168 hours CBC:  Recent Labs Lab 07/25/13 1420 07/25/13 2019 07/26/13 0532 07/27/13 0350  WBC 7.6 6.0 7.5 4.5  NEUTROABS 5.7  --  5.4 3.6  HGB 8.5* 8.4* 8.2* 9.0*  HCT 26.9* 26.6* 25.8* 27.8*  MCV 83.8 85.0 84.3 82.2  PLT 223 202 227 236   Blood Culture    Component Value Date/Time   SDES URINE, RANDOM 07/25/2013 1655   SPECREQUEST NONE 07/25/2013 1655   CULT  Value: ESCHERICHIA COLI Performed at [facility] 07/25/2013 1655   REPTSTATUS PENDING 07/25/2013 1655    Cardiac Enzymes:  Recent Labs Lab 07/25/13 1610 07/25/13 2300 07/26/13 0532 07/26/13 0755  TROPONINI <0.30 <0.30 <0.30 <  0.30   CBG:  Recent Labs Lab 07/26/13 0754 07/27/13 0801  GLUCAP 92 176*   Iron Studies: No results found for this basename: IRON, TIBC, TRANSFERRIN, FERRITIN,  in the last 72 hours @lablastinr3 @ Studies/Results: Dg Chest Port 1 View  07/25/2013   CLINICAL DATA:  Weakness.  EXAM: PORTABLE CHEST - 1 VIEW  COMPARISON:  07/02/2013.  FINDINGS: Dialysis catheter in stable position. Interval removal of left IJ line. Mediastinum and hilar structures are unremarkable. Stable cardiomegaly. Mild basilar atelectasis. Mild infiltrate right lung base cannot be excluded. No significant pleural effusion or pneumothorax.  IMPRESSION: 1. Interval removal of left IJ line. Dialysis catheter in stable position. 2. Stable cardiomegaly. 3. Mild basilar atelectasis. Mild pneumonia right lung base cannot be excluded.   Electronically Signed   By: Marcello Moores  Register   On: 07/25/2013 14:29   Medications:   . aspirin  325 mg Oral  Daily  . atorvastatin  40 mg Oral q1800  . cefTRIAXone (ROCEPHIN)  IV  1 g Intravenous Q24H  . cloNIDine  0.2 mg Oral BID  . feeding supplement (NEPRO CARB STEADY)  237 mL Oral BID BM  . ferrous sulfate  650 mg Oral Q breakfast  . heparin  5,000 Units Subcutaneous 3 times per day  . labetalol  200 mg Oral TID  . multivitamin with minerals  1 tablet Oral Daily  . sodium chloride  3 mL Intravenous Q12H  . valACYclovir  500 mg Oral QHS

## 2013-07-27 NOTE — Progress Notes (Signed)
INITIAL NUTRITION ASSESSMENT  DOCUMENTATION CODES Per approved criteria  -Not Applicable   INTERVENTION: Add Nepro Shake po BID, each supplement provides 425 kcal and 19 grams protein. RD to continue to follow nutrition care plan.  NUTRITION DIAGNOSIS: Inadequate oral intake related to poor appetite as evidenced by limited po intake.   Goal: Intake to meet >90% of estimated nutrition needs.  Monitor:  weight trends, lab trends, I/O's, PO intake, supplement tolerance  Reason for Assessment: RN Consult for Supplement Choices  80 y.o. female  Admitting Dx: shingles  ASSESSMENT: PMHx significant for ESRD on HD, CHF, HTN. Admitted with HCAP, weakness and rash. Work-up reveals shingles.  Dry weight per renal is 71.5 kg. Most recent dry weight from last night is 68.9 kg. Pt somewhat confused on exam. She reports that she has been trying to lose weight by eating less. Consumed 10% of breakfast. RN from yesterday stated that pt consumed an entire Nepro Shake when it was offered - RD to add scheduled.  Pt presented uremic 2/2 missing "a significant amount of HD."  BSE completed by SLP on 7/15 with recommendations for Regular diet with thin liquids, pt is noted to need feeding assistance with bringing food towards mouth. Pt at nutrition risk, brief physical exam does not reveal any significant fat/muscle wasting.  Potassium WNL Phosphorus WNL  Height: Ht Readings from Last 1 Encounters:  07/25/13 5' (1.524 m)    Weight: Wt Readings from Last 1 Encounters:  07/26/13 151 lb 14.4 oz (68.9 kg)    Ideal Body Weight: 100 lb  % Ideal Body Weight: 151%  Wt Readings from Last 10 Encounters:  07/26/13 151 lb 14.4 oz (68.9 kg)  07/08/13 161 lb 6 oz (73.2 kg)  07/08/13 161 lb 6 oz (73.2 kg)  12/28/12 171 lb (77.565 kg)  12/23/12 171 lb (77.565 kg)  06/02/12 170 lb 11.2 oz (77.429 kg)  12/05/11 168 lb 9.6 oz (76.476 kg)  09/18/11 167 lb 12 oz (76.091 kg)  04/30/11 177 lb 12.8 oz  (80.65 kg)  01/21/11 174 lb (78.926 kg)    Usual Body Weight: 160 - 170 lb  % Usual Body Weight: 92%  BMI:  Body mass index is 29.67 kg/(m^2). Overweight  Estimated Nutritional Needs: Kcal: 1500 - 1700 Protein: 80 - 90 g Fluid: 1.2 liters  Skin:  R arm incision Neck puncture for R chest HD catheter  Diet Order: Renal  EDUCATION NEEDS: -No education needs identified at this time   Intake/Output Summary (Last 24 hours) at 07/27/13 1034 Last data filed at 07/26/13 1828  Gross per 24 hour  Intake    150 ml  Output   2288 ml  Net  -2138 ml    Last BM: 7/14  Labs:   Recent Labs Lab 07/25/13 1420 07/25/13 1610 07/25/13 2019 07/26/13 0532 07/27/13 0350  NA 138  --   --  139 134*  K 3.8  --   --  4.0 4.3  CL 97  --   --  98 92*  CO2 23  --   --  23 25  BUN 40*  --   --  45* 23  CREATININE 8.86*  --  8.91* 9.23* 5.07*  CALCIUM 8.6  --   --  8.1* 8.1*  MG  --  2.1  --   --   --   PHOS  --  4.3  --   --   --   GLUCOSE 119*  --   --  110* 209*    CBG (last 3)   Recent Labs  07/26/13 0754 07/27/13 0801  GLUCAP 92 176*    Scheduled Meds: . aspirin  325 mg Oral Daily  . atorvastatin  40 mg Oral q1800  . ceFEPime (MAXIPIME) IV  2 g Intravenous Q M,W,F-2000  . cloNIDine  0.2 mg Oral BID  . ferrous sulfate  650 mg Oral Q breakfast  . heparin  5,000 Units Subcutaneous 3 times per day  . labetalol  200 mg Oral TID  . methylPREDNISolone (SOLU-MEDROL) injection  125 mg Intravenous Q12H  . multivitamin with minerals  1 tablet Oral Daily  . sodium chloride  3 mL Intravenous Q12H  . valACYclovir  500 mg Oral QHS  . vancomycin  750 mg Intravenous Q M,W,F-HD    Continuous Infusions:  none  Past Medical History  Diagnosis Date  . Anemia   . Hypertension   . Hyperlipidemia   . Chronic kidney disease   . Hypothyroidism   . End-stage renal disease (ESRD)   . Urinary incontinence, functional   . Anxiety     Past Surgical History  Procedure Laterality  Date  . Cataract extraction  06/07/2010  . Transthoracic echocardiogram  10/2009    EF=>55%, mild conc LVH; LA mildly dilated; mild mitral annular calcif, mild MR; mild TR, RVSP 40-28mmHg; AV mildly sclerotic; mild pulm valve regurg; aortic root sclerosis/calcif   . Nm myocar perf wall motion  10/2009    dipyridamole; mild-mod ischemia in apical anterior, basal inferolateral, mid inferoalteral region; post-stress EF 66%; high risk   . Av fistula placement Right 07/07/2013    Procedure: ARTERIOVENOUS (AV) FISTULA CREATION ;  Surgeon: Mal Misty, MD;  Location: [place];  Service: Vascular;  Laterality: Right;    Inda Coke MS, RD, LDN Inpatient Registered Dietitian Pager: [phone] After-hours pager: [phone]

## 2013-07-27 NOTE — Progress Notes (Signed)
[facility] Progress Note   Jaclyn Morgan FBP:[id number] DOB: 26-May-1932 DOA: 07/25/2013 PCP: Scarlette Calico, MD  Brief narrative: Jaclyn Morgan is a 80 y.o. female with PMH of ESRD new start on HD about 3 wks ago, HTN, dementia, hypothyroid presenting with generalized weakness. She had missed the past 2 dialysis sessions and was quite confused on admission. She was also found to have shingles on her left anterior chest wall.  It is noted that she was treated for HCAP with Vanc and Cefepime- no signs of pneumonia, including no cough, fever, leukocytosis or dyspnea. She did have significantly postive UA on admission   Subjective: Confused. No c/o pain- noted that she has not eaten any breakfast - tray at bedside.   Assessment/Plan: Principal Problem:   Metabolic encephalopathy - likely from missed dialysis, UTI, steroids and potentially also hospital acquired delirium - follow  Active Problems:   Urinary tract infection, site not specified - growing E coli- f/u sensitivities d/c Vanc- switch Cefepime to Rocephin  HCAP?? - no signs or symptoms of this- d/c Vanc and Cefepime as mentioned above  Varicella Zoster rash - switch Acyclovir to Valacyclovir- total 7 days- stop date 7/19 - has been on 125 mg of Solumedrol BID- will d/c as literature does not support its use in this situation    Essential hypertension, benign - cont clonidine- Cardura d/c'd by renal team- resume Labetalol today as SBP >180    Unspecified hypothyroidism - not on any medications- repeat TSH and free T4    ESRD (end stage renal disease) on dialysis - per nephrology team    Weakness - no PT ordered- has not been out of bed since admission- will order   Code Status: Full code Family Communication: none Disposition Plan: daughter unable to take care of her at home- requesting SNF  Consultants: Nephrology  Procedures: none  Antibiotics: Anti-infectives   Start     Dose/Rate Route Frequency  Ordered Stop   07/27/13 2000  ceFEPIme (MAXIPIME) 1 g in dextrose 5 % 50 mL IVPB  Status:  Discontinued     1 g 100 mL/hr over 30 Minutes Intravenous Every M-W-F (2000) 07/26/13 1107 07/26/13 1111   07/27/13 2000  ceFEPIme (MAXIPIME) 2 g in dextrose 5 % 50 mL IVPB     2 g 100 mL/hr over 30 Minutes Intravenous Every M-W-F (2000) 07/26/13 1111     07/27/13 1200  vancomycin (VANCOCIN) IVPB 750 mg/150 ml premix     750 mg 150 mL/hr over 60 Minutes Intravenous Every M-W-F (Hemodialysis) 07/26/13 1107     07/26/13 2200  valACYclovir (VALTREX) tablet 500 mg     500 mg Oral Daily at bedtime 07/26/13 1148     07/26/13 2000  ceFEPIme (MAXIPIME) 1 g in dextrose 5 % 50 mL IVPB  Status:  Discontinued     1 g 100 mL/hr over 30 Minutes Intravenous Every 24 hours 07/25/13 1914 07/25/13 2017   07/26/13 2000  ceFEPIme (MAXIPIME) 1 g in dextrose 5 % 50 mL IVPB     1 g 100 mL/hr over 30 Minutes Intravenous  Once 07/26/13 1107 07/26/13 2106   07/26/13 1700  acyclovir (ZOVIRAX) 365 mg in dextrose 5 % 100 mL IVPB  Status:  Discontinued     5 mg/kg  73 kg 107.3 mL/hr over 60 Minutes Intravenous Every 24 hours 07/25/13 2146 07/26/13 1148   07/26/13 1200  vancomycin (VANCOCIN) IVPB 750 mg/150 ml premix     750 mg 150  mL/hr over 60 Minutes Intravenous Every Tue (Hemodialysis) 07/26/13 1107 07/26/13 1615   07/25/13 2200  ceFEPIme (MAXIPIME) 1 g in dextrose 5 % 50 mL IVPB  Status:  Discontinued     1 g 100 mL/hr over 30 Minutes Intravenous 3 times per day 07/25/13 1952 07/25/13 1954   07/25/13 2200  acyclovir (ZOVIRAX) 5 mg/kg in dextrose 5 % 250 mL IVPB  Status:  Discontinued     5 mg/kg 250 mL/hr over 60 Minutes Intravenous 3 times per day 07/25/13 2009 07/25/13 2013   07/25/13 2200  vancomycin (VANCOCIN) 500 mg in sodium chloride 0.9 % 100 mL IVPB     500 mg 100 mL/hr over 60 Minutes Intravenous  Once 07/25/13 2145 07/26/13 0048   07/25/13 1915  ceFEPIme (MAXIPIME) 2 g in dextrose 5 % 50 mL IVPB     2  g 100 mL/hr over 30 Minutes Intravenous  Once 07/25/13 1856 07/26/13 0100   07/25/13 1900  vancomycin (VANCOCIN) IVPB 1000 mg/200 mL premix     1,000 mg 200 mL/hr over 60 Minutes Intravenous  Once 07/25/13 1856 07/25/13 2006   07/25/13 1830  cefTRIAXone (ROCEPHIN) 1 g in dextrose 5 % 50 mL IVPB  Status:  Discontinued     1 g 100 mL/hr over 30 Minutes Intravenous  Once 07/25/13 1823 07/26/13 0019   07/25/13 1600  acyclovir (ZOVIRAX) 700 mg in dextrose 5 % 100 mL IVPB     700 mg 114 mL/hr over 60 Minutes Intravenous  Once 07/25/13 1548 07/25/13 1817       DVT prophylaxis: heparin  Objective: Filed Weights   07/26/13 1018 07/26/13 1410 07/26/13 2100  Weight: 72.5 kg (159 lb 13.3 oz) 69.9 kg (154 lb 1.6 oz) 68.9 kg (151 lb 14.4 oz)    Vitals Filed Vitals:   07/26/13 1656 07/26/13 2100 07/27/13 0645 07/27/13 1058  BP: 185/102 154/87 185/74 189/74  Pulse: 66 68 64 60  Temp: 98.2 F (36.8 C) 98.4 F (36.9 C) 98.7 F (37.1 C) 98.2 F (36.8 C)  TempSrc: Oral Oral Oral Oral  Resp: 16 18 18 18   Height:      Weight:  68.9 kg (151 lb 14.4 oz)    SpO2: 100% 100% 96% 100%      Intake/Output Summary (Last 24 hours) at 07/27/13 1209 Last data filed at 07/27/13 0900  Gross per 24 hour  Intake    150 ml  Output   2288 ml  Net  -2138 ml     Exam: General: confused, no following all commands, No acute respiratory distress Lungs: will not take deep breaths when asked, decreased breath sounds Cardiovascular: Regular rate and rhythm without murmur gallop or rub normal S1 and S2 Abdomen: Nontender, nondistended, soft, bowel sounds positive, no rebound, no ascites, no appreciable mass Extremities: No significant cyanosis, clubbing- + edema bilateral lower extremities Skin: blisters on anterior chest wall starting at sternum and going to left chest wall- no open blisters yet- ares is not tender.   Data Reviewed: Basic Metabolic Panel:  Recent Labs Lab 07/25/13 1420  07/25/13 1610 07/25/13 2019 07/26/13 0532 07/27/13 0350  NA 138  --   --  139 134*  K 3.8  --   --  4.0 4.3  CL 97  --   --  98 92*  CO2 23  --   --  23 25  GLUCOSE 119*  --   --  110* 209*  BUN 40*  --   --  45* 23  CREATININE 8.86*  --  8.91* 9.23* 5.07*  CALCIUM 8.6  --   --  8.1* 8.1*  MG  --  2.1  --   --   --   PHOS  --  4.3  --   --   --    Liver Function Tests:  Recent Labs Lab 07/25/13 1610 07/26/13 0532 07/27/13 0350  AST 39* 41* 42*  ALT 11 11 12   ALKPHOS 86 75 89  BILITOT 0.3 0.3 0.2*  PROT 5.7* 5.2* 6.0  ALBUMIN 2.1* 1.9* 2.1*   No results found for this basename: LIPASE, AMYLASE,  in the last 168 hours No results found for this basename: AMMONIA,  in the last 168 hours CBC:  Recent Labs Lab 07/25/13 1420 07/25/13 2019 07/26/13 0532 07/27/13 0350  WBC 7.6 6.0 7.5 4.5  NEUTROABS 5.7  --  5.4 3.6  HGB 8.5* 8.4* 8.2* 9.0*  HCT 26.9* 26.6* 25.8* 27.8*  MCV 83.8 85.0 84.3 82.2  PLT 223 202 227 236   Cardiac Enzymes:  Recent Labs Lab 07/25/13 1610 07/25/13 2300 07/26/13 0532 07/26/13 0755  TROPONINI <0.30 <0.30 <0.30 <0.30   BNP (last 3 results)  Recent Labs  07/01/13 2055 07/25/13 1610  PROBNP >70000.0* >70000.0*   CBG:  Recent Labs Lab 07/26/13 0754 07/27/13 0801  GLUCAP 92 176*    Recent Results (from the past 240 hour(s))  CULTURE, BLOOD (ROUTINE X 2)     Status: None   Collection Time    07/25/13  4:10 PM      Result Value Ref Range Status   Specimen Description BLOOD RIGHT ARM   Final   Special Requests BOTTLES DRAWN AEROBIC AND ANAEROBIC 5CC   Final   Culture  Setup Time     Final   Value: 07/25/2013 19:22     Performed at [facility]   Culture     Final   Value: GRAM POSITIVE COCCI IN CLUSTERS     Note: Gram Stain Report Called to,Read Back By and Verified With: [facility] RN 71P     Performed at [facility]   Report Status PENDING   Incomplete  CULTURE, BLOOD (ROUTINE X 2)     Status: None    Collection Time    07/25/13  4:15 PM      Result Value Ref Range Status   Specimen Description BLOOD RIGHT HAND   Final   Special Requests BOTTLES DRAWN AEROBIC ONLY 5CC   Final   Culture  Setup Time     Final   Value: 07/25/2013 19:23     Performed at [facility]   Culture     Final   Value:        BLOOD CULTURE RECEIVED NO GROWTH TO DATE CULTURE WILL BE HELD FOR 5 DAYS BEFORE ISSUING A FINAL NEGATIVE REPORT     Performed at [facility]   Report Status PENDING   Incomplete  URINE CULTURE     Status: None   Collection Time    07/25/13  4:55 PM      Result Value Ref Range Status   Specimen Description URINE, RANDOM   Final   Special Requests NONE   Final   Culture  Setup Time     Final   Value: 07/25/2013 17:20     Performed at [place]     Final   Value: >=100,000 COLONIES/ML  Performed at [facility]     Final   Value: ESCHERICHIA COLI     Performed at [facility]   Report Status PENDING   Incomplete  MRSA PCR SCREENING     Status: None   Collection Time    07/25/13 11:42 PM      Result Value Ref Range Status   MRSA by PCR NEGATIVE  NEGATIVE Final   Comment:            The GeneXpert MRSA Assay (FDA     approved for NASAL specimens     only), is one component of a     comprehensive MRSA colonization     surveillance program. It is not     intended to diagnose MRSA     infection nor to guide or     monitor treatment for     MRSA infections.     Studies:  Recent x-ray studies have been reviewed in detail by the Attending Physician  Scheduled Meds:  Scheduled Meds: . aspirin  325 mg Oral Daily  . atorvastatin  40 mg Oral q1800  . ceFEPime (MAXIPIME) IV  2 g Intravenous Q M,W,F-2000  . cloNIDine  0.2 mg Oral BID  . feeding supplement (NEPRO CARB STEADY)  237 mL Oral BID BM  . ferrous sulfate  650 mg Oral Q breakfast  . heparin  5,000 Units Subcutaneous 3 times per day  . labetalol   200 mg Oral TID  . methylPREDNISolone (SOLU-MEDROL) injection  125 mg Intravenous Q12H  . multivitamin with minerals  1 tablet Oral Daily  . sodium chloride  3 mL Intravenous Q12H  . valACYclovir  500 mg Oral QHS  . vancomycin  750 mg Intravenous Q M,W,F-HD   Continuous Infusions:   Time spent on care of this patient: 40 min   [place]as, MD 07/27/2013, 12:09 PM  LOS: 2 days   [facility] Office  [phone] Pager - Text Page per Shea Evans   If 7PM-7AM, please contact night-coverage [email or web address]

## 2013-07-28 ENCOUNTER — Inpatient Hospital Stay (HOSPITAL_COMMUNITY): Payer: Medicare HMO

## 2013-07-28 ENCOUNTER — Encounter (HOSPITAL_COMMUNITY): Payer: Self-pay | Admitting: Radiology

## 2013-07-28 LAB — COMPREHENSIVE METABOLIC PANEL
ALK PHOS: 84 U/L (ref 39–117)
ALT: 12 U/L (ref 0–35)
ANION GAP: 17 — AB (ref 5–15)
AST: 42 U/L — ABNORMAL HIGH (ref 0–37)
Albumin: 2.2 g/dL — ABNORMAL LOW (ref 3.5–5.2)
BILIRUBIN TOTAL: 0.2 mg/dL — AB (ref 0.3–1.2)
BUN: 12 mg/dL (ref 6–23)
CHLORIDE: 94 meq/L — AB (ref 96–112)
CO2: 26 meq/L (ref 19–32)
CREATININE: 3.22 mg/dL — AB (ref 0.50–1.10)
Calcium: 8.1 mg/dL — ABNORMAL LOW (ref 8.4–10.5)
GFR calc Af Amer: 15 mL/min — ABNORMAL LOW (ref >90)
GFR, EST NON AFRICAN AMERICAN: 13 mL/min — AB (ref >90)
Glucose, Bld: 112 mg/dL — ABNORMAL HIGH (ref 70–99)
POTASSIUM: 3.7 meq/L (ref 3.7–5.3)
Sodium: 137 mEq/L (ref 137–147)
Total Protein: 6 g/dL (ref 6.0–8.3)

## 2013-07-28 LAB — CBC WITH DIFFERENTIAL/PLATELET
BASOS PCT: 0 % (ref 0–1)
Basophils Absolute: 0 10*3/uL (ref 0.0–0.1)
EOS PCT: 0 % (ref 0–5)
Eosinophils Absolute: 0 10*3/uL (ref 0.0–0.7)
HCT: 29.5 % — ABNORMAL LOW (ref 36.0–46.0)
Hemoglobin: 9.4 g/dL — ABNORMAL LOW (ref 12.0–15.0)
LYMPHS PCT: 8 % — AB (ref 12–46)
Lymphs Abs: 0.9 10*3/uL (ref 0.7–4.0)
MCH: 26.9 pg (ref 26.0–34.0)
MCHC: 31.9 g/dL (ref 30.0–36.0)
MCV: 84.3 fL (ref 78.0–100.0)
Monocytes Absolute: 1.1 10*3/uL — ABNORMAL HIGH (ref 0.1–1.0)
Monocytes Relative: 10 % (ref 3–12)
Neutro Abs: 8.8 10*3/uL — ABNORMAL HIGH (ref 1.7–7.7)
Neutrophils Relative %: 82 % — ABNORMAL HIGH (ref 43–77)
PLATELETS: 242 10*3/uL (ref 150–400)
RBC: 3.5 MIL/uL — ABNORMAL LOW (ref 3.87–5.11)
RDW: 14.7 % (ref 11.5–15.5)
WBC: 10.8 10*3/uL — AB (ref 4.0–10.5)

## 2013-07-28 LAB — URINE CULTURE

## 2013-07-28 LAB — GLUCOSE, CAPILLARY: GLUCOSE-CAPILLARY: 104 mg/dL — AB (ref 70–99)

## 2013-07-28 LAB — CULTURE, BLOOD (ROUTINE X 2)

## 2013-07-28 MED ORDER — HEPARIN SODIUM (PORCINE) 1000 UNIT/ML DIALYSIS: 2000.0000 [IU] | Freq: Once | INTRAMUSCULAR | Status: DC

## 2013-07-28 MED ORDER — LABETALOL HCL 100 MG PO TABS
100.0000 mg | ORAL_TABLET | Freq: Three times a day (TID) | ORAL | Status: DC
  Administered 2013-07-28 – 2013-07-31 (×8): 100 mg via ORAL
  Filled 2013-07-28 (×13): qty 1

## 2013-07-28 MED ORDER — NEPRO/CARBSTEADY PO LIQD: 237.0000 mL | ORAL | Status: DC | PRN

## 2013-07-28 MED ORDER — SODIUM CHLORIDE 0.9 % IV SOLN: 100.0000 mL | INTRAVENOUS | Status: DC | PRN

## 2013-07-28 MED ORDER — HEPARIN SODIUM (PORCINE) 1000 UNIT/ML DIALYSIS: 1000.0000 [IU] | INTRAMUSCULAR | Status: DC | PRN

## 2013-07-28 MED ORDER — ALTEPLASE 2 MG IJ SOLR: 2.0000 mg | Freq: Once | INTRAMUSCULAR | Status: DC | PRN

## 2013-07-28 MED ORDER — PENTAFLUOROPROP-TETRAFLUOROETH EX AERO: 1.0000 "application " | INHALATION_SPRAY | CUTANEOUS | Status: DC | PRN

## 2013-07-28 MED ORDER — CLONIDINE HCL 0.1 MG PO TABS
0.1000 mg | ORAL_TABLET | Freq: Two times a day (BID) | ORAL | Status: DC
  Administered 2013-07-29 (×2): 0.1 mg via ORAL
  Filled 2013-07-28 (×3): qty 1

## 2013-07-28 MED ORDER — CEPHALEXIN 500 MG PO CAPS
500.0000 mg | ORAL_CAPSULE | Freq: Two times a day (BID) | ORAL | Status: DC
  Administered 2013-07-28 – 2013-07-31 (×7): 500 mg via ORAL
  Filled 2013-07-28 (×10): qty 1

## 2013-07-28 MED ORDER — LIDOCAINE HCL (PF) 1 % IJ SOLN: 5.0000 mL | INTRAMUSCULAR | Status: DC | PRN

## 2013-07-28 MED ORDER — LIDOCAINE-PRILOCAINE 2.5-2.5 % EX CREA: 1.0000 "application " | TOPICAL_CREAM | CUTANEOUS | Status: DC | PRN

## 2013-07-28 MED ORDER — ALTEPLASE 2 MG IJ SOLR
2.0000 mg | Freq: Once | INTRAMUSCULAR | Status: AC | PRN
  Filled 2013-07-28: qty 2

## 2013-07-28 NOTE — Progress Notes (Addendum)
[facility] Progress Note   Jaclyn Morgan NKN:[id number] DOB: 09/10/1932 DOA: 07/25/2013 PCP: Scarlette Calico, MD  Brief narrative: Jaclyn Morgan is a 80 y.o. female with PMH of ESRD new start on HD about 3 wks ago, HTN, dementia, hypothyroid presenting with generalized weakness. She had missed the past 2 dialysis sessions and was quite confused on admission. She was also found to have shingles on her left anterior chest wall.  It is noted that she was treated for HCAP with Vanc and Cefepime- no signs of pneumonia, including no cough, fever, leukocytosis or dyspnea. She did have significantly postive UA on admission   Subjective: Pleasantly confused. I spoke with her daughter today who states that the patient has been confused ever since she started dialysis. She notes that she is more confused right after she gets dialysis. She is unable to tell if her mother is getting any better yet.  Assessment/Plan: Principal Problem:   Metabolic encephalopathy - After speaking with the daughter it appears that patient has worsening of delirium due to dialysis and most likely had dementia prior to starting dialysis. Of note the daughter seems to have been told this by other physicians as well. - Vitamin B 1474 on 6/20 -Will check an RPR and a CT of the head -We'll continue to treat her UTI    Active Problems:   Urinary tract infection, site not specified - growing E coli-sensitivities have been posted- switch Rocephin to Keflex-date 7/90  HCAP?? - This was suspected on admission despite the fact that there have been no signs or symptoms of this- d/c'd Vanc and Cefepime on 7/15 as mentioned above-   Varicella Zoster rash - switch Acyclovir to Valacyclovir- total 7 days- stop date 7/19 - has been on 125 mg of Solumedrol BID- will d/c as literature does not support its use in this situation    Essential hypertension, benign - cont clonidine- Cardura d/c'd by renal team- resume Labetalol  today as SBP >180    Unspecified hypothyroidism - not on any medications- repeat TSH and free T4    ESRD (end stage renal disease) on dialysis - per nephrology team    Weakness - no PT ordered- has not been out of bed since admission- will order   Code Status: Full code Family Communication: none Disposition Plan: daughter unable to take care of her at home- requesting SNF- PT agrees that she needs SNF placement  Consultants: Nephrology  Procedures: none  Antibiotics: Anti-infectives   Start     Dose/Rate Route Frequency Ordered Stop   07/27/13 2000  ceFEPIme (MAXIPIME) 1 g in dextrose 5 % 50 mL IVPB  Status:  Discontinued     1 g 100 mL/hr over 30 Minutes Intravenous Every M-W-F (2000) 07/26/13 1107 07/26/13 1111   07/27/13 2000  ceFEPIme (MAXIPIME) 2 g in dextrose 5 % 50 mL IVPB  Status:  Discontinued     2 g 100 mL/hr over 30 Minutes Intravenous Every M-W-F (2000) 07/26/13 1111 07/27/13 1214   07/27/13 1300  cefTRIAXone (ROCEPHIN) 1 g in dextrose 5 % 50 mL IVPB     1 g 100 mL/hr over 30 Minutes Intravenous Every 24 hours 07/27/13 1214     07/27/13 1200  vancomycin (VANCOCIN) IVPB 750 mg/150 ml premix  Status:  Discontinued     750 mg 150 mL/hr over 60 Minutes Intravenous Every M-W-F (Hemodialysis) 07/26/13 1107 07/27/13 1214   07/26/13 2200  valACYclovir (VALTREX) tablet 500 mg  500 mg Oral Daily at bedtime 07/26/13 1148     07/26/13 2000  ceFEPIme (MAXIPIME) 1 g in dextrose 5 % 50 mL IVPB  Status:  Discontinued     1 g 100 mL/hr over 30 Minutes Intravenous Every 24 hours 07/25/13 1914 07/25/13 2017   07/26/13 2000  ceFEPIme (MAXIPIME) 1 g in dextrose 5 % 50 mL IVPB     1 g 100 mL/hr over 30 Minutes Intravenous  Once 07/26/13 1107 07/26/13 2106   07/26/13 1700  acyclovir (ZOVIRAX) 365 mg in dextrose 5 % 100 mL IVPB  Status:  Discontinued     5 mg/kg  73 kg 107.3 mL/hr over 60 Minutes Intravenous Every 24 hours 07/25/13 2146 07/26/13 1148   07/26/13 1200   vancomycin (VANCOCIN) IVPB 750 mg/150 ml premix     750 mg 150 mL/hr over 60 Minutes Intravenous Every Tue (Hemodialysis) 07/26/13 1107 07/26/13 1615   07/25/13 2200  ceFEPIme (MAXIPIME) 1 g in dextrose 5 % 50 mL IVPB  Status:  Discontinued     1 g 100 mL/hr over 30 Minutes Intravenous 3 times per day 07/25/13 1952 07/25/13 1954   07/25/13 2200  acyclovir (ZOVIRAX) 5 mg/kg in dextrose 5 % 250 mL IVPB  Status:  Discontinued     5 mg/kg 250 mL/hr over 60 Minutes Intravenous 3 times per day 07/25/13 2009 07/25/13 2013   07/25/13 2200  vancomycin (VANCOCIN) 500 mg in sodium chloride 0.9 % 100 mL IVPB     500 mg 100 mL/hr over 60 Minutes Intravenous  Once 07/25/13 2145 07/26/13 0048   07/25/13 1915  ceFEPIme (MAXIPIME) 2 g in dextrose 5 % 50 mL IVPB     2 g 100 mL/hr over 30 Minutes Intravenous  Once 07/25/13 1856 07/26/13 0100   07/25/13 1900  vancomycin (VANCOCIN) IVPB 1000 mg/200 mL premix     1,000 mg 200 mL/hr over 60 Minutes Intravenous  Once 07/25/13 1856 07/25/13 2006   07/25/13 1830  cefTRIAXone (ROCEPHIN) 1 g in dextrose 5 % 50 mL IVPB  Status:  Discontinued     1 g 100 mL/hr over 30 Minutes Intravenous  Once 07/25/13 1823 07/26/13 0019   07/25/13 1600  acyclovir (ZOVIRAX) 700 mg in dextrose 5 % 100 mL IVPB     700 mg 114 mL/hr over 60 Minutes Intravenous  Once 07/25/13 1548 07/25/13 1817       DVT prophylaxis: heparin  Objective: Filed Weights   07/26/13 2100 07/27/13 2140 07/28/13 0120  Weight: 68.9 kg (151 lb 14.4 oz) 69.6 kg (153 lb 7 oz) 67.2 kg (148 lb 2.4 oz)    Vitals Filed Vitals:   07/28/13 0100 07/28/13 0120 07/28/13 0615 07/28/13 1005  BP: 114/53 155/85 166/59 186/45  Pulse: 56 114 65 73  Temp:   99.2 F (37.3 C) 99.7 F (37.6 C)  TempSrc:    Oral  Resp:  18 18 19   Height:      Weight:  67.2 kg (148 lb 2.4 oz)    SpO2:   92% 97%      Intake/Output Summary (Last 24 hours) at 07/28/13 1534 Last data filed at 07/28/13 0120  Gross per 24 hour   Intake    118 ml  Output   2800 ml  Net  -2682 ml     Exam: General: confused, no following all commands, No acute respiratory distress Lungs: will not take deep breaths when asked, decreased breath sounds Cardiovascular: Regular rate and rhythm without murmur  gallop or rub normal S1 and S2 Abdomen: Nontender, nondistended, soft, bowel sounds positive, no rebound, no ascites, no appreciable mass Extremities: No significant cyanosis, clubbing- resolving edema bilateral lower extremities Skin: blisters on anterior chest wall starting at sternum and going to left chest wall- no open blisters yet- ares is not tender.   Data Reviewed: Basic Metabolic Panel:  Recent Labs Lab 07/25/13 1420 07/25/13 1610 07/25/13 2019 07/26/13 0532 07/27/13 0350 07/28/13 0530  NA 138  --   --  139 134* 137  K 3.8  --   --  4.0 4.3 3.7  CL 97  --   --  98 92* 94*  CO2 23  --   --  23 25 26   GLUCOSE 119*  --   --  110* 209* 112*  BUN 40*  --   --  45* 23 12  CREATININE 8.86*  --  8.91* 9.23* 5.07* 3.22*  CALCIUM 8.6  --   --  8.1* 8.1* 8.1*  MG  --  2.1  --   --   --   --   PHOS  --  4.3  --   --   --   --    Liver Function Tests:  Recent Labs Lab 07/25/13 1610 07/26/13 0532 07/27/13 0350 07/28/13 0530  AST 39* 41* 42* 42*  ALT 11 11 12 12   ALKPHOS 86 75 89 84  BILITOT 0.3 0.3 0.2* 0.2*  PROT 5.7* 5.2* 6.0 6.0  ALBUMIN 2.1* 1.9* 2.1* 2.2*   No results found for this basename: LIPASE, AMYLASE,  in the last 168 hours No results found for this basename: AMMONIA,  in the last 168 hours CBC:  Recent Labs Lab 07/25/13 1420 07/25/13 2019 07/26/13 0532 07/27/13 0350 07/28/13 0530  WBC 7.6 6.0 7.5 4.5 10.8*  NEUTROABS 5.7  --  5.4 3.6 8.8*  HGB 8.5* 8.4* 8.2* 9.0* 9.4*  HCT 26.9* 26.6* 25.8* 27.8* 29.5*  MCV 83.8 85.0 84.3 82.2 84.3  PLT 223 202 227 236 242   Cardiac Enzymes:  Recent Labs Lab 07/25/13 1610 07/25/13 2300 07/26/13 0532 07/26/13 0755  TROPONINI <0.30 <0.30  <0.30 <0.30   BNP (last 3 results)  Recent Labs  07/01/13 2055 07/25/13 1610  PROBNP >70000.0* >70000.0*   CBG:  Recent Labs Lab 07/26/13 0754 07/27/13 0801 07/28/13 0754  GLUCAP 92 176* 104*    Recent Results (from the past 240 hour(s))  CULTURE, BLOOD (ROUTINE X 2)     Status: None   Collection Time    07/25/13  4:10 PM      Result Value Ref Range Status   Specimen Description BLOOD RIGHT ARM   Final   Special Requests BOTTLES DRAWN AEROBIC AND ANAEROBIC 5CC   Final   Culture  Setup Time     Final   Value: 07/25/2013 19:22     Performed at [facility]   Culture     Final   Value: STAPHYLOCOCCUS SPECIES (COAGULASE NEGATIVE)     Note: THE SIGNIFICANCE OF ISOLATING THIS ORGANISM FROM A SINGLE SET OF BLOOD CULTURES WHEN MULTIPLE SETS ARE DRAWN IS UNCERTAIN. PLEASE NOTIFY THE MICROBIOLOGY DEPARTMENT WITHIN ONE WEEK IF SPECIATION AND SENSITIVITIES ARE REQUIRED.     Note: Gram Stain Report Called to,Read Back By and Verified With: [facility] RN [phone]     Performed at [facility]   Report Status 07/28/2013 FINAL   Final  CULTURE, BLOOD (ROUTINE X 2)     Status:  None   Collection Time    07/25/13  4:15 PM      Result Value Ref Range Status   Specimen Description BLOOD RIGHT HAND   Final   Special Requests BOTTLES DRAWN AEROBIC ONLY 5CC   Final   Culture  Setup Time     Final   Value: 07/25/2013 19:23     Performed at [facility]   Culture     Final   Value:        BLOOD CULTURE RECEIVED NO GROWTH TO DATE CULTURE WILL BE HELD FOR 5 DAYS BEFORE ISSUING A FINAL NEGATIVE REPORT     Performed at [facility]   Report Status PENDING   Incomplete  URINE CULTURE     Status: None   Collection Time    07/25/13  4:55 PM      Result Value Ref Range Status   Specimen Description URINE, RANDOM   Final   Special Requests NONE   Final   Culture  Setup Time     Final   Value: 07/25/2013 17:20     Performed at [place]      Final   Value: >=100,000 COLONIES/ML     Performed at [facility]   Culture     Final   Value: ESCHERICHIA COLI     Performed at [facility]   Report Status 07/28/2013 FINAL   Final   Organism ID, Bacteria ESCHERICHIA COLI   Final  MRSA PCR SCREENING     Status: None   Collection Time    07/25/13 11:42 PM      Result Value Ref Range Status   MRSA by PCR NEGATIVE  NEGATIVE Final   Comment:            The GeneXpert MRSA Assay (FDA     approved for NASAL specimens     only), is one component of a     comprehensive MRSA colonization     surveillance program. It is not     intended to diagnose MRSA     infection nor to guide or     monitor treatment for     MRSA infections.     Studies:  Recent x-ray studies have been reviewed in detail by the Attending Physician  Scheduled Meds:  Scheduled Meds: . aspirin  325 mg Oral Daily  . atorvastatin  40 mg Oral q1800  . cefTRIAXone (ROCEPHIN)  IV  1 g Intravenous Q24H  . cloNIDine  0.1 mg Oral BID  . [START ON 08/03/2013] darbepoetin (ARANESP) injection - DIALYSIS  40 mcg Intravenous Q Wed-HD  . [START ON 07/29/2013] doxercalciferol  4 mcg Intravenous Q M,W,F-HD  . feeding supplement (NEPRO CARB STEADY)  237 mL Oral BID BM  . ferrous sulfate  650 mg Oral Q breakfast  . [START ON 07/29/2013] heparin  2,000 Units Dialysis Once in dialysis  . heparin  5,000 Units Subcutaneous 3 times per day  . labetalol  100 mg Oral TID  . multivitamin  1 tablet Oral QHS  . sodium chloride  3 mL Intravenous Q12H  . valACYclovir  500 mg Oral QHS   Continuous Infusions:   Time spent on care of this patient: 30 min   [place], MD 07/28/2013, 3:34 PM  LOS: 3 days   [facility] Office  [phone] Pager - Text Page per Shea Evans   If 7PM-7AM, please contact night-coverage [email or web address]

## 2013-07-28 NOTE — Evaluation (Signed)
Physical Therapy Evaluation Patient Details Name: Jaclyn Morgan MRN: [id number] DOB: 14-Apr-1932 Today's Date: 07/28/2013   History of Present Illness  80 y.o. female with PMH of ESRD new start on HD about 3 wks ago, HTN, dementia, hypothyroid presenting with generalized weakness. She had missed the past 2 dialysis sessions and was quite confused on admission. She was also found to have shingles on her left anterior chest wall.    Clinical Impression  Pt adm due to above. No family present to collaborate home setup and PLOF. Pt presents with significant deficits with functional mobility secondary to deficits indicated below. Pt to benefit from skilled acute PT to address deficits and maximize functional mobility. Will recommend SNF for post acute rehab.    Follow Up Recommendations SNF;Supervision/Assistance - 24 hour    Equipment Recommendations  Other (comment) (TBD at SNF)    Recommendations for Other Services       Precautions / Restrictions Precautions Precautions: Fall Precaution Comments: Airborne Precautions  Restrictions Weight Bearing Restrictions: No      Mobility  Bed Mobility Overal bed mobility: Needs Assistance Bed Mobility: Supine to Sit     Supine to sit: Mod assist     General bed mobility comments: mod (A) to bring hips up to EOB; cues for hand placement and seqeuencing; pt following commands <50% of time  Transfers Overall transfer level: Needs assistance Equipment used: 1 person hand held assist Transfers: Sit to/from [facility] Sit to Stand: Max assist Stand pivot transfers: Total assist       General transfer comment: pt freezing during transfers and required total (A) to perform SPT; max cues for hand placement and safety; pt attempting to sit prior to reaching chair; demo decr safety awareness    Ambulation/Gait                Stairs            Wheelchair Mobility    Modified Rankin (Stroke Patients Only)       Balance Overall balance assessment: Needs assistance Sitting-balance support: Feet supported;Single extremity supported Sitting balance-Leahy Scale: Poor Sitting balance - Comments: leaning posteriorly throughout; requires UE support and cues for upright posture  Postural control: Posterior lean Standing balance support: During functional activity;Bilateral upper extremity supported Standing balance-Leahy Scale: Zero Standing balance comment: requires total (A) to maintain balance                              Pertinent Vitals/Pain No c/o pain     Home Living Family/patient expects to be discharged to:: Skilled nursing facility                 Additional Comments: no family present     Prior Function           Comments: Pt is a poor historian; no family present; per recent PT evaluation pt was ambulating with min (A) and RW      Hand Dominance        Extremity/Trunk Assessment   Upper Extremity Assessment: Defer to OT evaluation           Lower Extremity Assessment: Generalized weakness      Cervical / Trunk Assessment: Kyphotic  Communication   Communication: No difficulties  Cognition Arousal/Alertness: Awake/alert Behavior During Therapy: WFL for tasks assessed/performed Overall Cognitive Status: Impaired/Different from baseline Area of Impairment: Orientation;Memory;Following commands;Safety/judgement;Problem solving Orientation Level: Disoriented to;Situation;Time;Place Current Attention Level:  Selective Memory: Decreased short-term memory Following Commands: Follows one step commands inconsistently;Follows one step commands with increased time Safety/Judgement: Decreased awareness of safety;Decreased awareness of deficits   Problem Solving: Slow processing;Decreased initiation;Difficulty sequencing;Requires verbal cues;Requires tactile cues General Comments: pt confused throughout session; would respond to commands then not  follow them; no family present; pt easily distracted througout session;     General Comments      Exercises        Assessment/Plan    PT Assessment Patient needs continued PT services  PT Diagnosis Difficulty walking;Generalized weakness;Altered mental status   PT Problem List Decreased strength;Decreased activity tolerance;Decreased balance;Decreased mobility;Decreased cognition;Decreased knowledge of use of DME;Decreased safety awareness;Cardiopulmonary status limiting activity  PT Treatment Interventions DME instruction;Gait training;Functional mobility training;Therapeutic exercise;Cognitive remediation;Patient/family education;Neuromuscular re-education;Balance training;Therapeutic activities   PT Goals (Current goals can be found in the Care Plan section) Acute Rehab PT Goals Patient Stated Goal: none stated PT Goal Formulation: With patient Time For Goal Achievement: 08/04/13 Potential to Achieve Goals: Fair    Frequency Min 2X/week   Barriers to discharge   no family in room     Co-evaluation               End of Session Equipment Utilized During Treatment: Gait belt Activity Tolerance: Patient tolerated treatment well Patient left: in chair;with call bell/phone within reach;with chair alarm set Nurse Communication: Mobility status;Precautions         Time: 1448-1856 PT Time Calculation (min): 19 min   Charges:   PT Evaluation $Initial PT Evaluation Tier I: 1 Procedure PT Treatments $Therapeutic Activity: 8-22 mins   PT G CodesGustavus Bryant, [place] 07/28/2013, 1:03 PM

## 2013-07-28 NOTE — Progress Notes (Signed)
Subjective:   Still confused, 'at home.' Feeling good, no complaints  Objective Filed Vitals:   07/28/13 0030 07/28/13 0100 07/28/13 0120 07/28/13 0615  BP: 149/96 114/53 155/85 166/59  Pulse: 109 56 114 65  Temp:    99.2 F (37.3 C)  TempSrc:      Resp:   18 18  Height:      Weight:   67.2 kg (148 lb 2.4 oz)   SpO2:    92%   Physical Exam General: alert, confused. No acute distress Heart: RRR, no murmur  Lungs: CTA, unlabored Abdomen: soft, nontender +BS Extremities: no edema  Dialysis Access: R IJ cath. maturing L AVF +bruit/thrill  HD: [facility]  4h 71.5kg 450/800 2/2.5 [place] R IJ Cath (maturing LFA AVF) Heparin 2000  Aranesp 40 ug q Wed, Hect 4 ug TIW  Assessment/Plan:  1. AMS- unclear cause, has missed HD. Not much better. Still +asterixis and confused. She is not getting any narcotics, neurontin or other sedating or neurotoxic meds. Plan HD tomorrow. 2. ESRD -MWF @ [place],. K+3.7  3. Anemia - hgb 9.4. Cont esa  4. Secondary hyperparathyroidism - ca+ 8.1/9.6. Cont hectorol  5. HTN/volume - 166/59 under edw, lower at DC. Clonidine and labetalol  6. Nutrition - alb 2.2. Renal diet. Nepro, multi vit  7. Herpes zoster- Valacyclovir stop date 7/19  8. UTI- ecoli. On rocephin 9. Dispo- family requesting SNF  Shelle Iron, NP [place] [phone] 07/28/2013,9:10 AM  LOS: 3 days   Pt seen, examined, agree w assess/plan as above with additions as indicated.  Kelly Splinter MD pager [phone]    cell [phone] 07/28/2013, 1:32 PM     Additional Objective Labs: Basic Metabolic Panel:  Recent Labs Lab 07/25/13 1610  07/26/13 0532 07/27/13 0350 07/28/13 0530  NA  --   --  139 134* 137  K  --   --  4.0 4.3 3.7  CL  --   --  98 92* 94*  CO2  --   --  23 25 26   GLUCOSE  --   --  110* 209* 112*  BUN  --   --  45* 23 12  CREATININE  --   < > 9.23* 5.07* 3.22*  CALCIUM  --   --  8.1* 8.1* 8.1*  PHOS 4.3  --   --   --   --   < > = values in  this interval not displayed. Liver Function Tests:  Recent Labs Lab 07/26/13 0532 07/27/13 0350 07/28/13 0530  AST 41* 42* 42*  ALT 11 12 12   ALKPHOS 75 89 84  BILITOT 0.3 0.2* 0.2*  PROT 5.2* 6.0 6.0  ALBUMIN 1.9* 2.1* 2.2*   No results found for this basename: LIPASE, AMYLASE,  in the last 168 hours CBC:  Recent Labs Lab 07/25/13 1420 07/25/13 2019 07/26/13 0532 07/27/13 0350 07/28/13 0530  WBC 7.6 6.0 7.5 4.5 10.8*  NEUTROABS 5.7  --  5.4 3.6 8.8*  HGB 8.5* 8.4* 8.2* 9.0* 9.4*  HCT 26.9* 26.6* 25.8* 27.8* 29.5*  MCV 83.8 85.0 84.3 82.2 84.3  PLT 223 202 227 236 242   Blood Culture    Component Value Date/Time   SDES URINE, RANDOM 07/25/2013 1655   SPECREQUEST NONE 07/25/2013 1655   CULT  Value: ESCHERICHIA COLI Performed at [facility] 07/25/2013 1655   REPTSTATUS PENDING 07/25/2013 1655    Cardiac Enzymes:  Recent Labs Lab 07/25/13 1610 07/25/13 2300 07/26/13 0532 07/26/13  [place] <0.30 <0.30 <0.30 <0.30   CBG:  Recent Labs Lab 07/26/13 0754 07/27/13 0801 07/28/13 0754  GLUCAP 92 176* 104*   Iron Studies: No results found for this basename: IRON, TIBC, TRANSFERRIN, FERRITIN,  in the last 72 hours @lablastinr3 @ Studies/Results: No results found. Medications:   . aspirin  325 mg Oral Daily  . atorvastatin  40 mg Oral q1800  . cefTRIAXone (ROCEPHIN)  IV  1 g Intravenous Q24H  . cloNIDine  0.2 mg Oral BID  . [START ON 08/03/2013] darbepoetin (ARANESP) injection - DIALYSIS  40 mcg Intravenous Q Wed-HD  . [START ON 07/29/2013] doxercalciferol  4 mcg Intravenous Q M,W,F-HD  . feeding supplement (NEPRO CARB STEADY)  237 mL Oral BID BM  . ferrous sulfate  650 mg Oral Q breakfast  . [START ON 07/29/2013] heparin  2,000 Units Dialysis Once in dialysis  . heparin  5,000 Units Subcutaneous 3 times per day  . labetalol  200 mg Oral TID  . multivitamin  1 tablet Oral QHS  . sodium chloride  3 mL Intravenous Q12H  . valACYclovir  500 mg  Oral QHS

## 2013-07-29 DIAGNOSIS — I10 Essential (primary) hypertension: Secondary | ICD-10-CM

## 2013-07-29 LAB — TSH: TSH: 6.74 u[IU]/mL — AB (ref 0.350–4.500)

## 2013-07-29 LAB — HIV ANTIBODY (ROUTINE TESTING W REFLEX): HIV 1&2 Ab, 4th Generation: NONREACTIVE

## 2013-07-29 LAB — GLUCOSE, CAPILLARY: Glucose-Capillary: 85 mg/dL (ref 70–99)

## 2013-07-29 LAB — RPR

## 2013-07-29 MED ORDER — SODIUM CHLORIDE 0.9 % IV SOLN
100.0000 mL | INTRAVENOUS | Status: DC | PRN
Start: 2013-07-29 — End: 2013-08-01

## 2013-07-29 MED ORDER — LIDOCAINE HCL (PF) 1 % IJ SOLN: 5.0000 mL | INTRAMUSCULAR | Status: DC | PRN

## 2013-07-29 MED ORDER — PENTAFLUOROPROP-TETRAFLUOROETH EX AERO: 1.0000 "application " | INHALATION_SPRAY | CUTANEOUS | Status: DC | PRN

## 2013-07-29 MED ORDER — SODIUM CHLORIDE 0.9 % IV SOLN: 100.0000 mL | INTRAVENOUS | Status: DC | PRN

## 2013-07-29 MED ORDER — DOXAZOSIN MESYLATE 4 MG PO TABS
4.0000 mg | ORAL_TABLET | Freq: Two times a day (BID) | ORAL | Status: DC
  Administered 2013-07-29 – 2013-07-31 (×5): 4 mg via ORAL
  Filled 2013-07-29 (×7): qty 1

## 2013-07-29 MED ORDER — DOXERCALCIFEROL 4 MCG/2ML IV SOLN
INTRAVENOUS | Status: AC
  Filled 2013-07-29: qty 2

## 2013-07-29 MED ORDER — ALTEPLASE 2 MG IJ SOLR
2.0000 mg | Freq: Once | INTRAMUSCULAR | Status: AC | PRN
  Filled 2013-07-29: qty 2

## 2013-07-29 MED ORDER — NEPRO/CARBSTEADY PO LIQD: 237.0000 mL | ORAL | Status: DC | PRN

## 2013-07-29 MED ORDER — HEPARIN SODIUM (PORCINE) 1000 UNIT/ML DIALYSIS
1000.0000 [IU] | INTRAMUSCULAR | Status: DC | PRN
  Filled 2013-07-29: qty 1

## 2013-07-29 MED ORDER — LIDOCAINE-PRILOCAINE 2.5-2.5 % EX CREA: 1.0000 "application " | TOPICAL_CREAM | CUTANEOUS | Status: DC | PRN

## 2013-07-29 MED ORDER — CLONIDINE HCL 0.2 MG PO TABS
0.2000 mg | ORAL_TABLET | Freq: Two times a day (BID) | ORAL | Status: DC
  Administered 2013-07-29 – 2013-07-31 (×4): 0.2 mg via ORAL
  Filled 2013-07-29 (×5): qty 1

## 2013-07-29 MED ORDER — HEPARIN SODIUM (PORCINE) 1000 UNIT/ML DIALYSIS
2000.0000 [IU] | Freq: Once | INTRAMUSCULAR | Status: DC
  Filled 2013-07-29: qty 2

## 2013-07-29 NOTE — Clinical Social Work Psychosocial (Signed)
Clinical Social Work Department BRIEF PSYCHOSOCIAL ASSESSMENT 07/29/2013  Patient:  Jaclyn Morgan, Jaclyn Morgan     Account Number:  [id number]     Admit date:  07/25/2013  Clinical Social Worker:  Frederico Hamman  Date/Time:  07/29/2013 11:52 AM  Referred by:  Physician  Date Referred:  07/27/2013 Referred for  SNF Placement   Other Referral:   Interview type:  Family Other interview type:    PSYCHOSOCIAL DATA Living Status:  FAMILY Admitted from facility:   Level of care:   Primary support name:  Jaclyn Morgan Primary support relationship to patient:  CHILD, ADULT Degree of support available:   Strong support. Patient and daughter lives together and daughter very involved in her care. Daughter's phone number: [phone].    CURRENT CONCERNS Current Concerns  Post-Acute Placement   Other Concerns:    SOCIAL WORK ASSESSMENT / PLAN CSW talked with daughter by phone regarding discharge planning and recommendation of ST rehab for patient at discharge. Daughter initially hesitant as patient was to go to a facility at last admission but daughter took her home as facility beds did not have bed rails. CSW and daughter revisited her concern and daughter made aware that SNF beds don't have rails. Daughter in agreement with rehab as patient is hard to care for and get to dialysis per daughter. CSW explained process and answered questions daughter had about facilities that take patient's insurance. Daughter advised that SNF list will be made available to her when she visits her mother.   Assessment/plan status:  Psychosocial Support/Ongoing Assessment of Needs Other assessment/ plan:   Information/referral to community resources:   SNF list for [facility]    PATIENT'S/FAMILY'S RESPONSE TO PLAN OF CARE: Daughter receptive to talking with CSW about discharge planning but initially hesitant of placing patient in a facility. In response to daughter's questions, she was informed that SNF stay  would be short-term for rehab.

## 2013-07-29 NOTE — Progress Notes (Signed)
07/29/13 06:25 Patient's BP 219/73 HR 61,rechecked with manual cuff 195/90.Patient denies any c/o.Patient for HD this am.Text paged K. Schorr,awaiting call back. Safia Panzer, Wonda Cheng, Therapist, sports

## 2013-07-29 NOTE — Progress Notes (Signed)
[facility] Progress Note   Jaclyn Morgan:[id number] DOB: [birth year] DOA: 07/25/2013 PCP: Scarlette Calico, MD  Brief narrative: Jaclyn Morgan is a 80 y.o. female with PMH of ESRD new start on HD about 3 wks ago, HTN, dementia, hypothyroid presenting with generalized weakness. She had missed the past 2 dialysis sessions and was quite confused on admission. She was also found to have shingles on her left anterior chest wall.   I spoke with her daughter who states that the patient has been confused ever since she started dialysis. She notes that she is more confused right after she gets dialysis. She is unable to tell if her mother is getting any better yet.   Subjective: Pleasantly confused.  Assessment/Plan: Principal Problem:   Metabolic encephalopathy - After speaking with the daughter it appears that patient has worsening of delirium due to dialysis and most likely had dementia prior to starting dialysis. Of note the daughter seems to have been told this by other physicians as well. - Vitamin B 1474 on 6/20 -Will check an RPR non-reactive - CT of the head negative for acute CVA -We'll continue to treat her UTI    Active Problems:   Urinary tract infection, site not specified - growing E coli-sensitivities have been posted- switch Rocephin to Keflex-end date 7/19  HCAP?? - This was suspected on admission despite the fact that there have been no signs or symptoms of this- d/c'd Vanc and Cefepime on 7/15 as mentioned above- still no signs of pneumonia  Varicella Zoster rash-  - switched Acyclovir to Valacyclovir- total 7 days- stop date 7/19 -was on 125 mg of Solumedrol BID-  D/c'd this as literature does not support its use in this situation    Essential hypertension, benign - BP quite high today- have read renal note- will wait until after dialysis to see if BP has improved, if not will increase antihypertensives.     Unspecified hypothyroidism - not on any  medications- repeat TSH and free T4    ESRD (end stage renal disease) on dialysis - per nephrology team    Weakness - no PT ordered- has not been out of bed since admission- will order   Code Status: Full code Family Communication: none Disposition Plan: daughter unable to take care of her at home- requesting SNF- PT agrees that she needs SNF placement  Consultants: Nephrology  Procedures: none  Antibiotics: Anti-infectives   Start     Dose/Rate Route Frequency Ordered Stop   07/28/13 1630  cephALEXin (KEFLEX) capsule 500 mg     500 mg Oral Every 12 hours 07/28/13 1544     07/27/13 2000  ceFEPIme (MAXIPIME) 1 g in dextrose 5 % 50 mL IVPB  Status:  Discontinued     1 g 100 mL/hr over 30 Minutes Intravenous Every M-W-F (2000) 07/26/13 1107 07/26/13 1111   07/27/13 2000  ceFEPIme (MAXIPIME) 2 g in dextrose 5 % 50 mL IVPB  Status:  Discontinued     2 g 100 mL/hr over 30 Minutes Intravenous Every M-W-F (2000) 07/26/13 1111 07/27/13 1214   07/27/13 1300  cefTRIAXone (ROCEPHIN) 1 g in dextrose 5 % 50 mL IVPB  Status:  Discontinued     1 g 100 mL/hr over 30 Minutes Intravenous Every 24 hours 07/27/13 1214 07/28/13 1544   07/27/13 1200  vancomycin (VANCOCIN) IVPB 750 mg/150 ml premix  Status:  Discontinued     750 mg 150 mL/hr over 60 Minutes Intravenous Every M-W-F (Hemodialysis) 07/26/13  1107 07/27/13 1214   07/26/13 2200  valACYclovir (VALTREX) tablet 500 mg     500 mg Oral Daily at bedtime 07/26/13 1148     07/26/13 2000  ceFEPIme (MAXIPIME) 1 g in dextrose 5 % 50 mL IVPB  Status:  Discontinued     1 g 100 mL/hr over 30 Minutes Intravenous Every 24 hours 07/25/13 1914 07/25/13 2017   07/26/13 2000  ceFEPIme (MAXIPIME) 1 g in dextrose 5 % 50 mL IVPB     1 g 100 mL/hr over 30 Minutes Intravenous  Once 07/26/13 1107 07/26/13 2106   07/26/13 1700  acyclovir (ZOVIRAX) 365 mg in dextrose 5 % 100 mL IVPB  Status:  Discontinued     5 mg/kg  73 kg 107.3 mL/hr over 60 Minutes  Intravenous Every 24 hours 07/25/13 2146 07/26/13 1148   07/26/13 1200  vancomycin (VANCOCIN) IVPB 750 mg/150 ml premix     750 mg 150 mL/hr over 60 Minutes Intravenous Every Tue (Hemodialysis) 07/26/13 1107 07/26/13 1615   07/25/13 2200  ceFEPIme (MAXIPIME) 1 g in dextrose 5 % 50 mL IVPB  Status:  Discontinued     1 g 100 mL/hr over 30 Minutes Intravenous 3 times per day 07/25/13 1952 07/25/13 1954   07/25/13 2200  acyclovir (ZOVIRAX) 5 mg/kg in dextrose 5 % 250 mL IVPB  Status:  Discontinued     5 mg/kg 250 mL/hr over 60 Minutes Intravenous 3 times per day 07/25/13 2009 07/25/13 2013   07/25/13 2200  vancomycin (VANCOCIN) 500 mg in sodium chloride 0.9 % 100 mL IVPB     500 mg 100 mL/hr over 60 Minutes Intravenous  Once 07/25/13 2145 07/26/13 0048   07/25/13 1915  ceFEPIme (MAXIPIME) 2 g in dextrose 5 % 50 mL IVPB     2 g 100 mL/hr over 30 Minutes Intravenous  Once 07/25/13 1856 07/26/13 0100   07/25/13 1900  vancomycin (VANCOCIN) IVPB 1000 mg/200 mL premix     1,000 mg 200 mL/hr over 60 Minutes Intravenous  Once 07/25/13 1856 07/25/13 2006   07/25/13 1830  cefTRIAXone (ROCEPHIN) 1 g in dextrose 5 % 50 mL IVPB  Status:  Discontinued     1 g 100 mL/hr over 30 Minutes Intravenous  Once 07/25/13 1823 07/26/13 0019   07/25/13 1600  acyclovir (ZOVIRAX) 700 mg in dextrose 5 % 100 mL IVPB     700 mg 114 mL/hr over 60 Minutes Intravenous  Once 07/25/13 1548 07/25/13 1817       DVT prophylaxis: heparin  Objective: Filed Weights   07/27/13 2140 07/28/13 0120 07/28/13 2043  Weight: 69.6 kg (153 lb 7 oz) 67.2 kg (148 lb 2.4 oz) 67 kg (147 lb 11.3 oz)    Vitals Filed Vitals:   07/28/13 2043 07/29/13 0540 07/29/13 0603 07/29/13 0945  BP: 160/67 219/73 195/90 180/69  Pulse: 58 61  70  Temp: 99.6 F (37.6 C) 98.3 F (36.8 C)  98.6 F (37 C)  TempSrc: Oral Oral  Oral  Resp: 19 17  17   Height:      Weight: 67 kg (147 lb 11.3 oz)     SpO2: 99% 100%  100%      Intake/Output  Summary (Last 24 hours) at 07/29/13 1552 Last data filed at 07/29/13 0900  Gross per 24 hour  Intake    358 ml  Output      0 ml  Net    358 ml     Exam: General: confused, no  following all commands, No acute respiratory distress Lungs: will not take deep breaths when asked, decreased breath sounds Cardiovascular: Regular rate and rhythm without murmur gallop or rub normal S1 and S2 Abdomen: Nontender, nondistended, soft, bowel sounds positive, no rebound, no ascites, no appreciable mass Extremities: No significant cyanosis, clubbing- resolving edema bilateral lower extremities Skin: blisters on anterior chest wall starting at sternum and going to left chest wall- no open blisters yet- ares is not tender.   Data Reviewed: Basic Metabolic Panel:  Recent Labs Lab 07/25/13 1420 07/25/13 1610 07/25/13 2019 07/26/13 0532 07/27/13 0350 07/28/13 0530  NA 138  --   --  139 134* 137  K 3.8  --   --  4.0 4.3 3.7  CL 97  --   --  98 92* 94*  CO2 23  --   --  23 25 26   GLUCOSE 119*  --   --  110* 209* 112*  BUN 40*  --   --  45* 23 12  CREATININE 8.86*  --  8.91* 9.23* 5.07* 3.22*  CALCIUM 8.6  --   --  8.1* 8.1* 8.1*  MG  --  2.1  --   --   --   --   PHOS  --  4.3  --   --   --   --    Liver Function Tests:  Recent Labs Lab 07/25/13 1610 07/26/13 0532 07/27/13 0350 07/28/13 0530  AST 39* 41* 42* 42*  ALT 11 11 12 12   ALKPHOS 86 75 89 84  BILITOT 0.3 0.3 0.2* 0.2*  PROT 5.7* 5.2* 6.0 6.0  ALBUMIN 2.1* 1.9* 2.1* 2.2*   No results found for this basename: LIPASE, AMYLASE,  in the last 168 hours No results found for this basename: AMMONIA,  in the last 168 hours CBC:  Recent Labs Lab 07/25/13 1420 07/25/13 2019 07/26/13 0532 07/27/13 0350 07/28/13 0530  WBC 7.6 6.0 7.5 4.5 10.8*  NEUTROABS 5.7  --  5.4 3.6 8.8*  HGB 8.5* 8.4* 8.2* 9.0* 9.4*  HCT 26.9* 26.6* 25.8* 27.8* 29.5*  MCV 83.8 85.0 84.3 82.2 84.3  PLT 223 202 227 236 242   Cardiac Enzymes:  Recent  Labs Lab 07/25/13 1610 07/25/13 2300 07/26/13 0532 07/26/13 0755  TROPONINI <0.30 <0.30 <0.30 <0.30   BNP (last 3 results)  Recent Labs  07/01/13 2055 07/25/13 1610  PROBNP >70000.0* >70000.0*   CBG:  Recent Labs Lab 07/26/13 0754 07/27/13 0801 07/28/13 0754 07/29/13 0741  GLUCAP 92 176* 104* 85    Recent Results (from the past 240 hour(s))  CULTURE, BLOOD (ROUTINE X 2)     Status: None   Collection Time    07/25/13  4:10 PM      Result Value Ref Range Status   Specimen Description BLOOD RIGHT ARM   Final   Special Requests BOTTLES DRAWN AEROBIC AND ANAEROBIC 5CC   Final   Culture  Setup Time     Final   Value: 07/25/2013 19:22     Performed at [facility]   Culture     Final   Value: STAPHYLOCOCCUS SPECIES (COAGULASE NEGATIVE)     Note: THE SIGNIFICANCE OF ISOLATING THIS ORGANISM FROM A SINGLE SET OF BLOOD CULTURES WHEN MULTIPLE SETS ARE DRAWN IS UNCERTAIN. PLEASE NOTIFY THE MICROBIOLOGY DEPARTMENT WITHIN ONE WEEK IF SPECIATION AND SENSITIVITIES ARE REQUIRED.     Note: Gram Stain Report Called to,Read Back By and Verified With: [facility] RN [phone]  Performed at [facility]   Report Status 07/28/2013 FINAL   Final  CULTURE, BLOOD (ROUTINE X 2)     Status: None   Collection Time    07/25/13  4:15 PM      Result Value Ref Range Status   Specimen Description BLOOD RIGHT HAND   Final   Special Requests BOTTLES DRAWN AEROBIC ONLY 5CC   Final   Culture  Setup Time     Final   Value: 07/25/2013 19:23     Performed at [facility]   Culture     Final   Value:        BLOOD CULTURE RECEIVED NO GROWTH TO DATE CULTURE WILL BE HELD FOR 5 DAYS BEFORE ISSUING A FINAL NEGATIVE REPORT     Performed at [facility]   Report Status PENDING   Incomplete  URINE CULTURE     Status: None   Collection Time    07/25/13  4:55 PM      Result Value Ref Range Status   Specimen Description URINE, RANDOM   Final   Special Requests NONE   Final    Culture  Setup Time     Final   Value: 07/25/2013 17:20     Performed at [place]     Final   Value: >=100,000 COLONIES/ML     Performed at [facility]   Culture     Final   Value: ESCHERICHIA COLI     Performed at [facility]   Report Status 07/28/2013 FINAL   Final   Organism ID, Bacteria ESCHERICHIA COLI   Final  MRSA PCR SCREENING     Status: None   Collection Time    07/25/13 11:42 PM      Result Value Ref Range Status   MRSA by PCR NEGATIVE  NEGATIVE Final   Comment:            The GeneXpert MRSA Assay (FDA     approved for NASAL specimens     only), is one component of a     comprehensive MRSA colonization     surveillance program. It is not     intended to diagnose MRSA     infection nor to guide or     monitor treatment for     MRSA infections.     Studies:  Recent x-ray studies have been reviewed in detail by the Attending Physician  Scheduled Meds:  Scheduled Meds: . aspirin  325 mg Oral Daily  . atorvastatin  40 mg Oral q1800  . cephALEXin  500 mg Oral Q12H  . cloNIDine  0.1 mg Oral BID  . [START ON 08/03/2013] darbepoetin (ARANESP) injection - DIALYSIS  40 mcg Intravenous Q Wed-HD  . doxercalciferol  4 mcg Intravenous Q M,W,F-HD  . feeding supplement (NEPRO CARB STEADY)  237 mL Oral BID BM  . heparin  2,000 Units Dialysis Once in dialysis  . heparin  5,000 Units Subcutaneous 3 times per day  . labetalol  100 mg Oral TID  . multivitamin  1 tablet Oral QHS  . sodium chloride  3 mL Intravenous Q12H  . valACYclovir  500 mg Oral QHS   Continuous Infusions:   Time spent on care of this patient: 30 min   [place], MD 07/29/2013, 3:52 PM  LOS: 4 days   [facility] Office  [phone] Pager - Text Page per Shea Evans   If 7PM-7AM, please contact  night-coverage [email or web address]

## 2013-07-29 NOTE — Clinical Social Work Placement (Addendum)
Clinical Social Work Department CLINICAL SOCIAL WORK PLACEMENT NOTE 07/29/2013  Patient:  Jaclyn, Morgan  Account Number:  [id number] Admit date:  07/25/2013  Clinical Social Worker:  Shanyah Gattuso Givens, LCSW  Date/time:  07/29/2013 12:01 PM  Clinical Social Work is seeking post-discharge placement for this patient at the following level of care:   SKILLED NURSING   (*CSW will update this form in Epic as items are completed)   07/29/2013  Patient/family provided with [facility] Department of Clinical Social Work's list of facilities offering this level of care within the geographic area requested by the patient (or if unable, by the patient's family).  07/29/2013  Patient/family informed of their freedom to choose among providers that offer the needed level of care, that participate in Medicare, Medicaid or managed care program needed by the patient, have an available bed and are willing to accept the patient.    Patient/family informed of [facility]' ownership interest in [facility], as well as of the fact that they are under no obligation to receive care at this facility.  PASARR submitted to EDS on 07/05/13  PASARR number received on 07/05/13 - [phone]   FL2 transmitted to all facilities in geographic area requested by pt/family on  07/29/2013 FL2 transmitted to all facilities within larger geographic area on   Patient informed that his/her managed care company has contracts with or will negotiate with  certain facilities, including the following:  [employer] authorization [phone]   Patient/family informed of bed offers received: 07/29/13 (after 5 pm) Patient chooses bed at [facility] (see comments below) Physician recommends and patient chooses bed at    Patient to be transferred to [facility] on 08/01/13   Patient to be transferred to facility by ambulance Patient and family notified of transfer on 08/01/13 by phone (message left) Name of family  member notified: Doy Mince - [phone]   The following physician request were entered in Epic:  Additional Comments: 07/29/13: CSW will contact Mendel Corning on Monday, 7/20 regarding bed offer and discharge patient same date to facility if SNF still has bed availability.

## 2013-07-29 NOTE — Progress Notes (Signed)
[facility] Progress Note  Subjective:   No emerging complaints.   Objective Filed Vitals:   07/28/13 1740 07/28/13 2043 07/29/13 0540 07/29/13 0603  BP: 172/62 160/67 219/73 195/90  Pulse: 69 58 61   Temp: 99.2 F (37.3 C) 99.6 F (37.6 C) 98.3 F (36.8 C)   TempSrc: Oral Oral Oral   Resp: 18 19 17    Height:      Weight:  67 kg (147 lb 11.3 oz)    SpO2: 96% 99% 100%    Physical Exam General: Pleasantly confused. NAD. No myoclonus noted. Heart: RRR Lungs:CTA bilat, no wheezes/rhonchi appreciated Abdomen: Soft, NT, +BS Extremities: No LE edema Dialysis Access: Rt I-J TDC, Maturing AVF + bruit  HD: [facility]WF [facility]  4h 71.5kg 450/800 2/2.5 Bath R IJ Cath (maturing LFA AVF) Heparin 2000  Aranesp 40 ug q Wed, Hect 4 ug TIW  Assessment/Plan: 1. AMS -  CT head neg. RPR non-reactive. On keflex for UTI. Off narcotics/sedating medications. Possible worsening delirium post HD in setting of dementia per primary. 2. ESRD - MWF,  HD today  3. Anemia - Hgb 9.4. Cont esa  4. Secondary hyperparathyroidism - Ca/Phos controlled. Cont hectorol.   5. HTN/volume -  SBPs 190s-200s this morning. On po clonidine and labetalol. Under EDW. Try for UF goal of 2-3L today for BP control.  Adjust meds if BP does not improve.  6. Nutrition - alb 2.2. Renal diet. Nepro, multi vit  7. Herpes zoster- Valacyclovir stop date 7/19  8. UTI - ecoli. On keflex  9. Dispo - family requesting SNF  Collene Leyden. Cletus Gash, PA-C [facility] Pager [phone] 07/29/2013,9:17 AM  LOS: 4 days   Pt seen, examined and agree w A/P as above.  Kelly Splinter MD pager [phone]    cell [phone] 07/29/2013, 10:58 AM    Additional Objective Labs: Basic Metabolic Panel:  Recent Labs Lab 07/25/13 1610  07/26/13 0532 07/27/13 0350 07/28/13 0530  NA  --   --  139 134* 137  K  --   --  4.0 4.3 3.7  CL  --   --  98 92* 94*  CO2  --   --  23 25 26   GLUCOSE  --   --  110* 209* 112*  BUN  --   --   45* 23 12  CREATININE  --   < > 9.23* 5.07* 3.22*  CALCIUM  --   --  8.1* 8.1* 8.1*  PHOS 4.3  --   --   --   --   < > = values in this interval not displayed. Liver Function Tests:  Recent Labs Lab 07/26/13 0532 07/27/13 0350 07/28/13 0530  AST 41* 42* 42*  ALT 11 12 12   ALKPHOS 75 89 84  BILITOT 0.3 0.2* 0.2*  PROT 5.2* 6.0 6.0  ALBUMIN 1.9* 2.1* 2.2*   No results found for this basename: LIPASE, AMYLASE,  in the last 168 hours CBC:  Recent Labs Lab 07/25/13 1420 07/25/13 2019 07/26/13 0532 07/27/13 0350 07/28/13 0530  WBC 7.6 6.0 7.5 4.5 10.8*  NEUTROABS 5.7  --  5.4 3.6 8.8*  HGB 8.5* 8.4* 8.2* 9.0* 9.4*  HCT 26.9* 26.6* 25.8* 27.8* 29.5*  MCV 83.8 85.0 84.3 82.2 84.3  PLT 223 202 227 236 242   Blood Culture    Component Value Date/Time   SDES URINE, RANDOM 07/25/2013 1655   SPECREQUEST NONE 07/25/2013 1655   CULT  Value: ESCHERICHIA COLI Performed at  [facility] 07/25/2013 1655   REPTSTATUS 07/28/2013 FINAL 07/25/2013 1655    Cardiac Enzymes:  Recent Labs Lab 07/25/13 1610 07/25/13 2300 07/26/13 0532 07/26/13 0755  TROPONINI <0.30 <0.30 <0.30 <0.30   CBG:  Recent Labs Lab 07/26/13 0754 07/27/13 0801 07/28/13 0754 07/29/13 0741  GLUCAP 92 176* 104* 85    Studies/Results: Ct Head Wo Contrast  07/28/2013   CLINICAL DATA:  Altered mental status  EXAM: CT HEAD WITHOUT CONTRAST  TECHNIQUE: Contiguous axial images were obtained from the base of the skull through the vertex without intravenous contrast.  COMPARISON:  07/01/2013  FINDINGS: Re- demonstrated findings of marked atrophy with sulcal prominence centralized volume loss with commensurate ex vacuo dilatation of the ventricular system. Rather extensive periventricular hypodensities compatible with microvascular ischemic disease. Given background parenchymal abnormalities, there is no CT evidence of acute large territory infarct. No intraparenchymal or extra-axial mass or hemorrhage.  Unchanged size and configuration of the ventricles and basilar cisterns. No midline shift. Intracranial atherosclerosis. Limited visualization of the paranasal sinuses and mastoid air cells are normal. Regional soft tissues are normal. Post right-sided cataract surgery. No displaced calvarial fracture.  IMPRESSION: Grossly unchanged findings of atrophy and microvascular ischemic disease without acute intracranial process.   Electronically Signed   By: Sandi Mariscal M.D.   On: 07/28/2013 18:06   Medications:   . aspirin  325 mg Oral Daily  . atorvastatin  40 mg Oral q1800  . cephALEXin  500 mg Oral Q12H  . cloNIDine  0.1 mg Oral BID  . [START ON 08/03/2013] darbepoetin (ARANESP) injection - DIALYSIS  40 mcg Intravenous Q Wed-HD  . doxercalciferol  4 mcg Intravenous Q M,W,F-HD  . feeding supplement (NEPRO CARB STEADY)  237 mL Oral BID BM  . ferrous sulfate  650 mg Oral Q breakfast  . heparin  2,000 Units Dialysis Once in dialysis  . heparin  5,000 Units Subcutaneous 3 times per day  . labetalol  100 mg Oral TID  . multivitamin  1 tablet Oral QHS  . sodium chloride  3 mL Intravenous Q12H  . valACYclovir  500 mg Oral QHS

## 2013-07-30 DIAGNOSIS — I10 Essential (primary) hypertension: Secondary | ICD-10-CM

## 2013-07-30 LAB — BASIC METABOLIC PANEL
ANION GAP: 12 (ref 5–15)
BUN: 7 mg/dL (ref 6–23)
CHLORIDE: 100 meq/L (ref 96–112)
CO2: 28 meq/L (ref 19–32)
CREATININE: 2.48 mg/dL — AB (ref 0.50–1.10)
Calcium: 8.2 mg/dL — ABNORMAL LOW (ref 8.4–10.5)
GFR calc non Af Amer: 18 mL/min — ABNORMAL LOW (ref >90)
GFR, EST AFRICAN AMERICAN: 20 mL/min — AB (ref >90)
Glucose, Bld: 86 mg/dL (ref 70–99)
POTASSIUM: 4.2 meq/L (ref 3.7–5.3)
Sodium: 140 mEq/L (ref 137–147)

## 2013-07-30 LAB — T4, FREE: FREE T4: 0.95 ng/dL (ref 0.80–1.80)

## 2013-07-30 LAB — GLUCOSE, CAPILLARY: GLUCOSE-CAPILLARY: 85 mg/dL (ref 70–99)

## 2013-07-30 NOTE — Progress Notes (Signed)
[facility] Progress Note   Jaclyn Morgan NFA:[id number] DOB: 08-Sep-1932 DOA: 07/25/2013 PCP: Scarlette Calico, MD  Brief narrative: Jaclyn Morgan is a 80 y.o. female with PMH of ESRD new start on HD about 3 wks ago, HTN, dementia, hypothyroid presenting with generalized weakness. She had missed the past 2 dialysis sessions and was quite confused on admission. She was also found to have shingles on her left anterior chest wall.   I spoke with her daughter who states that the patient has been confused ever since she started dialysis. She notes that she is more confused right after she gets dialysis. She is unable to tell if her mother is getting any better yet.   Subjective: Continues to be pleasantly confused.   Assessment/Plan: Principal Problem:   Metabolic encephalopathy - After speaking with the daughter it appears that patient has worsening of delirium due to dialysis and most likely had dementia prior to starting dialysis. Of note the daughter seems to have been told this by other physicians as well. - Vitamin B 1474 on 6/20 - RPR non-reactive - CT of the head negative for acute CVA -We'll continue to treat her UTI    Active Problems:   Urinary tract infection, site not specified - growing E coli-sensitivities have been posted- switch Rocephin to Keflex-end date 7/19 for a 7 day course  HCAP?? - This was suspected on admission despite the fact that there have been no signs or symptoms of this- d/c'd Vanc and Cefepime on 7/15 as mentioned above- still no signs of pneumonia  Varicella Zoster rash-  - switched Acyclovir to Valacyclovir- total 7 days- stop date 7/19 -was on 125 mg of Solumedrol BID-  D/c'd this as literature does not support its use in this situation    Essential hypertension, benign - has been dialyzed adequately but BP still elevated in 140-150 range- add amlodipine to Catapress, Cardura and Labetalol    Unspecified hypothyroidism - not on any  medications- repeat TSH high but free T4 normal therefore no replacement needed    ESRD (end stage renal disease) on dialysis - per nephrology team    Weakness - no PT ordered- has not been out of bed since admission- will order   Code Status: Full code Family Communication: none Disposition Plan: daughter unable to take care of her at home- requesting SNF- PT agrees that she needs SNF placement- we are waiting on a bed in order to discharge her  Consultants: Nephrology  Procedures: none  Antibiotics: Anti-infectives   Start     Dose/Rate Route Frequency Ordered Stop   07/28/13 1630  cephALEXin (KEFLEX) capsule 500 mg     500 mg Oral Every 12 hours 07/28/13 1544     07/27/13 2000  ceFEPIme (MAXIPIME) 1 g in dextrose 5 % 50 mL IVPB  Status:  Discontinued     1 g 100 mL/hr over 30 Minutes Intravenous Every M-W-F (2000) 07/26/13 1107 07/26/13 1111   07/27/13 2000  ceFEPIme (MAXIPIME) 2 g in dextrose 5 % 50 mL IVPB  Status:  Discontinued     2 g 100 mL/hr over 30 Minutes Intravenous Every M-W-F (2000) 07/26/13 1111 07/27/13 1214   07/27/13 1300  cefTRIAXone (ROCEPHIN) 1 g in dextrose 5 % 50 mL IVPB  Status:  Discontinued     1 g 100 mL/hr over 30 Minutes Intravenous Every 24 hours 07/27/13 1214 07/28/13 1544   07/27/13 1200  vancomycin (VANCOCIN) IVPB 750 mg/150 ml premix  Status:  Discontinued     750 mg 150 mL/hr over 60 Minutes Intravenous Every M-W-F (Hemodialysis) 07/26/13 1107 07/27/13 1214   07/26/13 2200  valACYclovir (VALTREX) tablet 500 mg     500 mg Oral Daily at bedtime 07/26/13 1148     07/26/13 2000  ceFEPIme (MAXIPIME) 1 g in dextrose 5 % 50 mL IVPB  Status:  Discontinued     1 g 100 mL/hr over 30 Minutes Intravenous Every 24 hours 07/25/13 1914 07/25/13 2017   07/26/13 2000  ceFEPIme (MAXIPIME) 1 g in dextrose 5 % 50 mL IVPB     1 g 100 mL/hr over 30 Minutes Intravenous  Once 07/26/13 1107 07/26/13 2106   07/26/13 1700  acyclovir (ZOVIRAX) 365 mg in dextrose 5  % 100 mL IVPB  Status:  Discontinued     5 mg/kg  73 kg 107.3 mL/hr over 60 Minutes Intravenous Every 24 hours 07/25/13 2146 07/26/13 1148   07/26/13 1200  vancomycin (VANCOCIN) IVPB 750 mg/150 ml premix     750 mg 150 mL/hr over 60 Minutes Intravenous Every Tue (Hemodialysis) 07/26/13 1107 07/26/13 1615   07/25/13 2200  ceFEPIme (MAXIPIME) 1 g in dextrose 5 % 50 mL IVPB  Status:  Discontinued     1 g 100 mL/hr over 30 Minutes Intravenous 3 times per day 07/25/13 1952 07/25/13 1954   07/25/13 2200  acyclovir (ZOVIRAX) 5 mg/kg in dextrose 5 % 250 mL IVPB  Status:  Discontinued     5 mg/kg 250 mL/hr over 60 Minutes Intravenous 3 times per day 07/25/13 2009 07/25/13 2013   07/25/13 2200  vancomycin (VANCOCIN) 500 mg in sodium chloride 0.9 % 100 mL IVPB     500 mg 100 mL/hr over 60 Minutes Intravenous  Once 07/25/13 2145 07/26/13 0048   07/25/13 1915  ceFEPIme (MAXIPIME) 2 g in dextrose 5 % 50 mL IVPB     2 g 100 mL/hr over 30 Minutes Intravenous  Once 07/25/13 1856 07/26/13 0100   07/25/13 1900  vancomycin (VANCOCIN) IVPB 1000 mg/200 mL premix     1,000 mg 200 mL/hr over 60 Minutes Intravenous  Once 07/25/13 1856 07/25/13 2006   07/25/13 1830  cefTRIAXone (ROCEPHIN) 1 g in dextrose 5 % 50 mL IVPB  Status:  Discontinued     1 g 100 mL/hr over 30 Minutes Intravenous  Once 07/25/13 1823 07/26/13 0019   07/25/13 1600  acyclovir (ZOVIRAX) 700 mg in dextrose 5 % 100 mL IVPB     700 mg 114 mL/hr over 60 Minutes Intravenous  Once 07/25/13 1548 07/25/13 1817       DVT prophylaxis: heparin  Objective: Filed Weights   07/28/13 2043 07/29/13 1825 07/29/13 2230  Weight: 67 kg (147 lb 11.3 oz) 68 kg (149 lb 14.6 oz) 65.7 kg (144 lb 13.5 oz)    Vitals Filed Vitals:   07/29/13 2228 07/29/13 2230 07/30/13 0630 07/30/13 1000  BP: 156/67 149/93 152/106 192/82  Pulse: 63 58 68 61  Temp:  98.6 F (37 C) 98.6 F (37 C) 98.6 F (37 C)  TempSrc:  Oral Oral Oral  Resp:  18 18 16   Height:       Weight:  65.7 kg (144 lb 13.5 oz)    SpO2:  100% 100% 100%      Intake/Output Summary (Last 24 hours) at 07/30/13 1604 Last data filed at 07/30/13 1300  Gross per 24 hour  Intake    440 ml  Output   2200 ml  Net  -1760 ml     Exam: General: confused, no following all commands, No acute respiratory distress Lungs: will not take deep breaths when asked, decreased breath sounds Cardiovascular: Regular rate and rhythm without murmur gallop or rub normal S1 and S2 Abdomen: Nontender, nondistended, soft, bowel sounds positive, no rebound, no ascites, no appreciable mass Extremities: No significant cyanosis, clubbing- resolving edema bilateral lower extremities Skin: blisters on anterior chest wall starting at sternum and going to left chest wall- no open blisters yet- ares is not tender.   Data Reviewed: Basic Metabolic Panel:  Recent Labs Lab 07/25/13 1420 07/25/13 1610 07/25/13 2019 07/26/13 0532 07/27/13 0350 07/28/13 0530 07/30/13 0508  NA 138  --   --  139 134* 137 140  K 3.8  --   --  4.0 4.3 3.7 4.2  CL 97  --   --  98 92* 94* 100  CO2 23  --   --  23 25 26 28   GLUCOSE 119*  --   --  110* 209* 112* 86  BUN 40*  --   --  45* 23 12 7   CREATININE 8.86*  --  8.91* 9.23* 5.07* 3.22* 2.48*  CALCIUM 8.6  --   --  8.1* 8.1* 8.1* 8.2*  MG  --  2.1  --   --   --   --   --   PHOS  --  4.3  --   --   --   --   --    Liver Function Tests:  Recent Labs Lab 07/25/13 1610 07/26/13 0532 07/27/13 0350 07/28/13 0530  AST 39* 41* 42* 42*  ALT 11 11 12 12   ALKPHOS 86 75 89 84  BILITOT 0.3 0.3 0.2* 0.2*  PROT 5.7* 5.2* 6.0 6.0  ALBUMIN 2.1* 1.9* 2.1* 2.2*   No results found for this basename: LIPASE, AMYLASE,  in the last 168 hours No results found for this basename: AMMONIA,  in the last 168 hours CBC:  Recent Labs Lab 07/25/13 1420 07/25/13 2019 07/26/13 0532 07/27/13 0350 07/28/13 0530  WBC 7.6 6.0 7.5 4.5 10.8*  NEUTROABS 5.7  --  5.4 3.6 8.8*  HGB 8.5* 8.4*  8.2* 9.0* 9.4*  HCT 26.9* 26.6* 25.8* 27.8* 29.5*  MCV 83.8 85.0 84.3 82.2 84.3  PLT 223 202 227 236 242   Cardiac Enzymes:  Recent Labs Lab 07/25/13 1610 07/25/13 2300 07/26/13 0532 07/26/13 0755  TROPONINI <0.30 <0.30 <0.30 <0.30   BNP (last 3 results)  Recent Labs  07/01/13 2055 07/25/13 1610  PROBNP >70000.0* >70000.0*   CBG:  Recent Labs Lab 07/26/13 0754 07/27/13 0801 07/28/13 0754 07/29/13 0741 07/30/13 0847  GLUCAP 92 176* 104* 85 85    Recent Results (from the past 240 hour(s))  CULTURE, BLOOD (ROUTINE X 2)     Status: None   Collection Time    07/25/13  4:10 PM      Result Value Ref Range Status   Specimen Description BLOOD RIGHT ARM   Final   Special Requests BOTTLES DRAWN AEROBIC AND ANAEROBIC 5CC   Final   Culture  Setup Time     Final   Value: 07/25/2013 19:22     Performed at [facility]   Culture     Final   Value: STAPHYLOCOCCUS SPECIES (COAGULASE NEGATIVE)     Note: THE SIGNIFICANCE OF ISOLATING THIS ORGANISM FROM A SINGLE SET OF BLOOD CULTURES WHEN MULTIPLE SETS ARE DRAWN IS UNCERTAIN. PLEASE NOTIFY THE  MICROBIOLOGY DEPARTMENT WITHIN ONE WEEK IF SPECIATION AND SENSITIVITIES ARE REQUIRED.     Note: Gram Stain Report Called to,Read Back By and Verified With: [facility] RN [phone]     Performed at [facility]   Report Status 07/28/2013 FINAL   Final  CULTURE, BLOOD (ROUTINE X 2)     Status: None   Collection Time    07/25/13  4:15 PM      Result Value Ref Range Status   Specimen Description BLOOD RIGHT HAND   Final   Special Requests BOTTLES DRAWN AEROBIC ONLY 5CC   Final   Culture  Setup Time     Final   Value: 07/25/2013 19:23     Performed at [facility]   Culture     Final   Value:        BLOOD CULTURE RECEIVED NO GROWTH TO DATE CULTURE WILL BE HELD FOR 5 DAYS BEFORE ISSUING A FINAL NEGATIVE REPORT     Performed at [facility]   Report Status PENDING   Incomplete  URINE CULTURE     Status: None    Collection Time    07/25/13  4:55 PM      Result Value Ref Range Status   Specimen Description URINE, RANDOM   Final   Special Requests NONE   Final   Culture  Setup Time     Final   Value: 07/25/2013 17:20     Performed at [place]     Final   Value: >=100,000 COLONIES/ML     Performed at [facility]   Culture     Final   Value: ESCHERICHIA COLI     Performed at [facility]   Report Status 07/28/2013 FINAL   Final   Organism ID, Bacteria ESCHERICHIA COLI   Final  MRSA PCR SCREENING     Status: None   Collection Time    07/25/13 11:42 PM      Result Value Ref Range Status   MRSA by PCR NEGATIVE  NEGATIVE Final   Comment:            The GeneXpert MRSA Assay (FDA     approved for NASAL specimens     only), is one component of a     comprehensive MRSA colonization     surveillance program. It is not     intended to diagnose MRSA     infection nor to guide or     monitor treatment for     MRSA infections.     Studies:  Recent x-ray studies have been reviewed in detail by the Attending Physician  Scheduled Meds:  Scheduled Meds: . aspirin  325 mg Oral Daily  . atorvastatin  40 mg Oral q1800  . cephALEXin  500 mg Oral Q12H  . cloNIDine  0.2 mg Oral BID  . [START ON 08/03/2013] darbepoetin (ARANESP) injection - DIALYSIS  40 mcg Intravenous Q Wed-HD  . doxazosin  4 mg Oral BID  . doxercalciferol  4 mcg Intravenous Q M,W,F-HD  . feeding supplement (NEPRO CARB STEADY)  237 mL Oral BID BM  . heparin  2,000 Units Dialysis Once in dialysis  . heparin  2,000 Units Dialysis Once in dialysis  . heparin  5,000 Units Subcutaneous 3 times per day  . labetalol  100 mg Oral TID  . multivitamin  1 tablet Oral QHS  . sodium chloride  3 mL Intravenous Q12H  .  valACYclovir  500 mg Oral QHS   Continuous Infusions:   Time spent on care of this patient: 30 min   [place], MD 07/30/2013, 4:04 PM  LOS: 5 days   [facility] Office  [phone] Pager - Text Page per Shea Evans   If 7PM-7AM, please contact night-coverage [email or web address]

## 2013-07-30 NOTE — Progress Notes (Signed)
[facility] Progress Note  Subjective:   No emerging complaints  Objective Filed Vitals:   07/29/13 2225 07/29/13 2228 07/29/13 2230 07/30/13 0630  BP:  156/67 149/93 152/106  Pulse:  63 58 68  Temp:   98.6 F (37 C) 98.6 F (37 C)  TempSrc:   Oral Oral  Resp:   18 18  Height:      Weight:   65.7 kg (144 lb 13.5 oz)   SpO2: 100%  100% 100%   Physical Exam General: elderly, pleasantly confused, NAD Heart: RRR Lungs: CTA bilat, no wheezes/rales Abdomen: Soft, NT, + BS Extremities: No LE edema Dialysis Access:  Rt I-J TDC/ maturing RFA AVF + bruit  HD: MWF [facility]  4h 71.5kg 450/800 2/2.5 Bath R IJ Cath (maturing LFA AVF) Heparin 2000  Aranesp 40 ug q Wed, Hect 4 ug TIW   Assessment/Plan: 1. AMS - CT head neg. RPR non-reactive. On keflex for UTI. Off narcotics/sedating medications. Possible worsening delirium post HD in setting of dementia per primary. 2. ESRD - MWF, K+ 4.2. Next HD Monday 1st shift 3. Anemia - Hgb 9.4. Cont esa  4. Secondary hyperparathyroidism - Ca/Phos controlled. Cont hectorol.  5. HTN/volume - SBPs now 140s-150s s/p net UF 2.2L yesterday.  On po clonidine and labetalol. Anticipate new edw ~66kg 6. Nutrition - alb 2.2. Renal diet. Nepro, multi vit  7. Herpes zoster- Valacyclovir stop date 7/19  8. UTI - ecoli. On keflex  9. Dispo - To [facility], possibly Monday if bed still available. Per [place] Rhodia Albright [facility] Pager [phone] 07/30/2013,11:06 AM  LOS: 5 days   Pt seen, examined and agree w A/P as above. Altered MS never cleared up with HD as we were hoping and it appears that her disorientation may be a chronic issue more related to dementia. No new suggestions.          Kelly Splinter MD pager [phone]    cell [phone] 07/30/2013, 11:17 AM     Additional Objective Labs: Basic Metabolic Panel:  Recent Labs Lab 07/25/13 1610  07/27/13 0350 07/28/13 0530 07/30/13 0508  NA  --   < >  134* 137 140  K  --   < > 4.3 3.7 4.2  CL  --   < > 92* 94* 100  CO2  --   < > 25 26 28   GLUCOSE  --   < > 209* 112* 86  BUN  --   < > 23 12 7   CREATININE  --   < > 5.07* 3.22* 2.48*  CALCIUM  --   < > 8.1* 8.1* 8.2*  PHOS 4.3  --   --   --   --   < > = values in this interval not displayed. Liver Function Tests:  Recent Labs Lab 07/26/13 0532 07/27/13 0350 07/28/13 0530  AST 41* 42* 42*  ALT 11 12 12   ALKPHOS 75 89 84  BILITOT 0.3 0.2* 0.2*  PROT 5.2* 6.0 6.0  ALBUMIN 1.9* 2.1* 2.2*   CBC:  Recent Labs Lab 07/25/13 1420 07/25/13 2019 07/26/13 0532 07/27/13 0350 07/28/13 0530  WBC 7.6 6.0 7.5 4.5 10.8*  NEUTROABS 5.7  --  5.4 3.6 8.8*  HGB 8.5* 8.4* 8.2* 9.0* 9.4*  HCT 26.9* 26.6* 25.8* 27.8* 29.5*  MCV 83.8 85.0 84.3 82.2 84.3  PLT 223 202 227 236 242   Blood Culture    Component Value Date/Time  SDES URINE, RANDOM 07/25/2013 1655   SPECREQUEST NONE 07/25/2013 1655   CULT  Value: ESCHERICHIA COLI Performed at [facility] 07/25/2013 1655   REPTSTATUS 07/28/2013 FINAL 07/25/2013 1655    Cardiac Enzymes:  Recent Labs Lab 07/25/13 1610 07/25/13 2300 07/26/13 0532 07/26/13 0755  TROPONINI <0.30 <0.30 <0.30 <0.30   CBG:  Recent Labs Lab 07/26/13 0754 07/27/13 0801 07/28/13 0754 07/29/13 0741 07/30/13 0847  GLUCAP 92 176* 104* 85 85   Studies/Results: Ct Head Wo Contrast  07/28/2013   CLINICAL DATA:  Altered mental status  EXAM: CT HEAD WITHOUT CONTRAST  TECHNIQUE: Contiguous axial images were obtained from the base of the skull through the vertex without intravenous contrast.  COMPARISON:  07/01/2013  FINDINGS: Re- demonstrated findings of marked atrophy with sulcal prominence centralized volume loss with commensurate ex vacuo dilatation of the ventricular system. Rather extensive periventricular hypodensities compatible with microvascular ischemic disease. Given background parenchymal abnormalities, there is no CT evidence of acute large  territory infarct. No intraparenchymal or extra-axial mass or hemorrhage. Unchanged size and configuration of the ventricles and basilar cisterns. No midline shift. Intracranial atherosclerosis. Limited visualization of the paranasal sinuses and mastoid air cells are normal. Regional soft tissues are normal. Post right-sided cataract surgery. No displaced calvarial fracture.  IMPRESSION: Grossly unchanged findings of atrophy and microvascular ischemic disease without acute intracranial process.   Electronically Signed   By: Sandi Mariscal M.D.   On: 07/28/2013 18:06   Medications:   . aspirin  325 mg Oral Daily  . atorvastatin  40 mg Oral q1800  . cephALEXin  500 mg Oral Q12H  . cloNIDine  0.2 mg Oral BID  . [START ON 08/03/2013] darbepoetin (ARANESP) injection - DIALYSIS  40 mcg Intravenous Q Wed-HD  . doxazosin  4 mg Oral BID  . doxercalciferol  4 mcg Intravenous Q M,W,F-HD  . feeding supplement (NEPRO CARB STEADY)  237 mL Oral BID BM  . heparin  2,000 Units Dialysis Once in dialysis  . heparin  2,000 Units Dialysis Once in dialysis  . heparin  5,000 Units Subcutaneous 3 times per day  . labetalol  100 mg Oral TID  . multivitamin  1 tablet Oral QHS  . sodium chloride  3 mL Intravenous Q12H  . valACYclovir  500 mg Oral QHS

## 2013-07-31 LAB — CULTURE, BLOOD (ROUTINE X 2): Culture: NO GROWTH

## 2013-07-31 MED ORDER — LABETALOL HCL 200 MG PO TABS
200.0000 mg | ORAL_TABLET | Freq: Three times a day (TID) | ORAL | Status: DC
  Administered 2013-07-31 (×2): 200 mg via ORAL
  Filled 2013-07-31 (×7): qty 1

## 2013-07-31 MED ORDER — HYDRALAZINE HCL 20 MG/ML IJ SOLN: 10.0000 mg | Freq: Three times a day (TID) | INTRAMUSCULAR | Status: DC | PRN

## 2013-07-31 MED ORDER — CLONIDINE HCL 0.2 MG PO TABS
0.2000 mg | ORAL_TABLET | Freq: Three times a day (TID) | ORAL | Status: DC
  Administered 2013-07-31 (×2): 0.2 mg via ORAL
  Filled 2013-07-31 (×6): qty 1

## 2013-07-31 NOTE — Progress Notes (Addendum)
[facility] PROGRESS NOTE  [name] TMA:[id number] DOB: 1932-07-05 DOA: 07/25/2013 PCP: Scarlette Calico, MD  Assessment/Plan: Principal Problem:   Metabolic encephalopathy Active Problems:   Essential hypertension, benign   Unspecified hypothyroidism   ESRD (end stage renal disease) on dialysis   Weakness   HCAP (healthcare-associated pneumonia)   Rash   Urinary tract infection, site not specified     Brief narrative:  Jaclyn Morgan is a 80 y.o. female with PMH of ESRD new start on HD about 3 wks ago, HTN, dementia, hypothyroid presenting with generalized weakness. She had missed the past 2 dialysis sessions and was quite confused on admission. She was also found to have shingles on her left anterior chest wall.  I spoke with her daughter who states that the patient has been confused ever since she started dialysis. She notes that she is more confused right after she gets dialysis. She is unable to tell if her mother is getting any better yet.  Subjective:  Continues to be pleasantly confused.  Assessment/Plan:   Metabolic encephalopathy  - After speaking with the daughter it appears that patient has worsening of delirium due to dialysis and most likely had dementia prior to starting dialysis. Of note the daughter seems to have been told this by other physicians as well.  - Vitamin B 1474 on 6/20  - RPR non-reactive  - CT of the head negative for acute CVA  -We'll continue to treat her UTI    Urinary tract infection, site not specified  - growing E coli-sensitivities have been posted- switch Rocephin to Keflex-end date 7/19 for a 7 day course   HCAP??  - This was suspected on admission despite the fact that there have been no signs or symptoms of this- d/c'd Vanc and Cefepime on 7/15 as mentioned above- still no signs of pneumonia   Varicella Zoster rash-  - switched Acyclovir to Valacyclovir- total 7 days- stop date 7/19  -was on 125 mg of Solumedrol BID- D/c'd  this as literature does not support its use in this situation   Essential hypertension,uncontrolled  - has been dialyzed adequately but BP still elevated in 140-150 range-  Increase clonidine to tid   Cardura and Labetalol INCREASE TO 200MG TID  Managed by renal   Unspecified hypothyroidism  - not on any medications- repeat TSH high but free T4 normal therefore no replacement needed   ESRD (end stage renal disease) on dialysis  - per nephrology team   Weakness  - no PT ordered- has not been out of bed since admission- will order   Code Status: Full code  Family Communication: none  Disposition Plan: daughter unable to take care of her at home- requesting SNF- PT agrees that she needs SNF placement- we are waiting on a bed in order to discharge her tomorrow  Consultants:  Nephrology  Procedures:  none      Objective: Filed Vitals:   07/30/13 1000 07/30/13 1644 07/30/13 2203 07/31/13 0538  BP: 192/82 175/73 173/64 167/68  Pulse: 61 64 63 65  Temp: 98.6 F (37 C)  98.2 F (36.8 C) 97.9 F (36.6 C)  TempSrc: Oral  Oral Oral  Resp: 16  17   Height:      Weight:      SpO2: 100% 99% 100% 100%    Intake/Output Summary (Last 24 hours) at 07/31/13 1018 Last data filed at 07/31/13 0900  Gross per 24 hour  Intake    290 ml  Output      0 ml  Net    290 ml    Exam:  General: Elderly, partially oriented to place. "Jaclyn Morgan, Jaclyn Morgan". NAD  Heart: RRR  Lungs: CTA, no appreciable wheezes, rhonchi  Abdomen: Soft, NT, + BS  Extremities: No LE edema  Dialysis Access: Rt I-J TDC, Maturing Rt AVF + bruit    Data Reviewed: Basic Metabolic Panel:  Recent Labs Lab 07/25/13 1420 07/25/13 1610 07/25/13 2019 07/26/13 0532 07/27/13 0350 07/28/13 0530 07/30/13 0508  NA 138  --   --  139 134* 137 140  K 3.8  --   --  4.0 4.3 3.7 4.2  CL 97  --   --  98 92* 94* 100  CO2 23  --   --  23 25 26 28   GLUCOSE 119*  --   --  110* 209* 112* 86  BUN 40*  --   --  45* 23 12 7    CREATININE 8.86*  --  8.91* 9.23* 5.07* 3.22* 2.48*  CALCIUM 8.6  --   --  8.1* 8.1* 8.1* 8.2*  MG  --  2.1  --   --   --   --   --   PHOS  --  4.3  --   --   --   --   --     Liver Function Tests:  Recent Labs Lab 07/25/13 1610 07/26/13 0532 07/27/13 0350 07/28/13 0530  AST 39* 41* 42* 42*  ALT 11 11 12 12   ALKPHOS 86 75 89 84  BILITOT 0.3 0.3 0.2* 0.2*  PROT 5.7* 5.2* 6.0 6.0  ALBUMIN 2.1* 1.9* 2.1* 2.2*   No results found for this basename: LIPASE, AMYLASE,  in the last 168 hours No results found for this basename: AMMONIA,  in the last 168 hours  CBC:  Recent Labs Lab 07/25/13 1420 07/25/13 2019 07/26/13 0532 07/27/13 0350 07/28/13 0530  WBC 7.6 6.0 7.5 4.5 10.8*  NEUTROABS 5.7  --  5.4 3.6 8.8*  HGB 8.5* 8.4* 8.2* 9.0* 9.4*  HCT 26.9* 26.6* 25.8* 27.8* 29.5*  MCV 83.8 85.0 84.3 82.2 84.3  PLT 223 202 227 236 242    Cardiac Enzymes:  Recent Labs Lab 07/25/13 1610 07/25/13 2300 07/26/13 0532 07/26/13 0755  TROPONINI <0.30 <0.30 <0.30 <0.30   BNP (last 3 results)  Recent Labs  07/01/13 2055 07/25/13 1610  PROBNP >70000.0* >70000.0*     CBG:  Recent Labs Lab 07/26/13 0754 07/27/13 0801 07/28/13 0754 07/29/13 0741 07/30/13 0847  GLUCAP 92 176* 104* 85 85    Recent Results (from the past 240 hour(s))  CULTURE, BLOOD (ROUTINE X 2)     Status: None   Collection Time    07/25/13  4:10 PM      Result Value Ref Range Status   Specimen Description BLOOD RIGHT ARM   Final   Special Requests BOTTLES DRAWN AEROBIC AND ANAEROBIC 5CC   Final   Culture  Setup Time     Final   Value: 07/25/2013 19:22     Performed at [facility]   Culture     Final   Value: STAPHYLOCOCCUS SPECIES (COAGULASE NEGATIVE)     Note: THE SIGNIFICANCE OF ISOLATING THIS ORGANISM FROM A SINGLE SET OF BLOOD CULTURES WHEN MULTIPLE SETS ARE DRAWN IS UNCERTAIN. PLEASE NOTIFY THE MICROBIOLOGY DEPARTMENT WITHIN ONE WEEK IF SPECIATION AND SENSITIVITIES ARE REQUIRED.      Note: Gram Stain Report Called to,Read Back By  and Verified With: [facility] RN [phone]     Performed at [facility]   Report Status 07/28/2013 FINAL   Final  CULTURE, BLOOD (ROUTINE X 2)     Status: None   Collection Time    07/25/13  4:15 PM      Result Value Ref Range Status   Specimen Description BLOOD RIGHT HAND   Final   Special Requests BOTTLES DRAWN AEROBIC ONLY 5CC   Final   Culture  Setup Time     Final   Value: 07/25/2013 19:23     Performed at [facility]   Culture     Final   Value:        BLOOD CULTURE RECEIVED NO GROWTH TO DATE CULTURE WILL BE HELD FOR 5 DAYS BEFORE ISSUING A FINAL NEGATIVE REPORT     Performed at [facility]   Report Status PENDING   Incomplete  URINE CULTURE     Status: None   Collection Time    07/25/13  4:55 PM      Result Value Ref Range Status   Specimen Description URINE, RANDOM   Final   Special Requests NONE   Final   Culture  Setup Time     Final   Value: 07/25/2013 17:20     Performed at [place]     Final   Value: >=100,000 COLONIES/ML     Performed at [facility]   Culture     Final   Value: ESCHERICHIA COLI     Performed at [facility]   Report Status 07/28/2013 FINAL   Final   Organism ID, Bacteria ESCHERICHIA COLI   Final  MRSA PCR SCREENING     Status: None   Collection Time    07/25/13 11:42 PM      Result Value Ref Range Status   MRSA by PCR NEGATIVE  NEGATIVE Final   Comment:            The GeneXpert MRSA Assay (FDA     approved for NASAL specimens     only), is one component of a     comprehensive MRSA colonization     surveillance program. It is not     intended to diagnose MRSA     infection nor to guide or     monitor treatment for     MRSA infections.     Studies: Dg Chest 2 View  07/01/2013   CLINICAL DATA:  Fall  EXAM: CHEST  2 VIEW  COMPARISON:  Radiograph 12/28/2012  FINDINGS: Cardiac silhouette is mildly enlarged. There is a new  right pleural effusion. Concern for right lower lobe airspace disease additionally.  IMPRESSION: 1. New right pleural effusion. 2. Right lobe atelectasis versus infiltrate.   Electronically Signed   By: Suzy Bouchard M.D.   On: 07/01/2013 22:40   Ct Head Wo Contrast  07/28/2013   CLINICAL DATA:  Altered mental status  EXAM: CT HEAD WITHOUT CONTRAST  TECHNIQUE: Contiguous axial images were obtained from the base of the skull through the vertex without intravenous contrast.  COMPARISON:  07/01/2013  FINDINGS: Re- demonstrated findings of marked atrophy with sulcal prominence centralized volume loss with commensurate ex vacuo dilatation of the ventricular system. Rather extensive periventricular hypodensities compatible with microvascular ischemic disease. Given background parenchymal abnormalities, there is no CT evidence of acute large territory infarct. No intraparenchymal or extra-axial mass or hemorrhage. Unchanged size and configuration  of the ventricles and basilar cisterns. No midline shift. Intracranial atherosclerosis. Limited visualization of the paranasal sinuses and mastoid air cells are normal. Regional soft tissues are normal. Post right-sided cataract surgery. No displaced calvarial fracture.  IMPRESSION: Grossly unchanged findings of atrophy and microvascular ischemic disease without acute intracranial process.   Electronically Signed   By: Sandi Mariscal M.D.   On: 07/28/2013 18:06   Ct Head Wo Contrast  07/01/2013   CLINICAL DATA:  80 year old female with altered mental status following fall. Hypertension.  EXAM: CT HEAD WITHOUT CONTRAST  TECHNIQUE: Contiguous axial images were obtained from the base of the skull through the vertex without intravenous contrast.  COMPARISON:  None.  FINDINGS: Moderate chronic small-vessel white matter ischemic changes are noted.  No acute intracranial abnormalities are identified, including mass lesion or mass effect, hydrocephalus, extra-axial fluid collection,  midline shift, hemorrhage, or acute infarction.  The visualized bony calvarium is unremarkable.  Mild left forehead soft tissue swelling is.  IMPRESSION: No evidence of acute intracranial abnormality.  Mild left forehead soft tissue swelling without fracture.  Moderate chronic small-vessel white matter ischemic changes.   Electronically Signed   By: Hassan Rowan M.D.   On: 07/01/2013 21:50   Ir Fluoro Guide Cv Line Right  07/02/2013   INDICATION: 80 year old female with acute on chronic renal failure in need of urgent hemodialysis secondary to uremia. Placement of a tunneled HD catheter is warranted to facilitate urgent hemodialysis.  EXAM: TUNNELED CENTRAL VENOUS HEMODIALYSIS CATHETER PLACEMENT WITH ULTRASOUND AND FLUOROSCOPIC GUIDANCE  MEDICATIONS: 1 g vancomycin The IV antibiotic was given in an appropriate time interval prior to skin puncture.  CONTRAST:  None  ANESTHESIA/SEDATION: Versed 2 mg IV; Fentanyl 50 mcg IV  Total Moderate Sedation Time  Fifteen minutes.  FLUOROSCOPY TIME:  18 seconds  COMPLICATIONS: None immediate  PROCEDURE: Informed written consent was obtained from the patient's daughter after a discussion of the risks, benefits, and alternatives to treatment. Questions regarding the procedure were encouraged and answered. The right neck and chest were prepped with chlorhexidine in a sterile fashion, and a sterile drape was applied covering the operative field. Maximum barrier sterile technique with sterile gowns and gloves were used for the procedure. A timeout was performed prior to the initiation of the procedure.  After creating a small venotomy incision, a micropuncture kit was utilized to access the right internal jugular vein under direct, real-time ultrasound guidance after the overlying soft tissues were anesthetized with 1% lidocaine with epinephrine. Ultrasound image documentation was performed. The microwire was kinked to measure appropriate catheter length. A stiff glidewire was advanced  to the level of the IVC and the micropuncture sheath was exchanged for a peel-away sheath. A Bard hemo split tunneled hemodialysis catheter measuring 19 cm from tip to cuff was tunneled in a retrograde fashion from the anterior chest wall to the venotomy incision.  The catheter was then placed through the peel-away sheath with tips ultimately positioned within the superior aspect of the right atrium. Final catheter positioning was confirmed and documented with a spot radiographic image. The catheter aspirates and flushes normally. The catheter was flushed with appropriate volume heparin dwells.  The catheter exit site was secured with a 0-Prolene retention suture. The venotomy incision was closed with an interrupted 4-0 Vicryl, Dermabond and Steri-strips. Dressings were applied. The patient tolerated the procedure well without immediate post procedural complication.  IMPRESSION: Successful placement of 19 cm tip to cuff tunneled hemodialysis catheter via the right internal jugular vein  with tips terminating within the superior aspect of the right atrium. The catheter is ready for immediate use.  Signed,  Criselda Peaches, MD  [facility]  [facility]   Electronically Signed   By: Jacqulynn Cadet M.D.   On: 07/02/2013 12:02   Ir US Guide Vasc Access Right  07/02/2013   INDICATION: 80 year old female with acute on chronic renal failure in need of urgent hemodialysis secondary to uremia. Placement of a tunneled HD catheter is warranted to facilitate urgent hemodialysis.  EXAM: TUNNELED CENTRAL VENOUS HEMODIALYSIS CATHETER PLACEMENT WITH ULTRASOUND AND FLUOROSCOPIC GUIDANCE  MEDICATIONS: 1 g vancomycin The IV antibiotic was given in an appropriate time interval prior to skin puncture.  CONTRAST:  None  ANESTHESIA/SEDATION: Versed 2 mg IV; Fentanyl 50 mcg IV  Total Moderate Sedation Time  Fifteen minutes.  FLUOROSCOPY TIME:  18 seconds  COMPLICATIONS: None immediate   PROCEDURE: Informed written consent was obtained from the patient's daughter after a discussion of the risks, benefits, and alternatives to treatment. Questions regarding the procedure were encouraged and answered. The right neck and chest were prepped with chlorhexidine in a sterile fashion, and a sterile drape was applied covering the operative field. Maximum barrier sterile technique with sterile gowns and gloves were used for the procedure. A timeout was performed prior to the initiation of the procedure.  After creating a small venotomy incision, a micropuncture kit was utilized to access the right internal jugular vein under direct, real-time ultrasound guidance after the overlying soft tissues were anesthetized with 1% lidocaine with epinephrine. Ultrasound image documentation was performed. The microwire was kinked to measure appropriate catheter length. A stiff glidewire was advanced to the level of the IVC and the micropuncture sheath was exchanged for a peel-away sheath. A Bard hemo split tunneled hemodialysis catheter measuring 19 cm from tip to cuff was tunneled in a retrograde fashion from the anterior chest wall to the venotomy incision.  The catheter was then placed through the peel-away sheath with tips ultimately positioned within the superior aspect of the right atrium. Final catheter positioning was confirmed and documented with a spot radiographic image. The catheter aspirates and flushes normally. The catheter was flushed with appropriate volume heparin dwells.  The catheter exit site was secured with a 0-Prolene retention suture. The venotomy incision was closed with an interrupted 4-0 Vicryl, Dermabond and Steri-strips. Dressings were applied. The patient tolerated the procedure well without immediate post procedural complication.  IMPRESSION: Successful placement of 19 cm tip to cuff tunneled hemodialysis catheter via the right internal jugular vein with tips terminating within the superior  aspect of the right atrium. The catheter is ready for immediate use.  Signed,  Criselda Peaches, MD  [facility]  [facility]   Electronically Signed   By: Jacqulynn Cadet M.D.   On: 07/02/2013 12:02   Dg Chest Port 1 View  07/25/2013   CLINICAL DATA:  Weakness.  EXAM: PORTABLE CHEST - 1 VIEW  COMPARISON:  07/02/2013.  FINDINGS: Dialysis catheter in stable position. Interval removal of left IJ line. Mediastinum and hilar structures are unremarkable. Stable cardiomegaly. Mild basilar atelectasis. Mild infiltrate right lung base cannot be excluded. No significant pleural effusion or pneumothorax.  IMPRESSION: 1. Interval removal of left IJ line. Dialysis catheter in stable position. 2. Stable cardiomegaly. 3. Mild basilar atelectasis. Mild pneumonia right lung base cannot be excluded.   Electronically Signed   By: Marcello Moores  Register   On: 07/25/2013 14:29  Dg Chest Portable 1 View  07/02/2013   CLINICAL DATA:  Left central line placement.  EXAM: PORTABLE CHEST - 1 VIEW  COMPARISON:  Chest radiograph from 07/01/2013  FINDINGS: A left IJ line is seen ending about the distal SVC.  The small right-sided pleural effusion has improved. Mild residual right basilar airspace opacity is seen. Mild vascular congestion is noted. No pneumothorax is identified.  The cardiomediastinal silhouette is borderline enlarged. No acute osseous abnormalities are seen.  IMPRESSION: 1. Left IJ line seen ending about the distal SVC. 2. Small right-sided pleural effusion has improved. Mild residual right basilar airspace opacity may reflect atelectasis or pneumonia. Mild vascular congestion and borderline cardiomegaly.   Electronically Signed   By: Garald Balding M.D.   On: 07/02/2013 05:03   Ct Maxillofacial Wo Cm  07/01/2013   CLINICAL DATA:  Fall, left black eye.  EXAM: CT MAXILLOFACIAL WITHOUT CONTRAST  TECHNIQUE: Multidetector CT imaging of the maxillofacial structures was  performed. Multiplanar CT image reconstructions were also generated. A small metallic BB was placed on the right temple in order to reliably differentiate right from left.  COMPARISON:  None.  FINDINGS: Left periorbital/supra or soft tissue swelling with contusion is present. Hyperdensity within the left anterior chamber itself may represent blood. The globes are intact without evidence of acute injury. No retro-orbital hematoma or other pathology. Intraconal and extraconal fat are well well preserved.  The bony orbits are intact without evidence of orbital floor fracture. Zygomatic arches are intact.  Maxilla is intact. No nasal bone fracture. Mandible is intact. Mandibular condyles are normally positioned within the temporomandibular fossa.  Paranasal sinuses are well pneumatized and free of fluid. No mastoid effusion.  A advanced atrophy noted within the partially visualized right  IMPRESSION: 1. Left periorbital/supraorbital contusion. 2. Intact globes. Hyperdensity within the left anterior chamber suggestive of hemorrhage. Correlation with physical recommended. No retro-orbital hematoma or other pathology. 3. No acute maxillofacial fracture.   Electronically Signed   By: Jeannine Boga M.D.   On: 07/01/2013 22:33    Scheduled Meds: . aspirin  325 mg Oral Daily  . atorvastatin  40 mg Oral q1800  . cephALEXin  500 mg Oral Q12H  . cloNIDine  0.2 mg Oral BID  . [START ON 08/03/2013] darbepoetin (ARANESP) injection - DIALYSIS  40 mcg Intravenous Q Wed-HD  . doxazosin  4 mg Oral BID  . doxercalciferol  4 mcg Intravenous Q M,W,F-HD  . feeding supplement (NEPRO CARB STEADY)  237 mL Oral BID BM  . heparin  2,000 Units Dialysis Once in dialysis  . heparin  2,000 Units Dialysis Once in dialysis  . heparin  5,000 Units Subcutaneous 3 times per day  . labetalol  100 mg Oral TID  . multivitamin  1 tablet Oral QHS  . sodium chloride  3 mL Intravenous Q12H  . valACYclovir  500 mg Oral QHS   Continuous  Infusions:   Principal Problem:   Metabolic encephalopathy Active Problems:   Essential hypertension, benign   Unspecified hypothyroidism   ESRD (end stage renal disease) on dialysis   Weakness   HCAP (healthcare-associated pneumonia)   Rash   Urinary tract infection, site not specified    Time spent: 40 minutes   [facility] Pager [phone]. If 8PM-8AM, please contact night-coverage at [email or web address], password [facility] 07/31/2013, 10:18 AM  LOS: 6 days

## 2013-07-31 NOTE — Progress Notes (Signed)
[facility] Progress Note  Subjective:   Pleasantly confused. Admires the isolation gown.   Objective Filed Vitals:   07/30/13 1000 07/30/13 1644 07/30/13 2203 07/31/13 0538  BP: 192/82 175/73 173/64 167/68  Pulse: 61 64 63 65  Temp: 98.6 F (37 C)  98.2 F (36.8 C) 97.9 F (36.6 C)  TempSrc: Oral  Oral Oral  Resp: 16  17   Height:      Weight:      SpO2: 100% 99% 100% 100%   Physical Exam General: Elderly, partially oriented to place. "[place], [place]". NAD Heart: RRR Lungs: CTA, no appreciable wheezes, rhonchi Abdomen: Soft, NT, + BS Extremities: No LE edema Dialysis Access: Rt I-J TDC, Maturing Rt AVF + bruit  HD: [facility]  4h 71.5kg 450/800 2/2.5 [place] R IJ Cath (maturing LFA AVF) Heparin 2000  Aranesp 40 ug q Wed, Hect 4 ug TIW   Assessment/Plan: 1. AMS - CT head neg. RPR non-reactive. On keflex for UTI. Off narcotics/sedating medications. Possible worsening delirium post HD in setting of dementia per primary. 2. ESRD - MWF, K+ 4.2. Next HD tomorrow 1st shift 3. Anemia - Hgb 9.4, improving. Cont esa  4. Secondary hyperparathyroidism - Ca/Phos controlled. Cont hectorol.  5. HTN/volume - SBPs 160s-170s today. On po clonidine and labetalol. Challenge edw tomorrow. Anticipate new edw ~65.5kg. May need clonidine transdermal or alternate BP agent. 6. Nutrition - alb 2.2. Renal diet. Nepro, multi vit  7. Herpes zoster- Valacyclovir stop date 7/19  8. UTI - ecoli. On keflex  9. Dispo - To [facility], possibly Monday if bed still available. Per [place] [facility] Pager [phone] 07/31/2013,9:24 AM  LOS: 6 days   Pt seen, examined, agree w assess/plan as above with additions as indicated.  Kelly Splinter MD pager [phone]    cell [phone] 07/31/2013, 4:03 PM     Additional Objective Labs: Basic Metabolic Panel:  Recent Labs Lab 07/25/13 1610  07/27/13 0350 07/28/13 0530 07/30/13 0508  NA  --   < >  134* 137 140  K  --   < > 4.3 3.7 4.2  CL  --   < > 92* 94* 100  CO2  --   < > 25 26 28   GLUCOSE  --   < > 209* 112* 86  BUN  --   < > 23 12 7   CREATININE  --   < > 5.07* 3.22* 2.48*  CALCIUM  --   < > 8.1* 8.1* 8.2*  PHOS 4.3  --   --   --   --   < > = values in this interval not displayed. Liver Function Tests:  Recent Labs Lab 07/26/13 0532 07/27/13 0350 07/28/13 0530  AST 41* 42* 42*  ALT 11 12 12   ALKPHOS 75 89 84  BILITOT 0.3 0.2* 0.2*  PROT 5.2* 6.0 6.0  ALBUMIN 1.9* 2.1* 2.2*   CBC:  Recent Labs Lab 07/25/13 1420 07/25/13 2019 07/26/13 0532 07/27/13 0350 07/28/13 0530  WBC 7.6 6.0 7.5 4.5 10.8*  NEUTROABS 5.7  --  5.4 3.6 8.8*  HGB 8.5* 8.4* 8.2* 9.0* 9.4*  HCT 26.9* 26.6* 25.8* 27.8* 29.5*  MCV 83.8 85.0 84.3 82.2 84.3  PLT 223 202 227 236 242   Blood Culture    Component Value Date/Time   SDES URINE, RANDOM 07/25/2013 1655   SPECREQUEST NONE 07/25/2013 1655   CULT  Value: ESCHERICHIA COLI Performed at [facility] 07/25/2013 1655   REPTSTATUS 07/28/2013 FINAL 07/25/2013 1655    Cardiac Enzymes:  Recent Labs Lab 07/25/13 1610 07/25/13 2300 07/26/13 0532 07/26/13 0755  TROPONINI <0.30 <0.30 <0.30 <0.30   CBG:  Recent Labs Lab 07/26/13 0754 07/27/13 0801 07/28/13 0754 07/29/13 0741 07/30/13 0847  GLUCAP 92 176* 104* 85 85    Studies/Results: No results found. Medications:   . aspirin  325 mg Oral Daily  . atorvastatin  40 mg Oral q1800  . cephALEXin  500 mg Oral Q12H  . cloNIDine  0.2 mg Oral BID  . [START ON 08/03/2013] darbepoetin (ARANESP) injection - DIALYSIS  40 mcg Intravenous Q Wed-HD  . doxazosin  4 mg Oral BID  . doxercalciferol  4 mcg Intravenous Q M,W,F-HD  . feeding supplement (NEPRO CARB STEADY)  237 mL Oral BID BM  . heparin  2,000 Units Dialysis Once in dialysis  . heparin  2,000 Units Dialysis Once in dialysis  . heparin  5,000 Units Subcutaneous 3 times per day  . labetalol  100 mg Oral TID  .  multivitamin  1 tablet Oral QHS  . sodium chloride  3 mL Intravenous Q12H  . valACYclovir  500 mg Oral QHS

## 2013-08-01 DIAGNOSIS — F05 Delirium due to known physiological condition: Secondary | ICD-10-CM | POA: Diagnosis present

## 2013-08-01 LAB — CBC
HCT: 32.3 % — ABNORMAL LOW (ref 36.0–46.0)
Hemoglobin: 10 g/dL — ABNORMAL LOW (ref 12.0–15.0)
MCH: 27 pg (ref 26.0–34.0)
MCHC: 31 g/dL (ref 30.0–36.0)
MCV: 87.1 fL (ref 78.0–100.0)
PLATELETS: 225 10*3/uL (ref 150–400)
RBC: 3.71 MIL/uL — ABNORMAL LOW (ref 3.87–5.11)
RDW: 14.9 % (ref 11.5–15.5)
WBC: 7.1 10*3/uL (ref 4.0–10.5)

## 2013-08-01 LAB — HEPATITIS B SURFACE ANTIGEN: HEP B S AG: NEGATIVE

## 2013-08-01 LAB — RENAL FUNCTION PANEL
ANION GAP: 13 (ref 5–15)
Albumin: 2.1 g/dL — ABNORMAL LOW (ref 3.5–5.2)
BUN: 22 mg/dL (ref 6–23)
CALCIUM: 8.7 mg/dL (ref 8.4–10.5)
CHLORIDE: 102 meq/L (ref 96–112)
CO2: 26 mEq/L (ref 19–32)
Creatinine, Ser: 6.04 mg/dL — ABNORMAL HIGH (ref 0.50–1.10)
GFR calc Af Amer: 7 mL/min — ABNORMAL LOW (ref >90)
GFR, EST NON AFRICAN AMERICAN: 6 mL/min — AB (ref >90)
GLUCOSE: 99 mg/dL (ref 70–99)
POTASSIUM: 4 meq/L (ref 3.7–5.3)
Phosphorus: 3.7 mg/dL (ref 2.3–4.6)
Sodium: 141 mEq/L (ref 137–147)

## 2013-08-01 MED ORDER — HEPARIN SODIUM (PORCINE) 1000 UNIT/ML IJ SOLN
2000.0000 [IU] | Freq: Once | INTRAMUSCULAR | Status: AC
  Administered 2013-08-01: 2000 [IU] via INTRAVENOUS
  Filled 2013-08-01: qty 2

## 2013-08-01 MED ORDER — DOXERCALCIFEROL 4 MCG/2ML IV SOLN
INTRAVENOUS | Status: AC
  Administered 2013-08-01: 4 ug
  Filled 2013-08-01: qty 2

## 2013-08-01 MED ORDER — RENA-VITE PO TABS: 1.0000 | ORAL_TABLET | Freq: Every day | ORAL | Status: DC

## 2013-08-01 MED ORDER — CLONIDINE HCL 0.2 MG PO TABS: 0.2000 mg | ORAL_TABLET | Freq: Three times a day (TID) | ORAL | Status: DC

## 2013-08-01 NOTE — Care Management Note (Signed)
CARE MANAGEMENT NOTE 08/01/2013  Patient:  Jaclyn Morgan, Jaclyn Morgan   Account Number:  [id number]  Date Initiated:  08/01/2013  Documentation initiated by:  Adelbert Gaspard  Subjective/Objective Assessment:   CM following for progression and d/c planning.     Action/Plan:   Pt will d/c to SNF, CSW working with pt and daughter.   Anticipated DC Date:  08/01/2013   Anticipated DC Plan:  SKILLED NURSING FACILITY         Choice offered to / List presented to:             Status of service:   Medicare Important Message given?  YES (If response is "NO", the following Medicare IM given date fields will be blank) Date Medicare IM given:  08/01/2013 Medicare IM given by:  Deveney Bayon Date Additional Medicare IM given:   Additional Medicare IM given by:    Discharge Disposition:  [place]  Per UR Regulation:    If discussed at Long Length of Stay Meetings, dates discussed:    Comments:

## 2013-08-01 NOTE — Progress Notes (Signed)
PT Cancellation Note  Patient Details Name: Jaclyn Morgan MRN: [id number] DOB: Aug 27, 1932   Cancelled Treatment:    Reason Eval/Treat Not Completed: Patient at procedure or test/unavailable Currently in HD;  Will follow up later today as time allows;  Otherwise, will follow up for PT tomorrow;   Thank you,  Roney Marion, [place] Pager [phone] Office [phone]     Roney Marion [facility] 08/01/2013, 8:54 AM

## 2013-08-01 NOTE — Procedures (Signed)
Patient was seen on dialysis and the procedure was supervised.  BFR 400  Via PC BP is  103/48.   Patient appears to be tolerating treatment well  Jaclyn Morgan A 08/01/2013

## 2013-08-01 NOTE — Discharge Summary (Addendum)
Physician Discharge Summary  VASHON ARCH CHE:[id number] DOB: 10-May-1932 DOA: 07/25/2013  PCP: Scarlette Calico, MD  Admit date: 07/25/2013 Discharge date: 08/01/2013  Time spent: >45 minutes  Recommendations for Outpatient Follow-up:  1. BID BP checks - adjust medications as needed for HTN 2. Being discharged to [place] [facility] as daughter unable to take care of her at home  Discharge Diagnoses:  Principal Problem:   Delirium superimposed on dementia Active Problems:   Essential hypertension, benign   Unspecified hypothyroidism   ESRD (end stage renal disease) on dialysis   Weakness   Shingles rash   Urinary tract infection, site not specified   Discharge Condition: Stable  Diet recommendation: Low sodium heart healthy renal diet  Filed Weights   07/28/13 2043 07/29/13 1825 07/29/13 2230  Weight: 67 kg (147 lb 11.3 oz) 68 kg (149 lb 14.6 oz) 65.7 kg (144 lb 13.5 oz)    History of present illness:  Jaclyn Morgan is a 80 y.o. female with PMH of ESRD new start on HD about 3 wks ago, HTN, dementia, hypothyroid presenting with generalized weakness. She had missed the past 2 dialysis sessions and was quite confused on admission. She was also found to have shingles on her left anterior chest wall.  I spoke with her daughter who states that the patient has been confused ever since she started dialysis. She notes that she is more confused right after she gets dialysis.   Hospital Course:  Principal Problem:  Encephalopathy? - delirium superimposed on dementia - noted to have confusion about 1 month ago when in the hospital - After speaking with the daughter it appears that patient has worsening of delirium due to dialysis and most likely had dementia prior to starting dialysis. Of note the daughter seems to have been told this by other physicians as well.  - mental status could have been worse on admission due to missing 2 dialysis treatments - her daughter has not visited  her in days but states that over the phone today she sounds like she is at her baseline now - Vitamin B 1474 on 6/20  - RPR non-reactive  - CT of the head negative for acute CVA  -has completed treatment of UTI   Active Problems:  Urinary tract infection, site not specified  - growing E coli-sensitivities noted - switched Rocephin to Keflex- has received a 7 day course   HCAP??  - This was suspected on admission despite the fact that there have been no signs or symptoms of this- d/c'd Vanc and Cefepime on 7/15 as mentioned above- still no signs of pneumonia   Varicella Zoster rash-  - switched Acyclovir which was started in there ER to Valacyclovir- total 7 days course given- stop date 7/20 AM  -was on 125 mg of Solumedrol BID- D/c'd this as literature does not support its use in this situation   Essential hypertension, benign  - has been dialyzed adequately but BP still elevated in 140-150 range - Catapres increased from 0.2 BID to TID - cont Cardura and Labetalol   Unspecified hypothyroidism  Results for Jaclyn, Morgan (MRN [id number]) as of 08/01/2013 10:26  Ref. Range 07/29/2013 18:00 07/29/2013 18:45  TSH Latest Range: 0.350-4.500 uIU/mL  6.740 (H)  Free T4 Latest Range: 0.80-1.80 ng/dL 0.95    - not on any medications-  TSH high but free T4 normal therefore no replacement needed   ESRD (end stage renal disease) on dialysis  - per nephrology team  Weakness  - gong to rehab for therapy     Procedures:  6/25- right radial-cephalic AV Fistula under general anaesthesia by Dr Kellie Simmering  Consultations:  nephrology  Discharge Exam: Filed Vitals:   08/01/13 1000  BP: 115/54  Pulse: 58  Temp:   Resp:    General: confused, not following all commands, very calm, receiving dialysis currently- No acute respiratory distress  Lungs: will not take deep breaths when asked, decreased breath sounds  Cardiovascular: Regular rate and rhythm without murmur gallop or rub normal S1  and S2  Abdomen: Nontender, nondistended, soft, bowel sounds positive, no rebound, no ascites, no appreciable mass  Extremities: No significant cyanosis, clubbing- resolving edema bilateral lower extremities  Skin: previously had blisters on anterior chest wall starting at sternum and going to left chest wall- there are a small amount of blisters remaining but most have cleared- there is denuded skin in the area of the prior blisters- ares has remained non-tender.     Discharge Instructions You were cared for by a hospitalist during your hospital stay. If you have any questions about your discharge medications or the care you received while you were in the hospital after you are discharged, you can call the unit and asked to speak with the hospitalist on call if the hospitalist that took care of you is not available. Once you are discharged, your primary care physician will handle any further medical issues. Please note that NO REFILLS for any discharge medications will be authorized once you are discharged, as it is imperative that you return to your primary care physician (or establish a relationship with a primary care physician if you do not have one) for your aftercare needs so that they can reassess your need for medications and monitor your lab values.      Discharge Instructions   Diet - low sodium heart healthy    Complete by:  As directed   Renal diet     Increase activity slowly    Complete by:  As directed             Medication List         acetaminophen 500 MG tablet  Commonly known as:  TYLENOL  Take 1,000 mg by mouth 2 (two) times daily as needed (pain).     BAYER ASPIRIN 325 MG tablet  Generic drug:  aspirin  Take 325 mg by mouth daily.     calcitRIOL 0.25 MCG capsule  Commonly known as:  ROCALTROL  Take 0.25 mcg by mouth every Monday, Wednesday, and Friday.     cloNIDine 0.2 MG tablet  Commonly known as:  CATAPRES  Take 1 tablet (0.2 mg total) by mouth 3  (three) times daily.     doxazosin 2 MG tablet  Commonly known as:  CARDURA  Take 4 mg by mouth 2 (two) times daily.     ferrous sulfate 325 (65 FE) MG tablet  Take 650 mg by mouth daily with breakfast.     labetalol 200 MG tablet  Commonly known as:  NORMODYNE  Take 200 mg by mouth 3 (three) times daily.     multivitamin Tabs tablet  Take 1 tablet by mouth at bedtime.     multivitamin with minerals Tabs tablet  Take 1 tablet by mouth daily.     rosuvastatin 20 MG tablet  Commonly known as:  CRESTOR  Take 20 mg by mouth every evening.       Allergies  Allergen Reactions  .  Norvasc [Amlodipine Besylate] Nausea Only  . Zocor [Simvastatin] Other (See Comments)    Kidney Issues?      The results of significant diagnostics from this hospitalization (including imaging, microbiology, ancillary and laboratory) are listed below for reference.    Significant Diagnostic Studies: Ct Head Wo Contrast  07/28/2013   CLINICAL DATA:  Altered mental status  EXAM: CT HEAD WITHOUT CONTRAST  TECHNIQUE: Contiguous axial images were obtained from the base of the skull through the vertex without intravenous contrast.  COMPARISON:  07/01/2013  FINDINGS: Re- demonstrated findings of marked atrophy with sulcal prominence centralized volume loss with commensurate ex vacuo dilatation of the ventricular system. Rather extensive periventricular hypodensities compatible with microvascular ischemic disease. Given background parenchymal abnormalities, there is no CT evidence of acute large territory infarct. No intraparenchymal or extra-axial mass or hemorrhage. Unchanged size and configuration of the ventricles and basilar cisterns. No midline shift. Intracranial atherosclerosis. Limited visualization of the paranasal sinuses and mastoid air cells are normal. Regional soft tissues are normal. Post right-sided cataract surgery. No displaced calvarial fracture.  IMPRESSION: Grossly unchanged findings of atrophy  and microvascular ischemic disease without acute intracranial process.   Electronically Signed   By: Sandi Mariscal M.D.   On: 07/28/2013 18:06   Ir Fluoro Guide Cv Line Right  07/02/2013   INDICATION: 80 year old female with acute on chronic renal failure in need of urgent hemodialysis secondary to uremia. Placement of a tunneled HD catheter is warranted to facilitate urgent hemodialysis.  EXAM: TUNNELED CENTRAL VENOUS HEMODIALYSIS CATHETER PLACEMENT WITH ULTRASOUND AND FLUOROSCOPIC GUIDANCE  MEDICATIONS: 1 g vancomycin The IV antibiotic was given in an appropriate time interval prior to skin puncture.  CONTRAST:  None  ANESTHESIA/SEDATION: Versed 2 mg IV; Fentanyl 50 mcg IV  Total Moderate Sedation Time  Fifteen minutes.  FLUOROSCOPY TIME:  18 seconds  COMPLICATIONS: None immediate  PROCEDURE: Informed written consent was obtained from the patient's daughter after a discussion of the risks, benefits, and alternatives to treatment. Questions regarding the procedure were encouraged and answered. The right neck and chest were prepped with chlorhexidine in a sterile fashion, and a sterile drape was applied covering the operative field. Maximum barrier sterile technique with sterile gowns and gloves were used for the procedure. A timeout was performed prior to the initiation of the procedure.  After creating a small venotomy incision, a micropuncture kit was utilized to access the right internal jugular vein under direct, real-time ultrasound guidance after the overlying soft tissues were anesthetized with 1% lidocaine with epinephrine. Ultrasound image documentation was performed. The microwire was kinked to measure appropriate catheter length. A stiff glidewire was advanced to the level of the IVC and the micropuncture sheath was exchanged for a peel-away sheath. A Bard hemo split tunneled hemodialysis catheter measuring 19 cm from tip to cuff was tunneled in a retrograde fashion from the anterior chest wall to the  venotomy incision.  The catheter was then placed through the peel-away sheath with tips ultimately positioned within the superior aspect of the right atrium. Final catheter positioning was confirmed and documented with a spot radiographic image. The catheter aspirates and flushes normally. The catheter was flushed with appropriate volume heparin dwells.  The catheter exit site was secured with a 0-Prolene retention suture. The venotomy incision was closed with an interrupted 4-0 Vicryl, Dermabond and Steri-strips. Dressings were applied. The patient tolerated the procedure well without immediate post procedural complication.  IMPRESSION: Successful placement of 19 cm tip to cuff tunneled hemodialysis catheter  via the right internal jugular vein with tips terminating within the superior aspect of the right atrium. The catheter is ready for immediate use.  Signed,  Criselda Peaches, MD  [facility]  [facility]   Electronically Signed   By: Jacqulynn Cadet M.D.   On: 07/02/2013 12:02   Ir US Guide Vasc Access Right  07/02/2013   INDICATION: 80 year old female with acute on chronic renal failure in need of urgent hemodialysis secondary to uremia. Placement of a tunneled HD catheter is warranted to facilitate urgent hemodialysis.  EXAM: TUNNELED CENTRAL VENOUS HEMODIALYSIS CATHETER PLACEMENT WITH ULTRASOUND AND FLUOROSCOPIC GUIDANCE  MEDICATIONS: 1 g vancomycin The IV antibiotic was given in an appropriate time interval prior to skin puncture.  CONTRAST:  None  ANESTHESIA/SEDATION: Versed 2 mg IV; Fentanyl 50 mcg IV  Total Moderate Sedation Time  Fifteen minutes.  FLUOROSCOPY TIME:  18 seconds  COMPLICATIONS: None immediate  PROCEDURE: Informed written consent was obtained from the patient's daughter after a discussion of the risks, benefits, and alternatives to treatment. Questions regarding the procedure were encouraged and answered. The right neck and chest were  prepped with chlorhexidine in a sterile fashion, and a sterile drape was applied covering the operative field. Maximum barrier sterile technique with sterile gowns and gloves were used for the procedure. A timeout was performed prior to the initiation of the procedure.  After creating a small venotomy incision, a micropuncture kit was utilized to access the right internal jugular vein under direct, real-time ultrasound guidance after the overlying soft tissues were anesthetized with 1% lidocaine with epinephrine. Ultrasound image documentation was performed. The microwire was kinked to measure appropriate catheter length. A stiff glidewire was advanced to the level of the IVC and the micropuncture sheath was exchanged for a peel-away sheath. A Bard hemo split tunneled hemodialysis catheter measuring 19 cm from tip to cuff was tunneled in a retrograde fashion from the anterior chest wall to the venotomy incision.  The catheter was then placed through the peel-away sheath with tips ultimately positioned within the superior aspect of the right atrium. Final catheter positioning was confirmed and documented with a spot radiographic image. The catheter aspirates and flushes normally. The catheter was flushed with appropriate volume heparin dwells.  The catheter exit site was secured with a 0-Prolene retention suture. The venotomy incision was closed with an interrupted 4-0 Vicryl, Dermabond and Steri-strips. Dressings were applied. The patient tolerated the procedure well without immediate post procedural complication.  IMPRESSION: Successful placement of 19 cm tip to cuff tunneled hemodialysis catheter via the right internal jugular vein with tips terminating within the superior aspect of the right atrium. The catheter is ready for immediate use.  Signed,  Criselda Peaches, MD  [facility]  [facility]   Electronically Signed   By: Jacqulynn Cadet M.D.   On: 07/02/2013  12:02   Dg Chest Port 1 View  07/25/2013   CLINICAL DATA:  Weakness.  EXAM: PORTABLE CHEST - 1 VIEW  COMPARISON:  07/02/2013.  FINDINGS: Dialysis catheter in stable position. Interval removal of left IJ line. Mediastinum and hilar structures are unremarkable. Stable cardiomegaly. Mild basilar atelectasis. Mild infiltrate right lung base cannot be excluded. No significant pleural effusion or pneumothorax.  IMPRESSION: 1. Interval removal of left IJ line. Dialysis catheter in stable position. 2. Stable cardiomegaly. 3. Mild basilar atelectasis. Mild pneumonia right lung base cannot be excluded.   Electronically Signed   By: Marcello Moores  Register   On: 07/25/2013 14:29    Microbiology: Recent Results (from the past 240 hour(s))  CULTURE, BLOOD (ROUTINE X 2)     Status: None   Collection Time    07/25/13  4:10 PM      Result Value Ref Range Status   Specimen Description BLOOD RIGHT ARM   Final   Special Requests BOTTLES DRAWN AEROBIC AND ANAEROBIC 5CC   Final   Culture  Setup Time     Final   Value: 07/25/2013 19:22     Performed at [facility]   Culture     Final   Value: STAPHYLOCOCCUS SPECIES (COAGULASE NEGATIVE)     Note: THE SIGNIFICANCE OF ISOLATING THIS ORGANISM FROM A SINGLE SET OF BLOOD CULTURES WHEN MULTIPLE SETS ARE DRAWN IS UNCERTAIN. PLEASE NOTIFY THE MICROBIOLOGY DEPARTMENT WITHIN ONE WEEK IF SPECIATION AND SENSITIVITIES ARE REQUIRED.     Note: Gram Stain Report Called to,Read Back By and Verified With: [facility] RN [phone]     Performed at [facility]   Report Status 07/28/2013 FINAL   Final  CULTURE, BLOOD (ROUTINE X 2)     Status: None   Collection Time    07/25/13  4:15 PM      Result Value Ref Range Status   Specimen Description BLOOD RIGHT HAND   Final   Special Requests BOTTLES DRAWN AEROBIC ONLY 5CC   Final   Culture  Setup Time     Final   Value: 07/25/2013 19:23     Performed at [facility]   Culture     Final   Value: NO GROWTH 5 DAYS      Performed at [facility]   Report Status 07/31/2013 FINAL   Final  URINE CULTURE     Status: None   Collection Time    07/25/13  4:55 PM      Result Value Ref Range Status   Specimen Description URINE, RANDOM   Final   Special Requests NONE   Final   Culture  Setup Time     Final   Value: 07/25/2013 17:20     Performed at [place]     Final   Value: >=100,000 COLONIES/ML     Performed at [facility]   Culture     Final   Value: ESCHERICHIA COLI     Performed at [facility]   Report Status 07/28/2013 FINAL   Final   Organism ID, Bacteria ESCHERICHIA COLI   Final  MRSA PCR SCREENING     Status: None   Collection Time    07/25/13 11:42 PM      Result Value Ref Range Status   MRSA by PCR NEGATIVE  NEGATIVE Final   Comment:            The GeneXpert MRSA Assay (FDA     approved for NASAL specimens     only), is one component of a     comprehensive MRSA colonization     surveillance program. It is not     intended to diagnose MRSA     infection nor to guide or     monitor treatment for     MRSA infections.     Labs: Basic Metabolic Panel:  Recent Labs Lab 07/25/13 1610  07/26/13 0532 07/27/13 0350 07/28/13 0530 07/30/13 0508 08/01/13 0809  NA  --   --  139 134* 137 140 141  K  --   --  4.0 4.3 3.7 4.2 4.0  CL  --   --  98 92* 94* 100 102  CO2  --   --  23 25 26 28 26   GLUCOSE  --   --  110* 209* 112* 86 99  BUN  --   --  45* 23 12 7 22   CREATININE  --   < > 9.23* 5.07* 3.22* 2.48* 6.04*  CALCIUM  --   --  8.1* 8.1* 8.1* 8.2* 8.7  MG 2.1  --   --   --   --   --   --   PHOS 4.3  --   --   --   --   --  3.7  < > = values in this interval not displayed. Liver Function Tests:  Recent Labs Lab 07/25/13 1610 07/26/13 0532 07/27/13 0350 07/28/13 0530 08/01/13 0809  AST 39* 41* 42* 42*  --   ALT 11 11 12 12   --   ALKPHOS 86 75 89 84  --   BILITOT 0.3 0.3 0.2* 0.2*  --   PROT 5.7* 5.2* 6.0 6.0  --   ALBUMIN  2.1* 1.9* 2.1* 2.2* 2.1*   No results found for this basename: LIPASE, AMYLASE,  in the last 168 hours No results found for this basename: AMMONIA,  in the last 168 hours CBC:  Recent Labs Lab 07/25/13 1420 07/25/13 2019 07/26/13 0532 07/27/13 0350 07/28/13 0530 08/01/13 0809  WBC 7.6 6.0 7.5 4.5 10.8* 7.1  NEUTROABS 5.7  --  5.4 3.6 8.8*  --   HGB 8.5* 8.4* 8.2* 9.0* 9.4* 10.0*  HCT 26.9* 26.6* 25.8* 27.8* 29.5* 32.3*  MCV 83.8 85.0 84.3 82.2 84.3 87.1  PLT 223 202 227 236 242 225   Cardiac Enzymes:  Recent Labs Lab 07/25/13 1610 07/25/13 2300 07/26/13 0532 07/26/13 0755  TROPONINI <0.30 <0.30 <0.30 <0.30   BNP: BNP (last 3 results)  Recent Labs  07/01/13 2055 07/25/13 1610  PROBNP >70000.0* >70000.0*   CBG:  Recent Labs Lab 07/26/13 0754 07/27/13 0801 07/28/13 0754 07/29/13 0741 07/30/13 0847  GLUCAP 92 176* 104* 85 85       Signed:  [place]  [facility] 08/01/2013, 10:19 AM

## 2013-08-01 NOTE — Progress Notes (Signed)
Subjective:   No complaints, more oriented today. Seen on dialysis- sleepy for me  Objective Filed Vitals:   08/01/13 0830 08/01/13 0851 08/01/13 0856 08/01/13 0900  BP: 109/55 87/40 206/86 157/79  Pulse: 69 50 50 58  Temp:      TempSrc:      Resp:      Height:      Weight:      SpO2:       Physical Exam General: Alert, oriented to [facility], came here for dialysis'. No acute distress Heart: RRR Lungs: CTA, unlabored Abdomen: soft, nontender + BS Extremities: no edema Dialysis Access: Rt I-J TDC, Maturing Rt AVF + bruit- seems small  HD: MWF [place]  4h 71.5kg 450/800 2/2.5 [place] R IJ Cath (maturing RFA AVF) Heparin 2000  Aranesp 40 ug q Wed, Hect 4 ug TIW  Assessment/Plan:  1. AMS - CT head neg. RPR non-reactive. On keflex for UTI. Off narcotics/sedating medications. Possible worsening delirium post HD in setting of dementia per primary. 2. ESRD - MWF, [place]. Hd today via PC . UF goal 3500 3. Anemia - Hgb 10 improving. Cont esa  4. Secondary hyperparathyroidism - Ca/Phos controlled. Cont hectorol.  5. HTN/volume - 157/79 On po clonidine and labetalol. . Anticipate new edw ~65.5kg. May need clonidine transdermal or alternate BP agent. I agree- we will get EDW down first and go from there 6. Nutrition - alb 2.1. Renal diet. Nepro, multi vit  7. Herpes zoster- Valacyclovir stop date 7/19  8. UTI - ecoli. On keflex  9. Dispo - To [facility], possibly today if bed still available. Per SW   Shelle Iron, NP [facility] [phone] 08/01/2013,9:22 AM  LOS: 7 days   Patient seen and examined, agree with above note with above modifications. Fairly new start to HD- with FTT advancing dementia - now with zoster.  Not sure how well this will go in the long term for Jaclyn Morgan.  Possible discharge to SNF today Corliss Parish, MD 08/01/2013      Additional Objective Labs: Basic Metabolic Panel:  Recent Labs Lab 07/25/13 1610  07/28/13 0530  07/30/13 0508 08/01/13 0809  NA  --   < > 137 140 141  K  --   < > 3.7 4.2 4.0  CL  --   < > 94* 100 102  CO2  --   < > 26 28 26   GLUCOSE  --   < > 112* 86 99  BUN  --   < > 12 7 22   CREATININE  --   < > 3.22* 2.48* 6.04*  CALCIUM  --   < > 8.1* 8.2* 8.7  PHOS 4.3  --   --   --  3.7  < > = values in this interval not displayed. Liver Function Tests:  Recent Labs Lab 07/26/13 0532 07/27/13 0350 07/28/13 0530 08/01/13 0809  AST 41* 42* 42*  --   ALT 11 12 12   --   ALKPHOS 75 89 84  --   BILITOT 0.3 0.2* 0.2*  --   PROT 5.2* 6.0 6.0  --   ALBUMIN 1.9* 2.1* 2.2* 2.1*   No results found for this basename: LIPASE, AMYLASE,  in the last 168 hours CBC:  Recent Labs Lab 07/25/13 2019 07/26/13 0532 07/27/13 0350 07/28/13 0530 08/01/13 0809  WBC 6.0 7.5 4.5 10.8* 7.1  NEUTROABS  --  5.4 3.6 8.8*  --   HGB 8.4* 8.2* 9.0* 9.4* 10.0*  HCT 26.6* 25.8* 27.8* 29.5* 32.3*  MCV 85.0 84.3 82.2 84.3 87.1  PLT 202 227 236 242 225   Blood Culture    Component Value Date/Time   SDES URINE, RANDOM 07/25/2013 1655   SPECREQUEST NONE 07/25/2013 1655   CULT  Value: ESCHERICHIA COLI Performed at [facility] 07/25/2013 1655   REPTSTATUS 07/28/2013 FINAL 07/25/2013 1655    Cardiac Enzymes:  Recent Labs Lab 07/25/13 1610 07/25/13 2300 07/26/13 0532 07/26/13 0755  TROPONINI <0.30 <0.30 <0.30 <0.30   CBG:  Recent Labs Lab 07/26/13 0754 07/27/13 0801 07/28/13 0754 07/29/13 0741 07/30/13 0847  GLUCAP 92 176* 104* 85 85   Iron Studies: No results found for this basename: IRON, TIBC, TRANSFERRIN, FERRITIN,  in the last 72 hours @lablastinr3 @ Studies/Results: No results found. Medications:   . aspirin  325 mg Oral Daily  . atorvastatin  40 mg Oral q1800  . cephALEXin  500 mg Oral Q12H  . cloNIDine  0.2 mg Oral TID  . [START ON 08/03/2013] darbepoetin (ARANESP) injection - DIALYSIS  40 mcg Intravenous Q Wed-HD  . doxazosin  4 mg Oral BID  . doxercalciferol  4 mcg  Intravenous Q M,W,F-HD  . feeding supplement (NEPRO CARB STEADY)  237 mL Oral BID BM  . heparin  2,000 Units Dialysis Once in dialysis  . heparin  2,000 Units Dialysis Once in dialysis  . heparin  5,000 Units Subcutaneous 3 times per day  . labetalol  200 mg Oral TID  . multivitamin  1 tablet Oral QHS  . sodium chloride  3 mL Intravenous Q12H  . valACYclovir  500 mg Oral QHS

## 2013-08-01 NOTE — Progress Notes (Signed)
Pt prepared for d/c to SNF. IV d/c'd. Skin intact except as most recently charted. Vitals are stable. Report called to [facility] (receiving facility). Pt to be transported by ambulance service.  Jillyn Ledger, MBA, BS, RN

## 2013-08-04 ENCOUNTER — Non-Acute Institutional Stay (SKILLED_NURSING_FACILITY): Payer: Commercial Managed Care - HMO | Admitting: Internal Medicine

## 2013-08-04 DIAGNOSIS — E039 Hypothyroidism, unspecified: Secondary | ICD-10-CM

## 2013-08-04 DIAGNOSIS — I150 Renovascular hypertension: Secondary | ICD-10-CM

## 2013-08-04 DIAGNOSIS — E7800 Pure hypercholesterolemia, unspecified: Secondary | ICD-10-CM

## 2013-08-04 DIAGNOSIS — F0390 Unspecified dementia without behavioral disturbance: Secondary | ICD-10-CM

## 2013-08-06 DIAGNOSIS — F0390 Unspecified dementia without behavioral disturbance: Secondary | ICD-10-CM | POA: Insufficient documentation

## 2013-08-06 DIAGNOSIS — I150 Renovascular hypertension: Secondary | ICD-10-CM | POA: Insufficient documentation

## 2013-08-06 NOTE — Progress Notes (Signed)
HISTORY & PHYSICAL  DATE: 08/04/2013   FACILITY: [facility]  LEVEL OF CARE: SNF (31)  ALLERGIES:  Allergies  Allergen Reactions  . Norvasc [Amlodipine Besylate] Nausea Only  . Zocor [Simvastatin] Other (See Comments)    Kidney Issues?    CHIEF COMPLAINT:  Manage hypothyroidism, hypertension and dementia  HISTORY OF PRESENT ILLNESS: 80 year old African American female was hospitalized secondary to generalized weakness and delirium. After  hospitalization she is admitted to this facility for short-term rehabilitation.  HYPOTHYROIDISM: The hypothyroidism remains stable. No complications noted from the medications presently being used.  The patient denies fatigue or constipation.  Last TSH 6.74, free T4 0.95.  DEMENTIA: The dementia remaines stable and continues to function adequately in the current living environment with supervision.  The patient has had little changes in behavior. No complications noted from the medications presently being used.  HTN: Pt 's HTN remains stable.  Denies CP, sob, DOE, pedal edema, headaches, dizziness or visual disturbances.  No complications from the medications currently being used.  Last BP : 138/70.  PAST MEDICAL HISTORY :  Past Medical History  Diagnosis Date  . Anemia   . Hypertension   . Hyperlipidemia   . Chronic kidney disease   . Hypothyroidism   . End-stage renal disease (ESRD)   . Urinary incontinence, functional   . Anxiety     PAST SURGICAL HISTORY: Past Surgical History  Procedure Laterality Date  . Cataract extraction  06/07/2010  . Transthoracic echocardiogram  10/2009    EF=>55%, mild conc LVH; LA mildly dilated; mild mitral annular calcif, mild MR; mild TR, RVSP 40-63mmHg; AV mildly sclerotic; mild pulm valve regurg; aortic root sclerosis/calcif   . Nm myocar perf wall motion  10/2009    dipyridamole; mild-mod ischemia in apical anterior, basal inferolateral, mid inferoalteral region;  post-stress EF 66%; high risk   . Av fistula placement Right 07/07/2013    Procedure: ARTERIOVENOUS (AV) FISTULA CREATION ;  Surgeon: Mal Misty, MD;  Location: [facility];  Service: Vascular;  Laterality: Right;    SOCIAL HISTORY:  reports that she has never smoked. She has never used smokeless tobacco. She reports that she does not drink alcohol or use illicit drugs.  FAMILY HISTORY:  Family History  Problem Relation Age of Onset  . Hyperlipidemia Other   . Hypertension Other   . Kidney disease Other   . Diabetes Other   . Hypertension Mother   . Diabetes Daughter   . Hypertension Daughter   . Cancer Son   . Cancer Sister     CURRENT MEDICATIONS: Reviewed per MAR/see medication list  REVIEW OF SYSTEMS:  See HPI otherwise 14 point ROS is negative.  PHYSICAL EXAMINATION  VS:  See VS section  GENERAL: no acute distress, normal body habitus EYES: conjunctivae normal, sclerae normal, normal eye lids MOUTH/THROAT: lips without lesions,no lesions in the mouth,tongue is without lesions,uvula elevates in midline NECK: supple, trachea midline, no neck masses, no thyroid tenderness, no thyromegaly LYMPHATICS: no LAN in the neck, no supraclavicular LAN RESPIRATORY: breathing is even & unlabored, BS CTAB CARDIAC: Grade 2/4 systolic murmur best heard in the mitral area,no extra heart sounds, no edema GI:  ABDOMEN: abdomen soft, normal BS, no masses, no tenderness  LIVER/SPLEEN: no hepatomegaly, no splenomegaly MUSCULOSKELETAL: HEAD: normal to inspection  EXTREMITIES: LEFT UPPER EXTREMITY: full range of motion, normal strength & tone RIGHT UPPER EXTREMITY:  full range of motion, normal strength &  tone LEFT LOWER EXTREMITY:  full range of motion, normal strength & tone RIGHT LOWER EXTREMITY:  full range of motion, normal strength & tone PSYCHIATRIC: the patient is alert & oriented to person, affect & behavior appropriate  LABS/RADIOLOGY:  Labs reviewed: Basic Metabolic  Panel:  Recent Labs  07/07/13 1419  07/25/13 1610  07/28/13 0530 07/30/13 0508 08/01/13 0809  NA 144  < >  --   < > 137 140 141  K 3.2*  < >  --   < > 3.7 4.2 4.0  CL 104  < >  --   < > 94* 100 102  CO2 27  < >  --   < > 26 28 26   GLUCOSE 122*  < >  --   < > 112* 86 99  BUN 17  < >  --   < > 12 7 22   CREATININE 4.23*  < >  --   < > 3.22* 2.48* 6.04*  CALCIUM 8.3*  < >  --   < > 8.1* 8.2* 8.7  MG  --   --  2.1  --   --   --   --   PHOS 3.9  --  4.3  --   --   --  3.7  < > = values in this interval not displayed. Liver Function Tests:  Recent Labs  07/26/13 0532 07/27/13 0350 07/28/13 0530 08/01/13 0809  AST 41* 42* 42*  --   ALT 11 12 12   --   ALKPHOS 75 89 84  --   BILITOT 0.3 0.2* 0.2*  --   PROT 5.2* 6.0 6.0  --   ALBUMIN 1.9* 2.1* 2.2* 2.1*    Recent Labs  07/01/13 2055  LIPASE 29   CBC:  Recent Labs  07/26/13 0532 07/27/13 0350 07/28/13 0530 08/01/13 0809  WBC 7.5 4.5 10.8* 7.1  NEUTROABS 5.4 3.6 8.8*  --   HGB 8.2* 9.0* 9.4* 10.0*  HCT 25.8* 27.8* 29.5* 32.3*  MCV 84.3 82.2 84.3 87.1  PLT 227 236 242 225   Cardiac Enzymes:  Recent Labs  07/25/13 2300 07/26/13 0532 07/26/13 0755  TROPONINI <0.30 <0.30 <0.30   CBG:  Recent Labs  07/28/13 0754 07/29/13 0741 07/30/13 0847  GLUCAP 104* 85 85    Transthoracic Echocardiography  Patient:    Rikayla, Demmon MR #:       [id number] Study Date: 07/02/2013 Gender:     F Age:        53 Height:     160 cm Weight:     78.9 kg BSA:        1.9 m^2 Pt. Status: Room:       2C11C   ADMITTING    Arnoldo Morale, Harvette C  [place]ina April, Harvette C  REFERRING    Arnoldo Morale, Harvette C  ATTENDING    Domenic Polite  PERFORMING   [facility], Inpatient  SONOGRAPHER  [place], [place]  cc:  ------------------------------------------------------------------- LV EF: 55% -   60%  ------------------------------------------------------------------- Indications:      428.0  CHF.  ------------------------------------------------------------------- History:   PMH:   Atrial flutter.  Congestive heart failure.  ------------------------------------------------------------------- Study Conclusions  - Left ventricle: The cavity size was normal. Wall thickness was   increased in a pattern of moderate LVH. Systolic function was   normal. The estimated ejection fraction was in the range of 55%   to 60%. Wall motion was normal; there were no regional  wall   motion abnormalities. Doppler parameters are consistent with   abnormal left ventricular relaxation (grade 1 diastolic   dysfunction). Doppler parameters are consistent with high   ventricular filling pressure. - Left atrium: The atrium was mildly dilated. - Pulmonary arteries: Systolic pressure was moderately to severely   increased. PA peak pressure: 51 mm Hg (S). - Pericardium, extracardiac: A trivial pericardial effusion was   identified.  Impressions:  - Normal LV function; moderate LVH, grade 1 diastolic dysfunction.  Transthoracic echocardiography.  M-mode, complete 2D, spectral Doppler, and color Doppler.  Birthdate:  Patient birthdate: 03/27/1932.  Age:  Patient is 80 yr old.  Sex:  Gender: female. Height:  Height: 160 cm. Height: 63 in.  Weight:  Weight: 78.9 kg. Weight: 173.6 lb.  Body mass index:  BMI: 30.8 kg/m^2.  Body surface area:    BSA: 1.9 m^2.  Blood pressure:     185/91  Patient status:  Inpatient.  Study date:  Study date: 07/02/2013. Study time: 08:44 AM.  Location:  Bedside.  -------------------------------------------------------------------  ------------------------------------------------------------------- Left ventricle:  The cavity size was normal. Wall thickness was increased in a pattern of moderate LVH. Systolic function was normal. The estimated ejection fraction was in the range of 55% to 60%. Wall motion was normal; there were no regional wall motion abnormalities.  Doppler parameters are consistent with abnormal left ventricular relaxation (grade 1 diastolic dysfunction). Doppler parameters are consistent with high ventricular filling pressure.   ------------------------------------------------------------------- Aortic valve:   Trileaflet; mildly calcified leaflets. Mobility was not restricted.  Doppler:  Transvalvular velocity was within the normal range. There was no stenosis. There was no regurgitation.   ------------------------------------------------------------------- Aorta:  Moderate fibrocalcific change of the root was noted. Aortic root: The aortic root was normal in size.  ------------------------------------------------------------------- Mitral valve:   Structurally normal valve.   Mobility was not restricted.  Doppler:  Transvalvular velocity was within the normal range. There was no evidence for stenosis. There was trivial regurgitation.  ------------------------------------------------------------------- Left atrium:  The atrium was mildly dilated.  ------------------------------------------------------------------- Right ventricle:  The cavity size was normal. Systolic function was normal.  ------------------------------------------------------------------- Pulmonic valve:    Doppler:  Transvalvular velocity was within the normal range. There was no evidence for stenosis. There was trivial regurgitation.  ------------------------------------------------------------------- Tricuspid valve:   Structurally normal valve.    Doppler: Transvalvular velocity was within the normal range. There was mild regurgitation.  ------------------------------------------------------------------- Pulmonary artery:   Systolic pressure was moderately to severely increased.  ------------------------------------------------------------------- Right atrium:  The atrium was normal in  size.  ------------------------------------------------------------------- Pericardium:  A trivial pericardial effusion was identified.  ------------------------------------------------------------------- Systemic veins: Inferior vena cava: The vessel was normal in size.  ------------------------------------------------------------------- Prepared and Electronically Authenticated by  Kirk Ruths 2015-06-20T11:21:35  ------------------------------------------------------------------- Measurements   Left ventricle                        Value       Reference  LV e&', lateral                        4.17  cm/s  ---------  LV E/e&', lateral                      16.43       ---------  LV e&', medial  3.4   cm/s  ---------  LV E/e&', medial                       20.15       ---------  LV e&', average                        3.79  cm/s  ---------  LV E/e&', average                      18.1        ---------    Aorta                                 Value       Reference  Aortic root ID, ED                    30    mm    ---------    Mitral valve                          Value       Reference  Mitral E-wave peak velocity           68.5  cm/s  ---------  Mitral A-wave peak velocity           158   cm/s  ---------  Mitral deceleration time       (H)    259   ms    150 - 230  Mitral E/A ratio, peak                0.4         ---------    Pulmonary arteries                    Value       Reference  PA pressure, S, DP             (H)    51    mm Hg <=30    Tricuspid valve                       Value       Reference  Tricuspid regurg peak velocity        347   cm/s  ---------  Tricuspid peak RV-RA gradient         48    mm Hg ---------    Systemic veins                        Value       Reference  Estimated CVP                         3     mm Hg ---------    Right ventricle                       Value       Reference  RV pressure, S, DP             (H)    51     mm Hg <=30  RV s&', lateral, S  23.3  cm/s  ---------  PORTABLE CHEST - 1 VIEW   COMPARISON:  07/02/2013.   FINDINGS: Dialysis catheter in stable position. Interval removal of left IJ line. Mediastinum and hilar structures are unremarkable. Stable cardiomegaly. Mild basilar atelectasis. Mild infiltrate right lung base cannot be excluded. No significant pleural effusion or pneumothorax.   IMPRESSION: 1. Interval removal of left IJ line. Dialysis catheter in stable position. 2. Stable cardiomegaly. 3. Mild basilar atelectasis. Mild pneumonia right lung base cannot be excluded.   CT HEAD WITHOUT CONTRAST   TECHNIQUE: Contiguous axial images were obtained from the base of the skull through the vertex without intravenous contrast.   COMPARISON:  07/01/2013   FINDINGS: Re- demonstrated findings of marked atrophy with sulcal prominence centralized volume loss with commensurate ex vacuo dilatation of the ventricular system. Rather extensive periventricular hypodensities compatible with microvascular ischemic disease. Given background parenchymal abnormalities, there is no CT evidence of acute large territory infarct. No intraparenchymal or extra-axial mass or hemorrhage. Unchanged size and configuration of the ventricles and basilar cisterns. No midline shift. Intracranial atherosclerosis. Limited visualization of the paranasal sinuses and mastoid air cells are normal. Regional soft tissues are normal. Post right-sided cataract surgery. No displaced calvarial fracture.   IMPRESSION: Grossly unchanged findings of atrophy and microvascular ischemic disease without acute intracranial process.     ASSESSMENT/PLAN:  Hypothyroidism-recheck TSH in 6 weeks. Dementia-stable Renovascular hypertension-well-controlled Hyperlipidemia-continue Crestor End-stage renal disease-on hemodialysis  Check CBC and BMP  I have reviewed patient's medical records received at  admission/from hospitalization.  CPT CODE: 42706  Gayani Y Dasanayaka, [place] [phone]

## 2013-08-15 ENCOUNTER — Non-Acute Institutional Stay (SKILLED_NURSING_FACILITY): Payer: Commercial Managed Care - HMO | Admitting: Internal Medicine

## 2013-08-15 DIAGNOSIS — R7309 Other abnormal glucose: Secondary | ICD-10-CM

## 2013-08-15 DIAGNOSIS — R739 Hyperglycemia, unspecified: Secondary | ICD-10-CM

## 2013-08-15 DIAGNOSIS — N039 Chronic nephritic syndrome with unspecified morphologic changes: Secondary | ICD-10-CM

## 2013-08-15 DIAGNOSIS — D631 Anemia in chronic kidney disease: Secondary | ICD-10-CM

## 2013-08-15 DIAGNOSIS — N189 Chronic kidney disease, unspecified: Principal | ICD-10-CM

## 2013-08-16 NOTE — Progress Notes (Signed)
         PROGRESS NOTE  DATE: 08/15/2013  FACILITY:  [facility]  LEVEL OF CARE: SNF (31)  Acute Visit  CHIEF COMPLAINT:  Manage anemia of chronic kidney disease and hyperglycemia  HISTORY OF PRESENT ILLNESS: I was requested by the staff to assess the patient regarding above problem(s):  ANEMIA: The anemia is unstable. The patient denies fatigue, melena or hematochezia. No complications from the medications currently being used. On 08-09-13 hemoglobin 8.5, MCV 85. On 08-01-13 hemoglobin 10.  HYPERGLYCEMIA: New problem. On 08-09-13 glucose 120. She does not have a history of diabetes mellitus and she is not on prednisone.  PAST MEDICAL HISTORY : Reviewed.  No changes/see problem list  CURRENT MEDICATIONS: Reviewed per MAR/see medication list  REVIEW OF SYSTEMS:  GENERAL: no change in appetite, no fatigue, no weight changes, no fever, chills or weakness RESPIRATORY: no cough, SOB, DOE,, wheezing, hemoptysis CARDIAC: no chest pain, edema or palpitations GI: no abdominal pain, diarrhea, constipation, heart burn, nausea or vomiting  PHYSICAL EXAMINATION  VS: see VS section  GENERAL: no acute distress, normal body habitus EYES: conjunctivae normal, sclerae normal, normal eye lids NECK: supple, trachea midline, no neck masses, no thyroid tenderness, no thyromegaly LYMPHATICS: no LAN in the neck, no supraclavicular LAN RESPIRATORY: breathing is even & unlabored, BS CTAB CARDIAC: RRR, no murmur,no extra heart sounds, no edema GI: abdomen soft, normal BS, no masses, no tenderness, no hepatomegaly, no splenomegaly PSYCHIATRIC: the patient is alert & oriented to person, affect & behavior appropriate  LABS/RADIOLOGY: See history of present illness  ASSESSMENT/PLAN:  Anemia of chronic kidney disease-unstable problem. Hemoglobin declined. Recheck in one week. Hyperglycemia-new problem. Check fasting glucose level and hemoglobin A1c.  CPT CODE: 92119  Gayani Y  Dasanayaka, [place] [phone]

## 2013-08-18 ENCOUNTER — Non-Acute Institutional Stay (SKILLED_NURSING_FACILITY): Payer: Commercial Managed Care - HMO | Admitting: Internal Medicine

## 2013-08-18 DIAGNOSIS — E039 Hypothyroidism, unspecified: Secondary | ICD-10-CM

## 2013-08-18 DIAGNOSIS — E7800 Pure hypercholesterolemia, unspecified: Secondary | ICD-10-CM

## 2013-08-18 DIAGNOSIS — N186 End stage renal disease: Secondary | ICD-10-CM

## 2013-08-18 DIAGNOSIS — I150 Renovascular hypertension: Secondary | ICD-10-CM

## 2013-08-22 NOTE — Progress Notes (Signed)
Patient ID: Jaclyn Morgan, female   DOB: 09-29-1932, 80 y.o.   MRN: [id number]                PROGRESS NOTE  DATE: 08/18/2013             FACILITY: [facility]  LEVEL OF CARE: SNF (31)  Discharge Visit  CHIEF COMPLAINT:  Manage hypertension and hypothyroidism.    HISTORY OF PRESENT ILLNESS: I was requested by the social worker to perform face-to-face evaluation for discharge:  Patient was admitted to this facility for short-term rehabilitation after the patient's recent hospitalization.  Patient has completed SNF rehabilitation and therapy has cleared the patient for discharge.  The patient is scheduled to be discharged on 08/19/2013.     Reassessment of ongoing problem(s):  HTN: Pt 's HTN remains stable.  Denies CP, sob, DOE, pedal edema, headaches, dizziness or visual disturbances.  No complications from the medications currently being used.  Last BP :  118/58.    HYPOTHYROIDISM: The hypothyroidism remains stable. The patient is not on any medications currently.  The patient denies fatigue or constipation.  Last TSH:    Not available.     PAST MEDICAL HISTORY : Reviewed.  No changes/see problem list  CURRENT MEDICATIONS: Reviewed per MAR/see medication list  REVIEW OF SYSTEMS:  GENERAL: no change in appetite, no fatigue, no weight changes, no fever, chills or weakness RESPIRATORY: no cough, SOB, DOE, wheezing, hemoptysis CARDIAC: no chest pain, edema or palpitations GI: no abdominal pain, diarrhea, constipation, heart burn, nausea or vomiting  PHYSICAL EXAMINATION  VS:  See VS section  GENERAL: no acute distress, normal body habitus EYES: conjunctivae normal, sclerae normal, normal eye lids NECK: supple, trachea midline, no neck masses, no thyroid tenderness, no thyromegaly LYMPHATICS: no LAN in the neck, no supraclavicular LAN RESPIRATORY: breathing is even & unlabored, BS CTAB CARDIAC: RRR, no murmur,no extra heart sounds, no edema GI: abdomen  soft, normal BS, no masses, no tenderness, no hepatomegaly, no splenomegaly PSYCHIATRIC: the patient is alert & oriented to person, affect & behavior appropriate  LABS/RADIOLOGY:  On 08/09/2013:  Hemoglobin 8.5, MCV 85, otherwise CBC normal.    Glucose 120, creatinine 5.03, otherwise BMP normal.    ASSESSMENT/PLAN:  Renovascular hypertension.  Well controlled.    Hypothyroidism.  The patient is currently not on levothyroxine.  Recheck TSH in six weeks.    Hyperlipidemia.  Continue Crestor.    End-stage renal disease.  On hemodialysis.    I have filled out patient's discharge paperwork and written prescriptions.  Patient will receive home health PT, OT, RN and CNA. DME provided:  Wheelchair with cushion ([phone]).    Total discharge time: Greater than 30 minutes Discharge time involved coordination of the discharge process with social worker, nursing staff and therapy department. Medical justification for home health services/DME verified.  CPT CODE: 38937            Gayani Y Dasanayaka, [place] [phone]

## 2013-08-25 ENCOUNTER — Non-Acute Institutional Stay (SKILLED_NURSING_FACILITY): Payer: Commercial Managed Care - HMO | Admitting: Internal Medicine

## 2013-08-25 DIAGNOSIS — N189 Chronic kidney disease, unspecified: Secondary | ICD-10-CM

## 2013-08-25 DIAGNOSIS — B373 Candidiasis of vulva and vagina: Secondary | ICD-10-CM

## 2013-08-25 DIAGNOSIS — B3731 Acute candidiasis of vulva and vagina: Secondary | ICD-10-CM

## 2013-08-25 DIAGNOSIS — D631 Anemia in chronic kidney disease: Secondary | ICD-10-CM

## 2013-08-25 DIAGNOSIS — N039 Chronic nephritic syndrome with unspecified morphologic changes: Secondary | ICD-10-CM

## 2013-08-30 NOTE — Progress Notes (Signed)
Patient ID: KEA CALLAN, female   DOB: 1932/01/17, 80 y.o.   MRN: [id number]           PROGRESS NOTE  DATE: 08/25/2013          FACILITY:  [facility]  LEVEL OF CARE: SNF (31)  Acute Visit  CHIEF COMPLAINT:  Manage vaginal discharge and anemia.    HISTORY OF PRESENT ILLNESS: I was requested by the staff to assess the patient regarding above problem(s):  VAGINAL DISCHARGE:  Staff report that patient has a foul-smelling, thick vaginal discharge with cottage cheese appearance, noted for a couple of days.  Patient does admit to the discharge, but denies any suprapubic pain.      ANEMIA: The anemia has been stable. The patient denies fatigue, melena or hematochezia. No complications from the medications currently being used.   On 08/22/2013:  Hemoglobin 9.2.  In 07/2013:  Hemoglobin 8.5.    PAST MEDICAL HISTORY : Reviewed.  No changes/see problem list  CURRENT MEDICATIONS: Reviewed per MAR/see medication list  REVIEW OF SYSTEMS:  GENERAL: no change in appetite, no fatigue, no weight changes, no fever, chills or weakness RESPIRATORY: no cough, SOB, DOE,, wheezing, hemoptysis CARDIAC: no chest pain, edema or palpitations GI: no abdominal pain, diarrhea, constipation, heart burn, nausea or vomiting  PHYSICAL EXAMINATION  VS: see VS section  GENERAL: no acute distress, normal body habitus NECK: supple, trachea midline, no neck masses, no thyroid tenderness, no thyromegaly RESPIRATORY: breathing is even & unlabored, BS CTAB CARDIAC: RRR, no murmur,no extra heart sounds, no edema GI: abdomen soft, normal BS, no masses, no tenderness, no hepatomegaly, no splenomegaly PSYCHIATRIC: the patient is alert & oriented to person, affect & behavior appropriate GU: there is thick, white vaginal discharge present        ASSESSMENT/PLAN:  Candida vaginitis.  New problem.  Start fluconazole 150 mg q.d. x3 days.    Anemia of chronic kidney disease.  Hemoglobin improved.       CPT CODE: 08676          Ellyanna Holton Y Chett Taniguchi, [place] [phone]

## 2013-09-01 ENCOUNTER — Ambulatory Visit: Payer: Commercial Managed Care - HMO | Admitting: Internal Medicine

## 2013-09-05 ENCOUNTER — Encounter: Payer: Self-pay | Admitting: Vascular Surgery

## 2013-09-06 ENCOUNTER — Encounter: Payer: Commercial Managed Care - HMO | Admitting: Vascular Surgery

## 2013-09-06 ENCOUNTER — Other Ambulatory Visit (HOSPITAL_COMMUNITY): Payer: Commercial Managed Care - HMO

## 2013-09-14 ENCOUNTER — Non-Acute Institutional Stay (SKILLED_NURSING_FACILITY): Payer: Commercial Managed Care - HMO | Admitting: Internal Medicine

## 2013-09-14 DIAGNOSIS — N039 Chronic nephritic syndrome with unspecified morphologic changes: Secondary | ICD-10-CM

## 2013-09-14 DIAGNOSIS — I150 Renovascular hypertension: Secondary | ICD-10-CM

## 2013-09-14 DIAGNOSIS — N189 Chronic kidney disease, unspecified: Secondary | ICD-10-CM

## 2013-09-14 DIAGNOSIS — F0390 Unspecified dementia without behavioral disturbance: Secondary | ICD-10-CM

## 2013-09-14 DIAGNOSIS — D631 Anemia in chronic kidney disease: Secondary | ICD-10-CM

## 2013-09-14 DIAGNOSIS — E7800 Pure hypercholesterolemia, unspecified: Secondary | ICD-10-CM

## 2013-09-16 NOTE — Progress Notes (Signed)
         PROGRESS NOTE  DATE: 09/14/2013  FACILITY: Nursing Home Location: [facility]  LEVEL OF CARE: SNF (31)  Routine Visit  CHIEF COMPLAINT:  Manage hypertension, hyperlipidemia and dementia  HISTORY OF PRESENT ILLNESS: This patient's scheduled discharge last month was postponed due to insurance approval of further rehabilitation.  REASSESSMENT OF ONGOING PROBLEM(S):  HTN: Pt 's HTN remains stable.  Denies CP, sob, DOE, pedal edema, headaches, dizziness or visual disturbances.  No complications from the medications currently being used.  Last BP : 106/58.  DEMENTIA: The dementia remaines stable and continues to function adequately in the current living environment with supervision.  The patient has had little changes in behavior. No complications noted from the medications presently being used.  HYPERLIPIDEMIA: No complications from the medications presently being used. Last fasting lipid panel not available.  PAST MEDICAL HISTORY : Reviewed.  No changes/see problem list  CURRENT MEDICATIONS: Reviewed per MAR/see medication list  REVIEW OF SYSTEMS:  GENERAL: no change in appetite, no fatigue, no weight changes, no fever, chills or weakness RESPIRATORY: no cough, SOB, DOE, wheezing, hemoptysis CARDIAC: no chest pain, edema or palpitations GI: no abdominal pain, diarrhea, constipation, heart burn, nausea or vomiting  PHYSICAL EXAMINATION  VS:  See VS section  GENERAL: no acute distress, normal body habitus EYES: conjunctivae normal, sclerae normal, normal eye lids NECK: supple, trachea midline, no neck masses, no thyroid tenderness, no thyromegaly LYMPHATICS: no LAN in the neck, no supraclavicular LAN RESPIRATORY: breathing is even & unlabored, BS CTAB CARDIAC: RRR, no murmur,no extra heart sounds, no edema GI: abdomen soft, normal BS, no masses, no tenderness, no hepatomegaly, no splenomegaly PSYCHIATRIC: the patient is alert & oriented to  person, affect & behavior appropriate  LABS/RADIOLOGY:  8-15 hemoglobin A1c 5.7, hemoglobin 9.2, glucose 120, creatinine 5.03 otherwise BMP normal  ASSESSMENT/PLAN:  Renovascular hypertension-well-controlled Hyperlipidemia-check fasting lipid panel Dementia-check TSH and vitamin B12 level Anemia of chronic kidney disease-stable End stage renal disease-on hemodialysis Check liver profile  CPT CODE: 81191  Zalmen Wrightsman Y Shere Eisenhart, MD [place] [phone]

## 2013-09-26 ENCOUNTER — Encounter: Payer: Self-pay | Admitting: Vascular Surgery

## 2013-09-27 ENCOUNTER — Other Ambulatory Visit (HOSPITAL_COMMUNITY): Payer: Commercial Managed Care - HMO

## 2013-09-27 ENCOUNTER — Ambulatory Visit: Payer: Commercial Managed Care - HMO | Admitting: Vascular Surgery

## 2013-10-03 ENCOUNTER — Encounter: Payer: Self-pay | Admitting: Vascular Surgery

## 2013-10-04 ENCOUNTER — Encounter: Payer: Self-pay | Admitting: Vascular Surgery

## 2013-10-04 ENCOUNTER — Ambulatory Visit (HOSPITAL_COMMUNITY)
Admission: RE | Admit: 2013-10-04 | Discharge: 2013-10-04 | Disposition: A | Discharge status: Home / Self Care | Payer: Medicare HMO | Source: Ambulatory Visit | Attending: Vascular Surgery | Admitting: Vascular Surgery

## 2013-10-04 ENCOUNTER — Other Ambulatory Visit: Payer: Self-pay | Admitting: Vascular Surgery

## 2013-10-04 ENCOUNTER — Other Ambulatory Visit: Payer: Self-pay

## 2013-10-04 ENCOUNTER — Ambulatory Visit (INDEPENDENT_AMBULATORY_CARE_PROVIDER_SITE_OTHER): Payer: Commercial Managed Care - HMO | Admitting: Vascular Surgery

## 2013-10-04 VITALS — BP 135/70 | HR 72 | Resp 16 | Ht 60.0 in | Wt 141.0 lb

## 2013-10-04 DIAGNOSIS — Z01810 Encounter for preprocedural cardiovascular examination: Secondary | ICD-10-CM | POA: Diagnosis present

## 2013-10-04 DIAGNOSIS — N186 End stage renal disease: Secondary | ICD-10-CM | POA: Insufficient documentation

## 2013-10-04 DIAGNOSIS — Z4931 Encounter for adequacy testing for hemodialysis: Secondary | ICD-10-CM | POA: Diagnosis not present

## 2013-10-04 DIAGNOSIS — I120 Hypertensive chronic kidney disease with stage 5 chronic kidney disease or end stage renal disease: Secondary | ICD-10-CM | POA: Insufficient documentation

## 2013-10-04 NOTE — Progress Notes (Signed)
Subjective:     Patient ID: Jaclyn Morgan, female   DOB: January 27, 1932, 80 y.o.   MRN: [id number]  HPI this 80 year old female had right radial cephalic AV fistula created by me in June at 2015. She never returned for followup. The fistula was found to be thrombosed and she is evaluated today for further access. She is left-handed. She has had no further or previous access cases performed. Vein mapping has been performed on a few occasions which revealed borderline sized veins.  Past Medical History  Diagnosis Date  . Anemia   . Hypertension   . Hyperlipidemia   . Chronic kidney disease   . Hypothyroidism   . End-stage renal disease (ESRD)   . Urinary incontinence, functional   . Anxiety     History  Substance Use Topics  . Smoking status: Never Smoker   . Smokeless tobacco: Never Used  . Alcohol Use: No    Family History  Problem Relation Age of Onset  . Hyperlipidemia Other   . Hypertension Other   . Kidney disease Other   . Diabetes Other   . Hypertension Mother   . Diabetes Daughter   . Hypertension Daughter   . Cancer Son   . Cancer Sister     Allergies  Allergen Reactions  . Norvasc [Amlodipine Besylate] Nausea Only  . Zocor [Simvastatin] Other (See Comments)    Kidney Issues?    Current outpatient prescriptions:acetaminophen (TYLENOL) 500 MG tablet, Take 1,000 mg by mouth 2 (two) times daily as needed (pain)., Disp: , Rfl: ;  aspirin (BAYER ASPIRIN) 325 MG tablet, Take 325 mg by mouth daily.  , Disp: , Rfl: ;  calcitRIOL (ROCALTROL) 0.25 MCG capsule, Take 0.25 mcg by mouth every Monday, Wednesday, and Friday. , Disp: , Rfl:  cloNIDine (CATAPRES) 0.2 MG tablet, Take 1 tablet (0.2 mg total) by mouth 3 (three) times daily., Disp: , Rfl: ;  doxazosin (CARDURA) 2 MG tablet, Take 4 mg by mouth 2 (two) times daily. , Disp: , Rfl: ;  ferrous sulfate 325 (65 FE) MG tablet, Take 650 mg by mouth daily with breakfast., Disp: , Rfl: ;  labetalol (NORMODYNE) 200 MG tablet, Take  200 mg by mouth 3 (three) times daily. , Disp: , Rfl:  Multiple Vitamin (MULTIVITAMIN WITH MINERALS) TABS tablet, Take 1 tablet by mouth daily., Disp: , Rfl: ;  multivitamin (RENA-VIT) TABS tablet, Take 1 tablet by mouth at bedtime., Disp: , Rfl: 0;  rosuvastatin (CRESTOR) 20 MG tablet, Take 20 mg by mouth every evening., Disp: , Rfl:   BP 135/70  Pulse 72  Resp 16  Ht 5' (1.524 m)  Wt 141 lb (63.957 kg)  BMI 27.54 kg/m2  Body mass index is 27.54 kg/(m^2).          Review of Systems denies active chest pain, dyspnea on exertion, PND, orthopnea. Patient resides in nursing home.  is in wheelchair.    Objective:   Physical Exam BP 135/70  Pulse 72  Resp 16  Ht 5' (1.524 m)  Wt 141 lb (63.957 kg)  BMI 27.54 kg/m2  Gen.-alert and oriented x3 in no apparent distress-elderly and frail in appearance HEENT normal for age Lungs no rhonchi or wheezing Cardiovascular regular rhythm no murmurs carotid pulses 3+ palpable no bruits audible Abdomen soft nontender no palpable masses Musculoskeletal free of  major deformities Skin clear -no rashes Neurologic normal Lower extremities 3+ femoral and dorsalis pedis pulses palpable bilaterally with no edema Right upper extremity has  well healed incision and distal forearm. 2+ radial pulse palpable. Fistula with no palpable pulse or thrill.  The ordered vein mapping in the right upper extremity which reveals occlusion of the right radial cephalic AV fistula. The basilic vein connects with the forearm cephalic vein in the antecubital space. The upper arm cephalic vein is not adequate in size.       Assessment:     End-stage renal disease needs further attempt at access Appears that right basilic vein transposition may be feasible-patient is left-handed    Plan:     Plan right basilic vein transposition on Thursday, October 1

## 2013-10-19 NOTE — Progress Notes (Signed)
[place] for pre-op phone call. Spoke with Joaquim Lai (nurse for [facility]). She stated that she would fax me pt's medical history and medication list. She did not have time to do this at this time, but would send it later today. She requested that I fax her the pre-op instructions to Fax # [phone].

## 2013-10-19 NOTE — Pre-Procedure Instructions (Signed)
Jaclyn Morgan  10/19/2013   Your procedure is scheduled on:  Friday, October 21, 2013 at 10:15 AM.   Report to [facility] Entrance "A" Admitting Office at 7:45 AM.   Call this number if you have problems the morning of surgery: [phone]   Remember:   Do not eat food or drink liquids after midnight Thursday, 10/20/13.   Take these medicines the morning of surgery with A SIP OF WATER: aspirin (BAYER ASPIRIN), cloNIDine (CATAPRES), doxazosin (CARDURA), labetalol (NORMODYNE), acetaminophen (TYLENOL) - if needed.   Do not wear jewelry, make-up or nail polish.  Do not wear lotions, powders, or perfumes. You may wear deodorant.  Do not shave 48 hours prior to surgery.   Do not bring valuables to the hospital.  [facility] is not responsible                  for any belongings or valuables.               Contacts, dentures or bridgework may not be worn into surgery.  Leave suitcase in the car. After surgery it may be brought to your room.  For patients admitted to the hospital, discharge time is determined by your                treatment team.               Patients discharged the day of surgery will not be allowed to drive home.

## 2013-10-19 NOTE — Progress Notes (Signed)
Faxed instructions to Joaquim Lai at [facility] per her request

## 2013-10-20 NOTE — Progress Notes (Signed)
Anesthesia Chart Review:  Patient is a 80 year old female scheduled for right arm basilic vein transposition on 10/21/13 by Dr. Kellie Simmering. She is scheduled to be a same day work-up.  History includes ESRD s/p right Cimino AVF 06/2013 now thrombosed (dialysis via Diatek), HTN, HLD, anemia, hypothyroidism, anxiety, non-smoker. She has seen [facility]EM[facility] Dr. Zola Button for monoclonal gammopathy of undetermined significance in the past (2014). Last hospital admission 07/25/13 for delirium superimposed on dementia in the setting of shingles, UTI, and recent initiation of hemodialysis with missed treatments. She was hospitalized in 06/2013 with uremia and volume overload with elevated troponin felt to be type II (demand ischemia). Cardiology was consulted with last visit by Dr. Johnsie Cancel.  Following echo results (see below), no further work-up was recommended at that time. She was started on hemodialysis during that admission.  PCP has been Dr. Scarlette Calico, but she has been followed by Dr. Durwin Reges while at the SNF.  She has seen cardiologist Dr. Debara Pickett in the past, last visit 12/2011 for what appears more for HTN management. She is a resident at [facility].  Echo on 07/02/13: - Left ventricle: The cavity size was normal. Wall thickness was increased in a pattern of moderate LVH. Systolic function was normal. The estimated ejection fraction was in the range of 55% to 60%. Wall motion was normal; there were no regional wall motion abnormalities. Doppler parameters are consistent with abnormal left ventricular relaxation (grade 1 diastolic dysfunction). Doppler parameters are consistent with high ventricular filling pressure. - Left atrium: The atrium was mildly dilated. - Pulmonary arteries: Systolic pressure was moderately to severely increased. PA peak pressure: 51 mm Hg (S). - Pericardium, extracardiac: A trivial pericardial effusion was identified. Impressions: Normal LV function; moderate LVH, grade 1  diastolic dysfunction.  Nuclear stress test on 11/01/09 showed: High risk scan. Clinical correlation recommended.  No prior study for comparison. Abnormal Myocardial Perfusion Study. There is mildi anterior and mild to moderate inferior/inferolateral wall ischemia. The patient will follow-up with Dr. Debara Pickett.  (There is no indication that cath was ever ordered/done in [place], or in Dr. Lysbeth Penner office notes from 12/12/10 or 12/31/11.)  Her last EKG in Epic from 07/25/13 showed SR, PAC, probable LAE, left BBB.  Her QRS has widened to 172ms since her previous EKGs fro 06/2013, 12/2012, 12/2011 which showed QRS ~ 110 - 112 ms.   Last 1VCXR in Epic from 07/25/13 showed:  1. Interval removal of left IJ line. Dialysis catheter in stable position.  2. Stable cardiomegaly.  3. Mild basilar atelectasis. Mild pneumonia right lung base cannot be excluded.   She is for labs on arrival.    She has a left BBB on most recent EKG, previously with incomplete left BBB or anteroseptal infarct with LVH. She was evaluated by cardiology in 06/2013 and not further work-up recommended at that time.  She is currently on hemodialysis. Discussed with anesthesiologist Dr. Oletta Lamas who agrees with further evaluation on the day of surgery.  If no acute CV/CHF symptoms and labs are acceptable then it is anticipated that she can proceed as planned.  George Hugh [facility] Short Stay Center/Anesthesiology Phone [phone] 10/20/2013 12:01 PM

## 2013-10-21 DIAGNOSIS — D649 Anemia, unspecified: Secondary | ICD-10-CM | POA: Diagnosis not present

## 2013-10-21 DIAGNOSIS — I509 Heart failure, unspecified: Secondary | ICD-10-CM | POA: Diagnosis not present

## 2013-10-21 DIAGNOSIS — Z888 Allergy status to other drugs, medicaments and biological substances status: Secondary | ICD-10-CM | POA: Diagnosis not present

## 2013-10-21 DIAGNOSIS — Z79899 Other long term (current) drug therapy: Secondary | ICD-10-CM | POA: Diagnosis not present

## 2013-10-21 DIAGNOSIS — N186 End stage renal disease: Secondary | ICD-10-CM | POA: Diagnosis not present

## 2013-10-21 DIAGNOSIS — F419 Anxiety disorder, unspecified: Secondary | ICD-10-CM | POA: Diagnosis not present

## 2013-10-21 DIAGNOSIS — E039 Hypothyroidism, unspecified: Secondary | ICD-10-CM | POA: Diagnosis not present

## 2013-10-21 DIAGNOSIS — E785 Hyperlipidemia, unspecified: Secondary | ICD-10-CM | POA: Diagnosis not present

## 2013-10-21 DIAGNOSIS — R32 Unspecified urinary incontinence: Secondary | ICD-10-CM | POA: Diagnosis not present

## 2013-10-21 DIAGNOSIS — I120 Hypertensive chronic kidney disease with stage 5 chronic kidney disease or end stage renal disease: Secondary | ICD-10-CM | POA: Diagnosis present

## 2013-10-25 MED ORDER — DEXTROSE 5 % IV SOLN
1.5000 g | INTRAVENOUS | Status: AC
  Administered 2013-10-26: 1.5 g via INTRAVENOUS
  Filled 2013-10-25: qty 1.5

## 2013-10-25 MED ORDER — SODIUM CHLORIDE 0.9 % IV SOLN
INTRAVENOUS | Status: DC
  Administered 2013-10-26 (×2): via INTRAVENOUS

## 2013-10-25 NOTE — Progress Notes (Signed)
I spoke with patient nurse Ladona Horns and gave her the updated arrival time of 0800.

## 2013-10-26 ENCOUNTER — Encounter (HOSPITAL_COMMUNITY): Payer: Self-pay | Admitting: Surgery

## 2013-10-26 ENCOUNTER — Encounter (HOSPITAL_COMMUNITY): Payer: Medicare HMO | Admitting: Vascular Surgery

## 2013-10-26 ENCOUNTER — Ambulatory Visit (HOSPITAL_COMMUNITY): Payer: Medicare HMO | Admitting: Vascular Surgery

## 2013-10-26 ENCOUNTER — Encounter (HOSPITAL_COMMUNITY)
Admission: RE | Disposition: A | Discharge status: Home / Self Care | Payer: Self-pay | Source: Ambulatory Visit | Attending: Vascular Surgery

## 2013-10-26 ENCOUNTER — Ambulatory Visit (HOSPITAL_COMMUNITY)
Admission: RE | Admit: 2013-10-26 | Discharge: 2013-10-26 | Disposition: A | Discharge status: Home / Self Care | Payer: Medicare HMO | Source: Ambulatory Visit | Attending: Vascular Surgery | Admitting: Vascular Surgery

## 2013-10-26 DIAGNOSIS — Z992 Dependence on renal dialysis: Secondary | ICD-10-CM

## 2013-10-26 DIAGNOSIS — E039 Hypothyroidism, unspecified: Secondary | ICD-10-CM | POA: Insufficient documentation

## 2013-10-26 DIAGNOSIS — E785 Hyperlipidemia, unspecified: Secondary | ICD-10-CM | POA: Diagnosis not present

## 2013-10-26 DIAGNOSIS — D649 Anemia, unspecified: Secondary | ICD-10-CM | POA: Insufficient documentation

## 2013-10-26 DIAGNOSIS — Z79899 Other long term (current) drug therapy: Secondary | ICD-10-CM | POA: Insufficient documentation

## 2013-10-26 DIAGNOSIS — N186 End stage renal disease: Secondary | ICD-10-CM | POA: Insufficient documentation

## 2013-10-26 DIAGNOSIS — Z888 Allergy status to other drugs, medicaments and biological substances status: Secondary | ICD-10-CM | POA: Insufficient documentation

## 2013-10-26 DIAGNOSIS — R32 Unspecified urinary incontinence: Secondary | ICD-10-CM | POA: Insufficient documentation

## 2013-10-26 DIAGNOSIS — N185 Chronic kidney disease, stage 5: Secondary | ICD-10-CM

## 2013-10-26 DIAGNOSIS — I509 Heart failure, unspecified: Secondary | ICD-10-CM | POA: Insufficient documentation

## 2013-10-26 DIAGNOSIS — F419 Anxiety disorder, unspecified: Secondary | ICD-10-CM | POA: Insufficient documentation

## 2013-10-26 DIAGNOSIS — I120 Hypertensive chronic kidney disease with stage 5 chronic kidney disease or end stage renal disease: Secondary | ICD-10-CM | POA: Insufficient documentation

## 2013-10-26 HISTORY — PX: BASCILIC VEIN TRANSPOSITION: SHX5742

## 2013-10-26 HISTORY — DX: Dependence on renal dialysis: Z99.2

## 2013-10-26 LAB — POCT I-STAT 4, (NA,K, GLUC, HGB,HCT)
GLUCOSE: 96 mg/dL (ref 70–99)
HCT: 41 % (ref 36.0–46.0)
Hemoglobin: 13.9 g/dL (ref 12.0–15.0)
Potassium: 3.5 mEq/L — ABNORMAL LOW (ref 3.7–5.3)
SODIUM: 138 meq/L (ref 137–147)

## 2013-10-26 SURGERY — TRANSPOSITION, VEIN, BASILIC
Anesthesia: Choice | Site: Arm Upper | Laterality: Right

## 2013-10-26 MED ORDER — EPHEDRINE SULFATE 50 MG/ML IJ SOLN
INTRAMUSCULAR | Status: AC
  Filled 2013-10-26: qty 1

## 2013-10-26 MED ORDER — LIDOCAINE HCL (CARDIAC) 20 MG/ML IV SOLN
INTRAVENOUS | Status: AC
  Filled 2013-10-26: qty 5

## 2013-10-26 MED ORDER — SODIUM CHLORIDE 0.9 % IR SOLN
Status: DC | PRN
  Administered 2013-10-26: 10:00:00

## 2013-10-26 MED ORDER — FENTANYL CITRATE 0.05 MG/ML IJ SOLN
INTRAMUSCULAR | Status: DC | PRN
  Administered 2013-10-26: 100 ug via INTRAVENOUS
  Administered 2013-10-26: 50 ug via INTRAVENOUS

## 2013-10-26 MED ORDER — PHENYLEPHRINE HCL 10 MG/ML IJ SOLN
10.0000 mg | INTRAVENOUS | Status: DC | PRN
  Administered 2013-10-26: 10 ug/min via INTRAVENOUS

## 2013-10-26 MED ORDER — CHLORHEXIDINE GLUCONATE CLOTH 2 % EX PADS: 6.0000 | MEDICATED_PAD | Freq: Once | CUTANEOUS | Status: DC

## 2013-10-26 MED ORDER — HEPARIN SODIUM (PORCINE) 1000 UNIT/ML IJ SOLN
INTRAMUSCULAR | Status: AC
  Filled 2013-10-26: qty 1

## 2013-10-26 MED ORDER — GLYCOPYRROLATE 0.2 MG/ML IJ SOLN
INTRAMUSCULAR | Status: DC | PRN
Start: 2013-10-26 — End: 2013-10-26
  Administered 2013-10-26: .2 mg via INTRAVENOUS

## 2013-10-26 MED ORDER — 0.9 % SODIUM CHLORIDE (POUR BTL) OPTIME
TOPICAL | Status: DC | PRN
  Administered 2013-10-26: 1000 mL

## 2013-10-26 MED ORDER — FENTANYL CITRATE 0.05 MG/ML IJ SOLN
INTRAMUSCULAR | Status: AC
  Filled 2013-10-26: qty 5

## 2013-10-26 MED ORDER — PHENYLEPHRINE 40 MCG/ML (10ML) SYRINGE FOR IV PUSH (FOR BLOOD PRESSURE SUPPORT)
PREFILLED_SYRINGE | INTRAVENOUS | Status: AC
  Filled 2013-10-26: qty 10

## 2013-10-26 MED ORDER — ONDANSETRON HCL 4 MG/2ML IJ SOLN
INTRAMUSCULAR | Status: DC | PRN
  Administered 2013-10-26: 4 mg via INTRAVENOUS

## 2013-10-26 MED ORDER — PHENYLEPHRINE HCL 10 MG/ML IJ SOLN
INTRAMUSCULAR | Status: DC | PRN
  Administered 2013-10-26: 40 ug via INTRAVENOUS
  Administered 2013-10-26: 160 ug via INTRAVENOUS
  Administered 2013-10-26 (×3): 80 ug via INTRAVENOUS
  Administered 2013-10-26: 160 ug via INTRAVENOUS

## 2013-10-26 MED ORDER — LIDOCAINE HCL (CARDIAC) 20 MG/ML IV SOLN
INTRAVENOUS | Status: DC | PRN
  Administered 2013-10-26: 80 mg via INTRAVENOUS

## 2013-10-26 MED ORDER — OXYCODONE HCL 5 MG PO TABS: 5.0000 mg | ORAL_TABLET | Freq: Four times a day (QID) | ORAL | Status: DC | PRN

## 2013-10-26 MED ORDER — LIDOCAINE-EPINEPHRINE (PF) 1 %-1:200000 IJ SOLN
INTRAMUSCULAR | Status: AC
  Filled 2013-10-26: qty 10

## 2013-10-26 MED ORDER — EPHEDRINE SULFATE 50 MG/ML IJ SOLN
INTRAMUSCULAR | Status: DC | PRN
  Administered 2013-10-26 (×2): 20 mg via INTRAVENOUS
  Administered 2013-10-26: 10 mg via INTRAVENOUS

## 2013-10-26 MED ORDER — LABETALOL HCL 200 MG PO TABS
200.0000 mg | ORAL_TABLET | Freq: Once | ORAL | Status: AC
  Administered 2013-10-26: 200 mg via ORAL
  Filled 2013-10-26: qty 1

## 2013-10-26 MED ORDER — PROPOFOL 10 MG/ML IV BOLUS
INTRAVENOUS | Status: DC | PRN
  Administered 2013-10-26: 20 mg via INTRAVENOUS
  Administered 2013-10-26: 150 mg via INTRAVENOUS

## 2013-10-26 MED ORDER — LIDOCAINE-EPINEPHRINE (PF) 1 %-1:200000 IJ SOLN
INTRAMUSCULAR | Status: DC | PRN
  Administered 2013-10-26: 30 mL

## 2013-10-26 MED ORDER — PROPOFOL 10 MG/ML IV BOLUS
INTRAVENOUS | Status: AC
  Filled 2013-10-26: qty 20

## 2013-10-26 MED ORDER — SUCCINYLCHOLINE CHLORIDE 20 MG/ML IJ SOLN
INTRAMUSCULAR | Status: AC
  Filled 2013-10-26: qty 1

## 2013-10-26 MED ORDER — SODIUM CHLORIDE 0.9 % IJ SOLN
INTRAMUSCULAR | Status: AC
  Filled 2013-10-26: qty 10

## 2013-10-26 SURGICAL SUPPLY — 39 items
ADH SKN CLS APL DERMABOND .7 (GAUZE/BANDAGES/DRESSINGS) ×1
CANISTER SUCTION 2500CC (MISCELLANEOUS) ×3 IMPLANT
CLIP TI MEDIUM 24 (CLIP) ×3 IMPLANT
CLIP TI WIDE RED SMALL 24 (CLIP) ×3 IMPLANT
COVER PROBE W GEL 5X96 (DRAPES) ×2 IMPLANT
COVER SURGICAL LIGHT HANDLE (MISCELLANEOUS) ×3 IMPLANT
DERMABOND ADVANCED (GAUZE/BANDAGES/DRESSINGS) ×2
DERMABOND ADVANCED .7 DNX12 (GAUZE/BANDAGES/DRESSINGS) ×1 IMPLANT
DRAIN PENROSE 1/2X12 LTX STRL (WOUND CARE) ×2 IMPLANT
ELECT REM PT RETURN 9FT ADLT (ELECTROSURGICAL) ×3
ELECTRODE REM PT RTRN 9FT ADLT (ELECTROSURGICAL) ×1 IMPLANT
GEL ULTRASOUND 20GR AQUASONIC (MISCELLANEOUS) IMPLANT
GLOVE BIO SURGEON STRL SZ 6.5 (GLOVE) ×2 IMPLANT
GLOVE BIO SURGEONS STRL SZ 6.5 (GLOVE) ×2
GLOVE BIOGEL PI IND STRL 7.0 (GLOVE) IMPLANT
GLOVE BIOGEL PI INDICATOR 7.0 (GLOVE) ×4
GLOVE SS BIOGEL STRL SZ 7 (GLOVE) ×1 IMPLANT
GLOVE SUPERSENSE BIOGEL SZ 7 (GLOVE) ×2
GLOVE SURG SS PI 7.0 STRL IVOR (GLOVE) ×4 IMPLANT
GOWN STRL REUS W/ TWL LRG LVL3 (GOWN DISPOSABLE) ×3 IMPLANT
GOWN STRL REUS W/ TWL XL LVL3 (GOWN DISPOSABLE) IMPLANT
GOWN STRL REUS W/TWL LRG LVL3 (GOWN DISPOSABLE) ×6
GOWN STRL REUS W/TWL XL LVL3 (GOWN DISPOSABLE) ×3
KIT BASIN OR (CUSTOM PROCEDURE TRAY) ×3 IMPLANT
KIT ROOM TURNOVER OR (KITS) ×3 IMPLANT
MARKER SKIN DUAL TIP RULER LAB (MISCELLANEOUS) ×2 IMPLANT
NS IRRIG 1000ML POUR BTL (IV SOLUTION) ×3 IMPLANT
PACK CV ACCESS (CUSTOM PROCEDURE TRAY) ×3 IMPLANT
PAD ARMBOARD 7.5X6 YLW CONV (MISCELLANEOUS) ×6 IMPLANT
PROBE PENCIL 8 MHZ STRL DISP (MISCELLANEOUS) ×2 IMPLANT
SUT PROLENE 6 0 BV (SUTURE) ×7 IMPLANT
SUT SILK 2 0 SH (SUTURE) ×3 IMPLANT
SUT VIC AB 3-0 SH 27 (SUTURE) ×9
SUT VIC AB 3-0 SH 27X BRD (SUTURE) ×1 IMPLANT
SUT VIC AB 4-0 PS2 27 (SUTURE) ×2 IMPLANT
TOWEL OR 17X24 6PK STRL BLUE (TOWEL DISPOSABLE) ×3 IMPLANT
TOWEL OR 17X26 10 PK STRL BLUE (TOWEL DISPOSABLE) ×3 IMPLANT
UNDERPAD 30X30 INCONTINENT (UNDERPADS AND DIAPERS) ×3 IMPLANT
WATER STERILE IRR 1000ML POUR (IV SOLUTION) ×3 IMPLANT

## 2013-10-26 NOTE — Interval H&P Note (Signed)
History and Physical Interval Note:  10/26/2013 8:18 AM  Jaclyn Morgan  has presented today for surgery, with the diagnosis of ESRD   The various methods of treatment have been discussed with the patient and family. After consideration of risks, benefits and other options for treatment, the patient has consented to  Procedure(s): [place] (Right) as a surgical intervention .  The patient's history has been reviewed, patient examined, no change in status, stable for surgery.  I have reviewed the patient's chart and labs.  Questions were answered to the patient's satisfaction.     [name]

## 2013-10-26 NOTE — Progress Notes (Signed)
Dialysis record faxed to [facility]

## 2013-10-26 NOTE — Op Note (Signed)
OPERATIVE REPORT  Date of Surgery: 10/26/2013  Surgeon: [name], MD  Assistant: Dionicio Stall  Pre-op Diagnosis: ESRD   Post-op Diagnosis: ESRD   Procedure: Procedure(s): BASCILIC VEIN TRANSPOSITION RIGHT UPPER ARM  Anesthesia: LMA  EBL: Minimal  Complications: None  Procedure Details: The patient was taken to the operating room placed in supine position at which time satisfactory Gen.-LMA anesthesia was administered. The right upper extremity was prepped with Betadine scrub and solution draped in routine sterile manner. Using the SonoSite ultrasound the basilic vein was imaged and marked from the axilla down to the antecubital area. There is an exposed through a distal incision in the antecubital area. It was traced proximally through 3 separate incisions with branches ligated with 3 and 4-0 silk ties and divided it was in transected after ligating it distally gently dilated with heparinized saline was very satisfactory vein being at least 3-1/2 mm in size throughout. The brachial artery was exposed through the distal incision it was a normal-appearing vessel with excellent pulse. The vein was carefully tunneled from the axilla down to the distal upper arm incision to the brachial artery through a curvilinear area on the anterior aspect of arm. After this was accomplished rectal artery was occluded proximally and distally with Vesseloops opened with a 15 blade extended with Potts scissors. There was excellent inflow and backbleeding. The vein was carefully measured spatulated anastomosed end to side with 6-0 Prolene. Vessel loops were then released there was excellent pulse and thrill in the fistula the vein easily palpable beneath the skin. There was also adequate radial arterial flow distally which improved with compression of the fistula. Hemostasis was achieved. No heparin or protamine was given. The wounds were closed in layers with Vicryl in a subcuticular fashion with Dermabond  patient to recovery in stable condition   [name], MD 10/26/2013 12:45 PM

## 2013-10-26 NOTE — Discharge Instructions (Signed)
° ° °  10/26/2013 CYNDA SOULE [id number] [birth year]  Surgeon(s): Mal Misty, MD  Procedure(s): BASCILIC VEIN TRANSPOSITION RIGHT UPPER ARM  x Do not fistula graft for 12 weeks

## 2013-10-26 NOTE — Transfer of Care (Signed)
Immediate Anesthesia Transfer of Care Note  Patient: Jaclyn Morgan  Procedure(s) Performed: Procedure(s): BASCILIC VEIN TRANSPOSITION RIGHT UPPER ARM (Right)  Patient Location: PACU  Anesthesia Type:General  Level of Consciousness: awake, alert  and oriented  Airway & Oxygen Therapy: Patient Spontanous Breathing and Patient connected to nasal cannula oxygen  Post-op Assessment: Report given to PACU RN and Post -op Vital signs reviewed and stable  Post vital signs: Reviewed and stable  Complications: No apparent anesthesia complications

## 2013-10-26 NOTE — Anesthesia Postprocedure Evaluation (Signed)
Anesthesia Post Note  Patient: Jaclyn Morgan  Procedure(s) Performed: Procedure(s) (LRB): BASCILIC VEIN TRANSPOSITION RIGHT UPPER ARM (Right)  Anesthesia type: general  Patient location: PACU  Post pain: Pain level controlled  Post assessment: Patient's Cardiovascular Status Stable  Last Vitals:  Filed Vitals:   10/26/13 1407  BP: 154/75  Pulse: 76  Temp:   Resp: 16    Post vital signs: Reviewed and stable  Level of consciousness: sedated  Complications: No apparent anesthesia complications

## 2013-10-26 NOTE — Progress Notes (Signed)
Patient is alert and oriented x 4 and informed Nurse that she was not given any medications this am. Nurse tried to call [facility] facility several times but each time Nurse was placed transferred and placed on hold and no ever responded. Will order Labetalol 200 mg once and administer.

## 2013-10-26 NOTE — Anesthesia Preprocedure Evaluation (Signed)
Anesthesia Evaluation  Patient identified by MRN, date of birth, ID band Patient awake    Reviewed: Allergy & Precautions, H&P , NPO status , Patient's Chart, lab work & pertinent test results  Airway Mallampati: I TM Distance: >3 FB Neck ROM: Full    Dental   Pulmonary          Cardiovascular hypertension, Pt. on medications +CHF     Neuro/Psych    GI/Hepatic   Endo/Other    Renal/GU CRFRenal disease     Musculoskeletal   Abdominal   Peds  Hematology   Anesthesia Other Findings   Reproductive/Obstetrics                           Anesthesia Physical Anesthesia Plan  ASA: III  Anesthesia Plan: General   Post-op Pain Management:    Induction: Intravenous  Airway Management Planned: LMA  Additional Equipment:   Intra-op Plan:   Post-operative Plan: Extubation in OR  Informed Consent: I have reviewed the patients History and Physical, chart, labs and discussed the procedure including the risks, benefits and alternatives for the proposed anesthesia with the patient or authorized representative who has indicated his/her understanding and acceptance.     Plan Discussed with: CRNA and Surgeon  Anesthesia Plan Comments:         Anesthesia Quick Evaluation

## 2013-10-26 NOTE — H&P (View-Only) (Signed)
Subjective:     Patient ID: Jaclyn Morgan, female   DOB: [birth year], 80 y.o.   MRN: [id number]  HPI this 80 year old female had right radial cephalic AV fistula created by me in June at 2015. She never returned for followup. The fistula was found to be thrombosed and she is evaluated today for further access. She is left-handed. She has had no further or previous access cases performed. Vein mapping has been performed on a few occasions which revealed borderline sized veins.  Past Medical History  Diagnosis Date  . Anemia   . Hypertension   . Hyperlipidemia   . Chronic kidney disease   . Hypothyroidism   . End-stage renal disease (ESRD)   . Urinary incontinence, functional   . Anxiety     History  Substance Use Topics  . Smoking status: Never Smoker   . Smokeless tobacco: Never Used  . Alcohol Use: No    Family History  Problem Relation Age of Onset  . Hyperlipidemia Other   . Hypertension Other   . Kidney disease Other   . Diabetes Other   . Hypertension Mother   . Diabetes Daughter   . Hypertension Daughter   . Cancer Son   . Cancer Sister     Allergies  Allergen Reactions  . Norvasc [Amlodipine Besylate] Nausea Only  . Zocor [Simvastatin] Other (See Comments)    Kidney Issues?    Current outpatient prescriptions:acetaminophen (TYLENOL) 500 MG tablet, Take 1,000 mg by mouth 2 (two) times daily as needed (pain)., Disp: , Rfl: ;  aspirin (BAYER ASPIRIN) 325 MG tablet, Take 325 mg by mouth daily.  , Disp: , Rfl: ;  calcitRIOL (ROCALTROL) 0.25 MCG capsule, Take 0.25 mcg by mouth every Monday, Wednesday, and Friday. , Disp: , Rfl:  cloNIDine (CATAPRES) 0.2 MG tablet, Take 1 tablet (0.2 mg total) by mouth 3 (three) times daily., Disp: , Rfl: ;  doxazosin (CARDURA) 2 MG tablet, Take 4 mg by mouth 2 (two) times daily. , Disp: , Rfl: ;  ferrous sulfate 325 (65 FE) MG tablet, Take 650 mg by mouth daily with breakfast., Disp: , Rfl: ;  labetalol (NORMODYNE) 200 MG tablet, Take  200 mg by mouth 3 (three) times daily. , Disp: , Rfl:  Multiple Vitamin (MULTIVITAMIN WITH MINERALS) TABS tablet, Take 1 tablet by mouth daily., Disp: , Rfl: ;  multivitamin (RENA-VIT) TABS tablet, Take 1 tablet by mouth at bedtime., Disp: , Rfl: 0;  rosuvastatin (CRESTOR) 20 MG tablet, Take 20 mg by mouth every evening., Disp: , Rfl:   BP 135/70  Pulse 72  Resp 16  Ht 5' (1.524 m)  Wt 141 lb (63.957 kg)  BMI 27.54 kg/m2  Body mass index is 27.54 kg/(m^2).          Review of Systems denies active chest pain, dyspnea on exertion, PND, orthopnea. Patient resides in nursing home.  is in wheelchair.    Objective:   Physical Exam BP 135/70  Pulse 72  Resp 16  Ht 5' (1.524 m)  Wt 141 lb (63.957 kg)  BMI 27.54 kg/m2  Gen.-alert and oriented x3 in no apparent distress-elderly and frail in appearance HEENT normal for age Lungs no rhonchi or wheezing Cardiovascular regular rhythm no murmurs carotid pulses 3+ palpable no bruits audible Abdomen soft nontender no palpable masses Musculoskeletal free of  major deformities Skin clear -no rashes Neurologic normal Lower extremities 3+ femoral and dorsalis pedis pulses palpable bilaterally with no edema Right upper extremity has  well healed incision and distal forearm. 2+ radial pulse palpable. Fistula with no palpable pulse or thrill.  The ordered vein mapping in the right upper extremity which reveals occlusion of the right radial cephalic AV fistula. The basilic vein connects with the forearm cephalic vein in the antecubital space. The upper arm cephalic vein is not adequate in size.       Assessment:     End-stage renal disease needs further attempt at access Appears that right basilic vein transposition may be feasible-patient is left-handed    Plan:     Plan right basilic vein transposition on Thursday, October 1

## 2013-10-27 ENCOUNTER — Other Ambulatory Visit: Payer: Self-pay | Admitting: *Deleted

## 2013-10-27 ENCOUNTER — Telehealth: Payer: Self-pay | Admitting: Vascular Surgery

## 2013-10-27 MED ORDER — OXYCODONE HCL 5 MG PO TABS: 5.0000 mg | ORAL_TABLET | Freq: Four times a day (QID) | ORAL | Status: DC | PRN

## 2013-10-27 NOTE — Telephone Encounter (Signed)
[facility] 

## 2013-10-27 NOTE — Telephone Encounter (Addendum)
Message copied by Gena Fray on Thu Oct 27, 2013 10:15 AM ------      Message from: Peter Minium K      Created: Wed Oct 26, 2013  2:03 PM      Regarding: Schedule                   ----- Message -----         From: Gabriel Earing, PA-C         Sent: 10/26/2013  12:39 PM           To: [facility] Charge Pool            S/p right BVT 10/26/13.  F/u with Dr. Kellie Simmering in 6-8 weeks.  She does not need a duplex.            Thanks,      Samantha ------  10/27/13: spoke with pts daughter to schedule, dpm

## 2013-10-29 ENCOUNTER — Encounter (HOSPITAL_COMMUNITY): Payer: Self-pay | Admitting: Vascular Surgery

## 2013-10-31 ENCOUNTER — Non-Acute Institutional Stay (SKILLED_NURSING_FACILITY): Payer: Commercial Managed Care - HMO | Admitting: Internal Medicine

## 2013-10-31 DIAGNOSIS — D631 Anemia in chronic kidney disease: Secondary | ICD-10-CM

## 2013-10-31 DIAGNOSIS — I150 Renovascular hypertension: Secondary | ICD-10-CM

## 2013-10-31 DIAGNOSIS — E7800 Pure hypercholesterolemia, unspecified: Secondary | ICD-10-CM

## 2013-10-31 DIAGNOSIS — N189 Chronic kidney disease, unspecified: Secondary | ICD-10-CM

## 2013-10-31 DIAGNOSIS — F0390 Unspecified dementia without behavioral disturbance: Secondary | ICD-10-CM

## 2013-11-01 NOTE — Progress Notes (Signed)
         PROGRESS NOTE  DATE: 10-31-13  FACILITY: Nursing Home Location: [facility]  LEVEL OF CARE: SNF (31)  Routine Visit  CHIEF COMPLAINT:  Manage hypertension, hyperlipidemia and dementia  HISTORY OF PRESENT ILLNESS:   REASSESSMENT OF ONGOING PROBLEM(S):  HTN: Pt 's HTN remains stable.  Denies CP, sob, DOE, pedal edema, headaches, dizziness or visual disturbances.  No complications from the medications currently being used.  Last BP : 106/58, 130/62  DEMENTIA: The dementia remaines stable and continues to function adequately in the current living environment with supervision.  The patient has had little changes in behavior. No complications noted from the medications presently being used.  HYPERLIPIDEMIA: No complications from the medications presently being used. Last fasting lipid panel normal in 9-15.  PAST MEDICAL HISTORY : Reviewed.  No changes/see problem list  CURRENT MEDICATIONS: Reviewed per MAR/see medication list  REVIEW OF SYSTEMS:  GENERAL: no change in appetite, no fatigue, no weight changes, no fever, chills or weakness RESPIRATORY: no cough, SOB, DOE, wheezing, hemoptysis CARDIAC: no chest pain, edema or palpitations GI: no abdominal pain, diarrhea, constipation, heart burn, nausea or vomiting  PHYSICAL EXAMINATION  VS:  See VS section  GENERAL: no acute distress, normal body habitus EYES: conjunctivae normal, sclerae normal, normal eye lids NECK: supple, trachea midline, no neck masses, no thyroid tenderness, no thyromegaly LYMPHATICS: no LAN in the neck, no supraclavicular LAN RESPIRATORY: breathing is even & unlabored, BS CTAB CARDIAC: RRR, no murmur,no extra heart sounds, right upper extremity +2 edema, bilateral lower extremity +1 edema GI: abdomen soft, normal BS, no masses, no tenderness, no hepatomegaly, no splenomegaly PSYCHIATRIC: the patient is alert & oriented to person, affect & behavior  appropriate  LABS/RADIOLOGY:  9-15 total protein 5.5, albumin 3.1 otherwise liver profile normal, TSH 3.99, vitamin B12 level 1513 8-15 hemoglobin A1c 5.7, hemoglobin 9.2, glucose 120, creatinine 5.03 otherwise BMP normal  ASSESSMENT/PLAN:  Renovascular hypertension-well-controlled Hyperlipidemia-well controlled Dementia-stable Anemia of chronic kidney disease-stable End stage renal disease-on hemodialysis Right upper extremity edema-status post hemodialysis graft placement. She denies pain. Swelling is subsiding.  CPT CODE: 77824  Nuel Dejaynes Y Gaege Sang[place]ang, [place] [phone]

## 2013-12-01 ENCOUNTER — Other Ambulatory Visit (INDEPENDENT_AMBULATORY_CARE_PROVIDER_SITE_OTHER): Payer: Commercial Managed Care - HMO

## 2013-12-01 ENCOUNTER — Ambulatory Visit (INDEPENDENT_AMBULATORY_CARE_PROVIDER_SITE_OTHER): Payer: Commercial Managed Care - HMO | Admitting: Internal Medicine

## 2013-12-01 ENCOUNTER — Encounter: Payer: Self-pay | Admitting: Internal Medicine

## 2013-12-01 ENCOUNTER — Other Ambulatory Visit: Payer: Self-pay | Admitting: *Deleted

## 2013-12-01 ENCOUNTER — Telehealth: Payer: Self-pay | Admitting: *Deleted

## 2013-12-01 VITALS — BP 94/54 | HR 64 | Temp 98.4°F | Resp 16

## 2013-12-01 DIAGNOSIS — E7800 Pure hypercholesterolemia, unspecified: Secondary | ICD-10-CM

## 2013-12-01 DIAGNOSIS — E038 Other specified hypothyroidism: Secondary | ICD-10-CM

## 2013-12-01 DIAGNOSIS — F419 Anxiety disorder, unspecified: Secondary | ICD-10-CM

## 2013-12-01 DIAGNOSIS — I10 Essential (primary) hypertension: Secondary | ICD-10-CM

## 2013-12-01 LAB — TSH: TSH: 3.75 u[IU]/mL (ref 0.35–4.50)

## 2013-12-01 MED ORDER — CLONIDINE HCL 0.1 MG PO TABS: 0.1000 mg | ORAL_TABLET | Freq: Three times a day (TID) | ORAL | Status: DC

## 2013-12-01 MED ORDER — DIAZEPAM 5 MG PO TABS: 5.0000 mg | ORAL_TABLET | Freq: Three times a day (TID) | ORAL | Status: AC | PRN

## 2013-12-01 MED ORDER — LABETALOL HCL 200 MG PO TABS: 200.0000 mg | ORAL_TABLET | Freq: Two times a day (BID) | ORAL | Status: DC

## 2013-12-01 MED ORDER — SEVELAMER CARBONATE 800 MG PO TABS: 800.0000 mg | ORAL_TABLET | Freq: Three times a day (TID) | ORAL | Status: DC

## 2013-12-01 MED ORDER — ROSUVASTATIN CALCIUM 20 MG PO TABS: 20.0000 mg | ORAL_TABLET | Freq: Every evening | ORAL | Status: DC

## 2013-12-01 MED ORDER — RENA-VITE PO TABS: 1.0000 | ORAL_TABLET | Freq: Every day | ORAL | Status: DC

## 2013-12-01 NOTE — Patient Instructions (Signed)
Hypothyroidism The thyroid is a large gland located in the lower front of your neck. The thyroid gland helps control metabolism. Metabolism is how your body handles food. It controls metabolism with the hormone thyroxine. When this gland is underactive (hypothyroid), it produces too little hormone.  CAUSES These include:   Absence or destruction of thyroid tissue.  Goiter due to iodine deficiency.  Goiter due to medications.  Congenital defects (since birth).  Problems with the pituitary. This causes a lack of TSH (thyroid stimulating hormone). This hormone tells the thyroid to turn out more hormone. SYMPTOMS  Lethargy (feeling as though you have no energy)  Cold intolerance  Weight gain (in spite of normal food intake)  Dry skin  Coarse hair  Menstrual irregularity (if severe, may lead to infertility)  Slowing of thought processes Cardiac problems are also caused by insufficient amounts of thyroid hormone. Hypothyroidism in the newborn is cretinism, and is an extreme form. It is important that this form be treated adequately and immediately or it will lead rapidly to retarded physical and mental development. DIAGNOSIS  To prove hypothyroidism, your caregiver may do blood tests and ultrasound tests. Sometimes the signs are hidden. It may be necessary for your caregiver to watch this illness with blood tests either before or after diagnosis and treatment. TREATMENT  Low levels of thyroid hormone are increased by using synthetic thyroid hormone. This is a safe, effective treatment. It usually takes about four weeks to gain the full effects of the medication. After you have the full effect of the medication, it will generally take another four weeks for problems to leave. Your caregiver may start you on low doses. If you have had heart problems the dose may be gradually increased. It is generally not an emergency to get rapidly to normal. HOME CARE INSTRUCTIONS   Take your  medications as your caregiver suggests. Let your caregiver know of any medications you are taking or start taking. Your caregiver will help you with dosage schedules.  As your condition improves, your dosage needs may increase. It will be necessary to have continuing blood tests as suggested by your caregiver.  Report all suspected medication side effects to your caregiver. SEEK MEDICAL CARE IF: Seek medical care if you develop:  Sweating.  Tremulousness (tremors).  Anxiety.  Rapid weight loss.  Heat intolerance.  Emotional swings.  Diarrhea.  Weakness. SEEK IMMEDIATE MEDICAL CARE IF:  You develop chest pain, an irregular heart beat (palpitations), or a rapid heart beat. MAKE SURE YOU:   Understand these instructions.  Will watch your condition.  Will get help right away if you are not doing well or get worse. Document Released: 12/30/2004 Document Revised: 03/24/2011 Document Reviewed: 08/20/2007 ExitCare Patient Information 2015 [employer]. This information is not intended to replace advice given to you by your health care provider. Make sure you discuss any questions you have with your health care provider.  

## 2013-12-01 NOTE — Telephone Encounter (Signed)
Patient called back.  She is requesting refill on Crestor.  Patient uses [facility] on [address].

## 2013-12-01 NOTE — Telephone Encounter (Signed)
Refilled pt meds per Dr. Ronnald Ramp.

## 2013-12-01 NOTE — Progress Notes (Signed)
Pre visit review using our clinic review tool, if applicable. No additional management support is needed unless otherwise documented below in the visit note. 

## 2013-12-01 NOTE — Telephone Encounter (Addendum)
Patient's daughter called office requesting refills on all her medications. Called daughter and left vm requesting she call back to let us know which meds she needs filled. Wants meds sent into [facility].

## 2013-12-01 NOTE — Progress Notes (Signed)
Subjective:    Patient ID: Jaclyn Morgan, female    DOB: [birth year], 80 y.o.   MRN: [id number]  Hypertension This is a chronic problem. The current episode started more than 1 year ago. The problem has been rapidly improving since onset. The problem is controlled. Associated symptoms include anxiety. Pertinent negatives include no blurred vision, chest pain, headaches, malaise/fatigue, neck pain, orthopnea, palpitations, peripheral edema, PND, shortness of breath or sweats. Past treatments include beta blockers and alpha 1 blockers. The current treatment provides significant improvement. There are no compliance problems.  Hypertensive end-organ damage includes kidney disease. Identifiable causes of hypertension include chronic renal disease.      Review of Systems  Constitutional: Negative.  Negative for fever, chills, malaise/fatigue, diaphoresis, appetite change and fatigue.  HENT: Negative.   Eyes: Negative.  Negative for blurred vision.  Respiratory: Negative.  Negative for cough, choking, chest tightness and shortness of breath.   Cardiovascular: Negative.  Negative for chest pain, palpitations, orthopnea, leg swelling and PND.  Gastrointestinal: Negative.  Negative for nausea, vomiting, abdominal pain, diarrhea, constipation and blood in stool.  Endocrine: Negative.   Genitourinary: Negative.   Musculoskeletal: Negative.  Negative for back pain, arthralgias and neck pain.  Skin: Negative.   Allergic/Immunologic: Negative.   Neurological: Negative.  Negative for dizziness, syncope, speech difficulty, light-headedness and headaches.  Hematological: Negative.  Negative for adenopathy. Does not bruise/bleed easily.  Psychiatric/Behavioral: Positive for sleep disturbance, dysphoric mood and agitation. Negative for suicidal ideas, hallucinations, behavioral problems, confusion, self-injury and decreased concentration. The patient is nervous/anxious. The patient is not hyperactive.    She complains of severe anxiety and panic, she has previously taken valium with a good response but she tells me that she recently ran out of valium. She requests a refill on valium.       Objective:   Physical Exam  Constitutional: She is oriented to person, place, and time. She appears well-developed and well-nourished. No distress.  HENT:  Head: Normocephalic and atraumatic.  Mouth/Throat: Oropharynx is clear and moist. No oropharyngeal exudate.  Eyes: Conjunctivae are normal. Right eye exhibits no discharge. Left eye exhibits no discharge. No scleral icterus.  Neck: Normal range of motion. Neck supple. No JVD present. No tracheal deviation present. No thyromegaly present.  Cardiovascular: Normal rate, regular rhythm, S1 normal, S2 normal and intact distal pulses.  Exam reveals no gallop, no S3, no S4 and no friction rub.   Murmur heard.  Systolic murmur is present with a grade of 2/6  Pulmonary/Chest: Effort normal and breath sounds normal. No stridor. No respiratory distress. She has no wheezes. She has no rales. She exhibits no tenderness.  Abdominal: Soft. Bowel sounds are normal. She exhibits no distension and no mass. There is no tenderness. There is no rebound and no guarding.  Musculoskeletal: Normal range of motion. She exhibits no edema or tenderness.  Lymphadenopathy:    She has no cervical adenopathy.  Neurological: She is oriented to person, place, and time.  Skin: Skin is warm and dry. No rash noted. She is not diaphoretic. No erythema. No pallor.  Vitals reviewed.    Lab Results  Component Value Date   WBC 7.1 08/01/2013   HGB 13.9 10/26/2013   HCT 41.0 10/26/2013   PLT 225 08/01/2013   GLUCOSE 96 10/26/2013   CHOL 256* 07/23/2012   TRIG 159.0* 07/23/2012   HDL 47.00 07/23/2012   LDLDIRECT 171.1 07/23/2012   ALT 12 07/28/2013   AST 42* 07/28/2013  NA 138 10/26/2013   K 3.5* 10/26/2013   CL 102 08/01/2013   CREATININE 6.04* 08/01/2013   BUN 22 08/01/2013     CO2 26 08/01/2013   TSH 6.740* 07/29/2013   INR 1.05 07/02/2013       Assessment & Plan:

## 2013-12-02 ENCOUNTER — Other Ambulatory Visit: Payer: Self-pay | Admitting: *Deleted

## 2013-12-02 ENCOUNTER — Telehealth: Payer: Self-pay | Admitting: Internal Medicine

## 2013-12-02 ENCOUNTER — Encounter: Payer: Self-pay | Admitting: Internal Medicine

## 2013-12-02 DIAGNOSIS — F0390 Unspecified dementia without behavioral disturbance: Secondary | ICD-10-CM | POA: Diagnosis not present

## 2013-12-02 DIAGNOSIS — G9340 Encephalopathy, unspecified: Secondary | ICD-10-CM | POA: Diagnosis not present

## 2013-12-02 DIAGNOSIS — I69951 Hemiplegia and hemiparesis following unspecified cerebrovascular disease affecting right dominant side: Secondary | ICD-10-CM | POA: Diagnosis not present

## 2013-12-02 DIAGNOSIS — M6281 Muscle weakness (generalized): Secondary | ICD-10-CM | POA: Diagnosis not present

## 2013-12-02 LAB — LIPID PANEL
Cholesterol: 113 mg/dL (ref 0–200)
HDL: 57.6 mg/dL (ref >39.00)
LDL Cholesterol: 36 mg/dL (ref 0–99)
NONHDL: 55.4
Total CHOL/HDL Ratio: 2
Triglycerides: 97 mg/dL (ref 0.0–149.0)
VLDL: 19.4 mg/dL (ref 0.0–40.0)

## 2013-12-02 MED ORDER — ROSUVASTATIN CALCIUM 20 MG PO TABS: 20.0000 mg | ORAL_TABLET | Freq: Every evening | ORAL | Status: AC

## 2013-12-02 MED ORDER — LABETALOL HCL 200 MG PO TABS: 200.0000 mg | ORAL_TABLET | Freq: Two times a day (BID) | ORAL | Status: AC

## 2013-12-02 MED ORDER — SEVELAMER CARBONATE 800 MG PO TABS: 800.0000 mg | ORAL_TABLET | Freq: Three times a day (TID) | ORAL | Status: DC

## 2013-12-02 MED ORDER — RENA-VITE PO TABS: 1.0000 | ORAL_TABLET | Freq: Every day | ORAL | Status: AC

## 2013-12-02 MED ORDER — LABETALOL HCL 200 MG PO TABS: 200.0000 mg | ORAL_TABLET | Freq: Two times a day (BID) | ORAL | Status: DC

## 2013-12-02 MED ORDER — RENA-VITE PO TABS: 1.0000 | ORAL_TABLET | Freq: Every day | ORAL | Status: DC

## 2013-12-02 MED ORDER — CLONIDINE HCL 0.1 MG PO TABS: 0.1000 mg | ORAL_TABLET | Freq: Three times a day (TID) | ORAL | Status: AC

## 2013-12-02 MED ORDER — CLONIDINE HCL 0.1 MG PO TABS: 0.1000 mg | ORAL_TABLET | Freq: Three times a day (TID) | ORAL | Status: DC

## 2013-12-02 NOTE — Assessment & Plan Note (Signed)
Her TSH is in the normal range She is euthyroid

## 2013-12-02 NOTE — Assessment & Plan Note (Signed)
She will take valium as needed

## 2013-12-02 NOTE — Assessment & Plan Note (Signed)
Her BP is well controlled 

## 2013-12-02 NOTE — Assessment & Plan Note (Signed)
She has achieved her LDL goal 

## 2013-12-02 NOTE — Telephone Encounter (Signed)
Refills on all meds were sent to [facility]. Sent 30 day supply to [facility].

## 2013-12-02 NOTE — Telephone Encounter (Signed)
Pt daughter called in and said that [facility] does not have her meds.  She had so many question about the meds that I told her that would have to get michelle miller to call her back    Best number -[phone]

## 2013-12-02 NOTE — Telephone Encounter (Signed)
Called daughter back. Sent 30 day supply to [facility]. Patient out of all her meds.

## 2013-12-07 ENCOUNTER — Telehealth: Payer: Self-pay | Admitting: Internal Medicine

## 2013-12-07 MED ORDER — SEVELAMER CARBONATE 800 MG PO TABS: 800.0000 mg | ORAL_TABLET | Freq: Three times a day (TID) | ORAL | Status: AC

## 2013-12-07 NOTE — Telephone Encounter (Signed)
Pt daughter called in requesting Renvela 800mg  [facility]day supply to [facility]

## 2013-12-13 ENCOUNTER — Encounter: Payer: Commercial Managed Care - HMO | Admitting: Vascular Surgery

## 2013-12-31 ENCOUNTER — Other Ambulatory Visit: Payer: Self-pay | Admitting: Nurse Practitioner

## 2014-01-03 ENCOUNTER — Encounter: Payer: Commercial Managed Care - HMO | Admitting: Vascular Surgery

## 2014-01-10 ENCOUNTER — Encounter: Payer: Self-pay | Admitting: Vascular Surgery

## 2014-01-17 ENCOUNTER — Ambulatory Visit (INDEPENDENT_AMBULATORY_CARE_PROVIDER_SITE_OTHER): Payer: Self-pay | Admitting: Vascular Surgery

## 2014-01-17 ENCOUNTER — Encounter: Payer: Self-pay | Admitting: Vascular Surgery

## 2014-01-17 VITALS — BP 142/66 | HR 64 | Ht 60.0 in | Wt 139.0 lb

## 2014-01-17 DIAGNOSIS — N186 End stage renal disease: Secondary | ICD-10-CM

## 2014-01-17 NOTE — Progress Notes (Signed)
Subjective:     Patient ID: Jaclyn Morgan, female   DOB: [birth year], 81 y.o.   MRN: [id number]  HPI this 81 year old female with end-stage renal disease had basilic vein transposition-right arm-created by me on 10/26/2013. Patient has dialysis on Monday Wednesday and Friday. She denies any pain or numbness in the right hand. She is currently being dialyzed through a catheter in the right internal jugular vein.   Review of Systems     Objective:   Physical Exam BP 142/66 mmHg  Pulse 64  Ht 5' (1.524 m)  Wt 139 lb (63.05 kg)  BMI 27.15 kg/m2  SpO2 99%  Gen. elderly female in a wheelchair alert and oriented in no apparent distress Right upper extremity with excellent pulse and palpable thrill and basilic vein transposition. 1-2+ radial pulse palpable distally. No pain or numbness in right hand.     Assessment:     Nicely functioning right basilic vein transposition created 10 weeks earlier. Patient with end-stage renal disease on dialysis Monday Wednesday and Friday    Plan:     Okay to use right basilic vein transposition fistula beginning Monday, January 18.

## 2014-02-10 ENCOUNTER — Emergency Department (HOSPITAL_COMMUNITY)
Admission: EM | Admit: 2014-02-10 | Discharge: 2014-02-13 | Disposition: E | Payer: Commercial Managed Care - HMO | Attending: Emergency Medicine | Admitting: Emergency Medicine

## 2014-02-10 ENCOUNTER — Encounter (HOSPITAL_COMMUNITY): Payer: Self-pay

## 2014-02-10 ENCOUNTER — Emergency Department (HOSPITAL_COMMUNITY): Payer: Commercial Managed Care - HMO

## 2014-02-10 DIAGNOSIS — Z8659 Personal history of other mental and behavioral disorders: Secondary | ICD-10-CM | POA: Insufficient documentation

## 2014-02-10 DIAGNOSIS — R40243 Glasgow coma scale score 3-8: Secondary | ICD-10-CM

## 2014-02-10 DIAGNOSIS — Z992 Dependence on renal dialysis: Secondary | ICD-10-CM | POA: Diagnosis not present

## 2014-02-10 DIAGNOSIS — I120 Hypertensive chronic kidney disease with stage 5 chronic kidney disease or end stage renal disease: Secondary | ICD-10-CM | POA: Insufficient documentation

## 2014-02-10 DIAGNOSIS — Z862 Personal history of diseases of the blood and blood-forming organs and certain disorders involving the immune mechanism: Secondary | ICD-10-CM | POA: Diagnosis not present

## 2014-02-10 DIAGNOSIS — R4182 Altered mental status, unspecified: Secondary | ICD-10-CM | POA: Diagnosis present

## 2014-02-10 DIAGNOSIS — I469 Cardiac arrest, cause unspecified: Secondary | ICD-10-CM

## 2014-02-10 DIAGNOSIS — Z8639 Personal history of other endocrine, nutritional and metabolic disease: Secondary | ICD-10-CM | POA: Insufficient documentation

## 2014-02-10 DIAGNOSIS — I959 Hypotension, unspecified: Secondary | ICD-10-CM | POA: Insufficient documentation

## 2014-02-10 DIAGNOSIS — N186 End stage renal disease: Secondary | ICD-10-CM | POA: Insufficient documentation

## 2014-02-10 DIAGNOSIS — R092 Respiratory arrest: Secondary | ICD-10-CM

## 2014-02-10 LAB — I-STAT CHEM 8, ED
BUN: 28 mg/dL — ABNORMAL HIGH (ref 6–23)
CALCIUM ION: 0.94 mmol/L — AB (ref 1.13–1.30)
Chloride: 103 mmol/L (ref 96–112)
Creatinine, Ser: 5.9 mg/dL — ABNORMAL HIGH (ref 0.50–1.10)
GLUCOSE: 259 mg/dL — AB (ref 70–99)
HCT: 21 % — ABNORMAL LOW (ref 36.0–46.0)
HEMOGLOBIN: 7.1 g/dL — AB (ref 12.0–15.0)
Potassium: 4.9 mmol/L (ref 3.5–5.1)
Sodium: 136 mmol/L (ref 135–145)
TCO2: 18 mmol/L (ref 0–100)

## 2014-02-10 LAB — I-STAT ARTERIAL BLOOD GAS, ED
Acid-base deficit: 2 mmol/L (ref 0.0–2.0)
Bicarbonate: 22.5 meq/L (ref 20.0–24.0)
O2 Saturation: 31 %
TCO2: 24 mmol/L (ref 0–100)
pCO2 arterial: 35.6 mmHg (ref 35.0–45.0)
pH, Arterial: 7.409 (ref 7.350–7.450)
pO2, Arterial: 19 mmHg — CL (ref 80.0–100.0)

## 2014-02-10 LAB — I-STAT TROPONIN, ED: Troponin i, poc: 0.04 ng/mL (ref 0.00–0.08)

## 2014-02-10 LAB — CBG MONITORING, ED
Glucose-Capillary: 206 mg/dL — ABNORMAL HIGH (ref 70–99)
Glucose-Capillary: 158 mg/dL — ABNORMAL HIGH (ref 70–99)

## 2014-02-10 MED ORDER — NALOXONE HCL 1 MG/ML IJ SOLN
1.0000 mg | Freq: Once | INTRAMUSCULAR | Status: AC
  Administered 2014-02-10: 1 mg via INTRAVENOUS

## 2014-02-10 MED ORDER — CALCIUM CHLORIDE 10 % IV SOLN
INTRAVENOUS | Status: AC | PRN
  Administered 2014-02-10: 1 g via INTRAVENOUS

## 2014-02-10 MED ORDER — LIDOCAINE HCL (CARDIAC) 20 MG/ML IV SOLN
INTRAVENOUS | Status: AC
  Filled 2014-02-10: qty 5

## 2014-02-10 MED ORDER — ETOMIDATE 2 MG/ML IV SOLN
INTRAVENOUS | Status: AC | PRN
  Administered 2014-02-10: 20 mg via INTRAVENOUS

## 2014-02-10 MED ORDER — ATROPINE SULFATE 1 MG/ML IJ SOLN
INTRAMUSCULAR | Status: AC | PRN
  Administered 2014-02-10: 1 mg via INTRAVENOUS

## 2014-02-10 MED ORDER — EPINEPHRINE HCL 0.1 MG/ML IJ SOSY
PREFILLED_SYRINGE | INTRAMUSCULAR | Status: AC | PRN
  Administered 2014-02-10 (×3): 1 mg via INTRAVENOUS

## 2014-02-10 MED ORDER — SODIUM BICARBONATE 8.4 % IV SOLN
INTRAVENOUS | Status: AC | PRN
  Administered 2014-02-10: 50 meq via INTRAVENOUS

## 2014-02-10 MED ORDER — NALOXONE HCL 0.4 MG/ML IJ SOLN: 0.4000 mg | Freq: Once | INTRAMUSCULAR | Status: DC

## 2014-02-10 MED ORDER — ETOMIDATE 2 MG/ML IV SOLN
INTRAVENOUS | Status: AC
  Filled 2014-02-10: qty 20

## 2014-02-10 MED ORDER — ROCURONIUM BROMIDE 50 MG/5ML IV SOLN
INTRAVENOUS | Status: DC
Start: 2014-02-10 — End: 2014-02-10
  Filled 2014-02-10: qty 2

## 2014-02-10 MED ORDER — ROCURONIUM BROMIDE 50 MG/5ML IV SOLN
INTRAVENOUS | Status: AC | PRN
  Administered 2014-02-10: 70 mg via INTRAVENOUS

## 2014-02-10 MED ORDER — SUCCINYLCHOLINE CHLORIDE 20 MG/ML IJ SOLN
INTRAMUSCULAR | Status: AC
  Filled 2014-02-10: qty 1

## 2014-02-10 MED ORDER — NALOXONE HCL 1 MG/ML IJ SOLN
INTRAMUSCULAR | Status: AC
  Filled 2014-02-10: qty 2

## 2014-02-10 MED ORDER — EPINEPHRINE HCL 0.1 MG/ML IJ SOSY
PREFILLED_SYRINGE | INTRAMUSCULAR | Status: AC | PRN
  Administered 2014-02-10 (×4): 1 mg via INTRAVENOUS

## 2014-02-11 MED FILL — Medication: Qty: 1 | Status: AC

## 2014-02-13 NOTE — Consult Note (Addendum)
Referring Physician: ward    Chief Complaint: AMS possible stroke  HPI:                                                                                                                                         Jaclyn Morgan is an 81 y.o. female who was obtaining her dialysis session when she was noted to become confused. On EMS arrival patient was noted to drop her BP and was placed in trendelenburg. En route she became unresponsive and HR was noted to brady into the 30's.  On arrival to ED she was unresponsive to both pain and verbal stimuli. BP systolic in 53'G.  1 mg Narcan was administered with some response. Pateitn was intubated for airway protection.   Date last known well: Date: 02/14/14 Time last known well: Time: 13:00 tPA Given: No: stroke not suspected  Past Medical History  Diagnosis Date  . Anemia   . Hypertension   . Hyperlipidemia   . Chronic kidney disease   . Hypothyroidism   . End-stage renal disease (ESRD)   . Urinary incontinence, functional   . Anxiety   . Hemodialysis patient     M,W,F    Past Surgical History  Procedure Laterality Date  . Cataract extraction  06/07/2010  . Transthoracic echocardiogram  10/2009    EF=>55%, mild conc LVH; LA mildly dilated; mild mitral annular calcif, mild MR; mild TR, RVSP 40-49mmHg; AV mildly sclerotic; mild pulm valve regurg; aortic root sclerosis/calcif   . Nm myocar perf wall motion  10/2009    dipyridamole; mild-mod ischemia in apical anterior, basal inferolateral, mid inferoalteral region; post-stress EF 66%; high risk   . Av fistula placement Right 07/07/2013    Procedure: ARTERIOVENOUS (AV) FISTULA CREATION ;  Surgeon: Mal Misty, MD;  Location: [facility];  Service: Vascular;  Laterality: Right;  . Bascilic vein transposition Right 10/26/2013    Procedure: BASCILIC VEIN TRANSPOSITION RIGHT UPPER ARM;  Surgeon: Mal Misty, MD;  Location: [facility];  Service: Vascular;  Laterality: Right;    Family History  Problem  Relation Age of Onset  . Hyperlipidemia Other   . Hypertension Other   . Kidney disease Other   . Diabetes Other   . Hypertension Mother   . Diabetes Daughter   . Hypertension Daughter   . Cancer Son   . Cancer Sister    Social History:  reports that she has never smoked. She has never used smokeless tobacco. She reports that she does not drink alcohol or use illicit drugs.  Allergies:  Allergies  Allergen Reactions  . Norvasc [Amlodipine Besylate] Nausea Only  . Zocor [Simvastatin] Other (See Comments)    Kidney Issues?    Medications:  Current Facility-Administered Medications  Medication Dose Route Frequency Provider Last Rate Last Dose  . lidocaine (cardiac) 100 mg/43ml (XYLOCAINE) 20 MG/ML injection 2%           . succinylcholine (ANECTINE) 20 MG/ML injection            Current Outpatient Prescriptions  Medication Sig Dispense Refill  . aspirin (BAYER ASPIRIN) 325 MG tablet Take 325 mg by mouth daily.      . cloNIDine (CATAPRES) 0.1 MG tablet Take 1 tablet (0.1 mg total) by mouth 3 (three) times daily. 90 tablet 0  . diazepam (VALIUM) 5 MG tablet Take 1 tablet (5 mg total) by mouth every 8 (eight) hours as needed for anxiety. 90 tablet 3  . labetalol (NORMODYNE) 200 MG tablet Take 1 tablet (200 mg total) by mouth 2 (two) times daily. 60 tablet 0  . multivitamin (RENA-VIT) TABS tablet Take 1 tablet by mouth at bedtime. 30 tablet 0  . rosuvastatin (CRESTOR) 20 MG tablet Take 1 tablet (20 mg total) by mouth every evening. 30 tablet 0  . sevelamer carbonate (RENVELA) 800 MG tablet Take 1 tablet (800 mg total) by mouth 3 (three) times daily with meals. 270 tablet 0     ROS:                                                                                                                                       History obtained from unobtainable from patient due to  mental status   Neurologic Examination:                                                                                                      Blood pressure 82/56, pulse 103, resp. rate 10, SpO2 100 %.  HEENT-  Normocephalic, no lesions, without obvious abnormality.  Normal external eye and conjunctiva.  Normal TM's bilaterally.  Normal auditory canals and external ears. Normal external nose, mucus membranes and septum.  Normal pharynx. Cardiovascular- S1, S2 normal, pulses palpable throughout   Lungs- chest clear, no wheezing, rales, normal symmetric air entry, Heart exam - S1, S2 normal, no murmur, no gallop, rate regular Abdomen- normal findings: bowel sounds normal Extremities- no edema and positive findings: hallux valgus bilateral, callus and dry skin Lymph-no adenopathy palpable Musculoskeletal-no joint tenderness, deformity or swelling, no muscular tenderness noted Skin-warm and dry, no hyperpigmentation, vitiligo, or suspicious lesions  Neurological Examination Mental Status: Patient does not respond to verbal stimuli.  Does not respond to deep  sternal rub.  Does not follow commands.  No verbalizations noted.  Cranial Nerves: II: patient does not respond confrontation bilaterally, pupils right 2 mm, left post surgical mm,and reactive bilaterally III,IV,VI: doll's response present bilaterally.  V,VII: corneal reflex present bilaterally  VIII: patient does not respond to verbal stimuli IX,X: gag reflex present, XI: trapezius strength unable to test bilaterally XII: tongue strength unable to test Motor: Extremities flaccid throughout.  With preserved tone in UE and mild spontaneous movement Sensory: Does not respond to noxious stimuli in any extremity. Deep Tendon Reflexes:  Absent throughout. Plantars: equivocal bilaterally Cerebellar: Unable to perform       Lab Results: Basic Metabolic Panel: No results for input(s): NA, K, CL, CO2, GLUCOSE, BUN, CREATININE,  CALCIUM, MG, PHOS in the last 168 hours.  Liver Function Tests: No results for input(s): AST, ALT, ALKPHOS, BILITOT, PROT, ALBUMIN in the last 168 hours. No results for input(s): LIPASE, AMYLASE in the last 168 hours. No results for input(s): AMMONIA in the last 168 hours.  CBC: No results for input(s): WBC, NEUTROABS, HGB, HCT, MCV, PLT in the last 168 hours.  Cardiac Enzymes: No results for input(s): CKTOTAL, CKMB, CKMBINDEX, TROPONINI in the last 168 hours.  Lipid Panel: No results for input(s): CHOL, TRIG, HDL, CHOLHDL, VLDL, LDLCALC in the last 168 hours.  CBG:  Recent Labs Lab 03/01/14 1403  GLUCAP 206*    Microbiology: Results for orders placed or performed during the hospital encounter of 07/25/13  Culture, blood (routine x 2)     Status: None   Collection Time: 07/25/13  4:10 PM  Result Value Ref Range Status   Specimen Description BLOOD RIGHT ARM  Final   Special Requests BOTTLES DRAWN AEROBIC AND ANAEROBIC 5CC  Final   Culture  Setup Time   Final    07/25/2013 19:22 Performed at [facility]   Culture   Final    STAPHYLOCOCCUS SPECIES (COAGULASE NEGATIVE) Note: THE SIGNIFICANCE OF ISOLATING THIS ORGANISM FROM A SINGLE SET OF BLOOD CULTURES WHEN MULTIPLE SETS ARE DRAWN IS UNCERTAIN. PLEASE NOTIFY THE MICROBIOLOGY DEPARTMENT WITHIN ONE WEEK IF SPECIATION AND SENSITIVITIES ARE REQUIRED. Note: Gram Stain Report Called to,Read Back By and Verified With: [facility] RN [phone] Performed at [facility]   Report Status 07/28/2013 FINAL  Final  Culture, blood (routine x 2)     Status: None   Collection Time: 07/25/13  4:15 PM  Result Value Ref Range Status   Specimen Description BLOOD RIGHT HAND  Final   Special Requests BOTTLES DRAWN AEROBIC ONLY 5CC  Final   Culture  Setup Time   Final    07/25/2013 19:23 Performed at [facility]   Culture   Final    NO GROWTH 5 DAYS Performed at [facility]   Report Status 07/31/2013 FINAL   Final  Urine culture     Status: None   Collection Time: 07/25/13  4:55 PM  Result Value Ref Range Status   Specimen Description URINE, RANDOM  Final   Special Requests NONE  Final   Culture  Setup Time   Final    07/25/2013 17:20 Performed at [facility]   Final    >=100,000 COLONIES/ML Performed at [facility]   Culture   Final    ESCHERICHIA COLI Performed at [facility]   Report Status 07/28/2013 FINAL  Final   Organism ID, Bacteria ESCHERICHIA COLI  Final  Susceptibility   Escherichia coli - MIC*    AMPICILLIN >=32 RESISTANT Resistant     CEFAZOLIN <=4 SENSITIVE Sensitive     CEFTRIAXONE <=1 SENSITIVE Sensitive     CIPROFLOXACIN >=4 RESISTANT Resistant     GENTAMICIN <=1 SENSITIVE Sensitive     LEVOFLOXACIN >=8 RESISTANT Resistant     NITROFURANTOIN <=16 SENSITIVE Sensitive     TOBRAMYCIN <=1 SENSITIVE Sensitive     TRIMETH/SULFA >=320 RESISTANT Resistant     PIP/TAZO <=4 SENSITIVE Sensitive     * ESCHERICHIA COLI  MRSA PCR Screening     Status: None   Collection Time: 07/25/13 11:42 PM  Result Value Ref Range Status   MRSA by PCR NEGATIVE NEGATIVE Final    Comment:        The GeneXpert MRSA Assay (FDA approved for NASAL specimens only), is one component of a comprehensive MRSA colonization surveillance program. It is not intended to diagnose MRSA infection nor to guide or monitor treatment for MRSA infections.    Coagulation Studies: No results for input(s): LABPROT, INR in the last 72 hours.  Imaging: No results found.     Assessment and plan discussed with with attending physician and they are in agreement.    Etta Quill PA-C [facility] [phone]  02/16/14, 2:16 PM    Stroke Risk Factors - hyperlipidemia and hypertension    Assessment: 81 y.o. female with AMS in dialysis. She had no brainstem findings or abnormal posturing to suggest brainstem pathology. She was bradycardic during  transport. In the ER she had bradycardia repeatedly with ACLS performed. My suspicion is that this represents a hypoperfusion encephalopathy. EEG if patient survives.  She appeared to have some response to narcan, but wa still severely altered.   1) EEG 2) ammonia 3) cortidsol given AMS and hypotension 4) will continue to follow.   Roland Rack, MD [facility] [phone]  If 7pm- 7am, please page neurology on call as listed in [place].

## 2014-02-13 NOTE — Code Documentation (Signed)
Pt. Became bradycardic, HR 27. Pulses initially present but then lost pulses CPR started.

## 2014-02-13 NOTE — ED Notes (Signed)
No NIH completed due to intubation, CT and upon arrival back to room patient and neurologist arrival became bradycardic and CPR started

## 2014-02-13 NOTE — Progress Notes (Signed)
   03-05-14 1700  Clinical Encounter Type  Visited With Patient;Family;Patient and family together;Health care provider  Visit Type Initial;Spiritual support;Death;ED  Stress Factors  Family Stress Factors Loss   Chaplain was already in the ED when support was requested. Chaplain was notified that the patient coded and eventually passed away. Chaplain found the patient's daughter who was already on site in the ED lobby. Chaplain helped the patient's daughter to consultation A. Chaplain was present when the physician consulted with the patient's daughter. Patient's daughter wanted to visit the patient and chaplain helped her to the patient's room. Patient's daughter spent a few minutes at the patient's bedside. Patient's daughter explained that she has to be strong during this time for her family. Chaplain communicated the patient's funeral arrangements to the nurse. Chaplain helped the patient get to the main entrance of the hospital. Patient's daughter has left the hospital to continue planning the patient's funeral and to explain the death of the patient to her brother. No further family is expected to show up today. Gar Ponto, Chaplain  5:06 PM

## 2014-02-13 NOTE — ED Provider Notes (Signed)
TIME SEEN: 1:43 PM  CHIEF COMPLAINT: Altered mental status  HPI: Pt is a 81 y.o. female with history of hypertension, hyperlipidemia, end-stage renal disease on hemodialysis every Monday, Wednesday and Friday who presents emergency department with altered mental status. Per EMS, patient was receiving dialysis through a catheter in her chest when she began having right upper extremity pain and swelling. Dialysis was stopped. She did not receive her entire course of dialysis. EMS was called. On EMS arrival patient was alert, oriented. They state during transport she became increasingly confused, bradycardic into the 30s, had agonal respirations. They began to mechanically ventilate patient. Her blood glucose was 176.  ROS: Level V caveat for altered mental status  PAST MEDICAL HISTORY/PAST SURGICAL HISTORY:  Past Medical History  Diagnosis Date  . Anemia   . Hypertension   . Hyperlipidemia   . Chronic kidney disease   . Hypothyroidism   . End-stage renal disease (ESRD)   . Urinary incontinence, functional   . Anxiety   . Hemodialysis patient     M,W,F    MEDICATIONS:  Prior to Admission medications   Medication Sig Start Date End Date Taking? Authorizing Provider  aspirin (BAYER ASPIRIN) 325 MG tablet Take 325 mg by mouth daily.      Historical Provider, MD  cloNIDine (CATAPRES) 0.1 MG tablet Take 1 tablet (0.1 mg total) by mouth 3 (three) times daily. 12/02/13   Janith Lima, MD  diazepam (VALIUM) 5 MG tablet Take 1 tablet (5 mg total) by mouth every 8 (eight) hours as needed for anxiety. 12/01/13   Janith Lima, MD  labetalol (NORMODYNE) 200 MG tablet Take 1 tablet (200 mg total) by mouth 2 (two) times daily. 12/02/13   Janith Lima, MD  multivitamin (RENA-VIT) TABS tablet Take 1 tablet by mouth at bedtime. 12/02/13   Janith Lima, MD  rosuvastatin (CRESTOR) 20 MG tablet Take 1 tablet (20 mg total) by mouth every evening. 12/02/13   Janith Lima, MD  sevelamer carbonate  (RENVELA) 800 MG tablet Take 1 tablet (800 mg total) by mouth 3 (three) times daily with meals. 12/07/13   Janith Lima, MD    ALLERGIES:  Allergies  Allergen Reactions  . Norvasc [Amlodipine Besylate] Nausea Only  . Zocor [Simvastatin] Other (See Comments)    Kidney Issues?    SOCIAL HISTORY:  History  Substance Use Topics  . Smoking status: Never Smoker   . Smokeless tobacco: Never Used  . Alcohol Use: No    FAMILY HISTORY: Family History  Problem Relation Age of Onset  . Hyperlipidemia Other   . Hypertension Other   . Kidney disease Other   . Diabetes Other   . Hypertension Mother   . Diabetes Daughter   . Hypertension Daughter   . Cancer Son   . Cancer Sister     EXAM: BP 88/29 mmHg  Pulse 92  Temp(Src) 97 F (36.1 C) (Rectal)  Resp 20  Ht 5\' 6"  (1.676 m)  Wt 155 lb (70.308 kg)  BMI 25.03 kg/m2  SpO2 99% CONSTITUTIONAL: Patient awake but not following commands, only moaning, will intermittently spontaneously move her extremities HEAD: Normocephalic EYES: Conjunctivae clear, PERRL ENT: normal nose; no rhinorrhea; moist mucous membranes; pharynx without lesions noted NECK: Supple, no meningismus, no LAD  CARD: RRR; S1 and S2 appreciated; no murmurs, no clicks, no rubs, no gallops RESP: Normal chest excursion without splinting or tachypnea; breath sounds clear and equal bilaterally; no wheezes, no rhonchi, no  rales; patient has a dialysis catheter in her right chest wall without surrounding fluctuance or erythema or warmth or drainage ABD/GI: Normal bowel sounds; non-distended; soft, non-tender, no rebound, no guarding RECTAL:  No hemorrhaging BACK:  The back appears normal and is non-tender to palpation, there is no CVA tenderness EXT: Normal ROM in all joints; non-tender to palpation; no edema; normal capillary refill; no cyanosis; 2+ radial and DP pulses bilaterally, extremities are well-perfused, patient does have swelling to the right upper extremity but  her compartments are soft and no swelling in the forearm, no joint effusion, no erythema or warmth    SKIN: Normal color for age and race; warm, patient has a previous dialysis fistula in the right upper extremity NEURO: Moves all extremities equally at times spontaneously but does not follow commands or answer questions, will keep her eyes closed. Does not open eyes spontaneously.   MEDICAL DECISION MAKING: Patient here with episode of right arm pain that started while at dialysis with some associated swelling. She is receiving her dialysis currently through a catheter in her chest. While in transfer with EMS she became altered, tachypneic, bradycardic. Upon arrival patient is being bagged by EMS. She did become briefly more alert, moving all extremities, moaning but not following commands. This quickly changed again to where patient had a GCS between 4-5. Patient had no improvement after Narcan in the ED. Blood glucose in ED 206. Given her altered mental status and concern that she will not protect her airway, decision was made to intubate the patient prior to head CT. Intubation was performed using a glide scope and a 7.5 endotracheal tube. She was given rocuronium and etomidate. Tolerated procedure well. Code stroke was called upon patient's arrival to the emergency department and Dr. Leonel Ramsay was at bedside. Head CT showed no acute intracranial abnormality, no hemorrhage. Labs appeared to be unremarkable with normal bicarbonate, potassium, CO2, pH.  She didn't have a hemoglobin of 7.1 but no active hemorrhage. Likely secondary from end-stage renal disease. She initially had some hypotension that received 1 L of IV fluids from dialysis. This has improved. There does not appear to be any increased swelling or change in the right upper extremity.   I was called to bedside because patient became bradycardic. She was given one of atropine but appeared to lose her pulse. She was given multiple rounds of  epinephrine and had return of spontaneous circulation approximately 10 minutes later. EKG obtained that did not show any ischemic changes. Discussed with critical care who will see the patient in the emergency department. Second liter of IV fluids started in the emergency department for mild hypotension. Labs still pending.     Called to the bedside again because patient again bradycardic. Given another milligram of atropine. She again lost her pulse and was given multiple rounds of epinephrine, bicarbonate, calcium without any improvement. She did have 2 episodes of ventricular fibrillation and was defibrillated with 200 J. She never had return of spontaneous circulation. Critical care arrived at bedside. Decision was made to stop resuscitation. Time of death called at 3:41 PM. Discussed with Wynona Canes, medical examiner who states patient would not be a medical examiner case. Updated patient's daughter answer questions, offered support. Kaplan at bedside. Discussed with Dr. Scarlette Calico with [facility] who is patient's primary care physician who agrees to fill out the death certificate (he last saw pt Nov 2015).        EKG Interpretation  Date/Time:  19-Feb-2014  13:54:28 EST Ventricular Rate:  91 PR Interval:  153 QRS Duration: 105 QT Interval:  424 QTC Calculation: 522 R Axis:   -49 Text Interpretation:  Sinus rhythm Multiple ventricular premature complexes Incomplete RBBB and LAFB Abnormal R-wave progression, late transition LVH with secondary repolarization abnormality Prolonged QT interval No significant change since last tracing July 2015 Confirmed by [place],  DO, KRISTEN [phone]) on 03/12/2014 4:47:42 PM         EKG Interpretation  Date/Time:  Mar 12, 2014 15:04:48 EST Ventricular Rate:  116 PR Interval:  153 QRS Duration: 130 QT Interval:  406 QTC Calculation: 564 R Axis:   -53 Text Interpretation:  Atrial fibrillation RBBB and LAFB LVH with secondary  repolarization abnormality Baseline wander in lead(s) V3 V5 Confirmed by WARD,  DO, KRISTEN ([id number]) on 03/12/2014 4:48:30 PM              CRITICAL CARE Performed by: Nyra Jabs   Total critical care time: 60 minutes  Critical care time was exclusive of separately billable procedures and treating other patients.  Critical care was necessary to treat or prevent imminent or life-threatening deterioration.  Critical care was time spent personally by me on the following activities: development of treatment plan with patient and/or surrogate as well as nursing, discussions with consultants, evaluation of patient's response to treatment, examination of patient, obtaining history from patient or surrogate, ordering and performing treatments and interventions, ordering and review of laboratory studies, ordering and review of radiographic studies, pulse oximetry and re-evaluation of patient's condition.     INTUBATION Performed by: Nyra Jabs  Required items: required blood products, implants, devices, and special equipment available Patient identity confirmed: provided demographic data and hospital-assigned identification number Time out: Immediately prior to procedure a "time out" was called to verify the correct patient, procedure, equipment, support staff and site/side marked as required.  Indications: Altered mental status, airway protection   Intubation method: Glidescope Laryngoscopy   Preoxygenation: BVM  Sedatives: 20 mg IV Etomidate Paralytic: 70 mg IV rocuronium   Tube Size: 7.5 cuffed  Post-procedure assessment: chest rise and ETCO2 monitor Breath sounds: equal and absent over the epigastrium Tube secured with: ETT holder  Patient tolerated the procedure well with no immediate complications.     Wiggins, DO Mar 12, 2014 1807

## 2014-02-13 NOTE — Code Documentation (Signed)
Dr. Leonides Schanz to ultrasound of heart - minimal cardiac activity. No pulses present

## 2014-02-13 NOTE — ED Notes (Signed)
Pt. Transported to CT with RN.

## 2014-02-13 NOTE — ED Notes (Signed)
Per EMS, patient was at dialysis when she c/o right arm pain, sat patient up and became confused. When moved to EMS stretcher, patient became unresponsive, breathing 34, HR 30-40. Bagging patient on arrival. CBG 173. Patient received 1 hour of 3 hour dialysis treatment

## 2014-02-13 NOTE — ED Notes (Signed)
Pt. Returned from CT.

## 2014-02-13 NOTE — ED Notes (Signed)
Dr. Leonel Ramsay at bedside. Patient began to become bradycardic, HR of 27. No pulses present, CPR started

## 2014-02-13 NOTE — Sedation Documentation (Signed)
Dr. Leonides Schanz intubating at this time.

## 2014-02-13 DEATH — deceased

## 2014-08-19 ENCOUNTER — Encounter (HOSPITAL_COMMUNITY): Payer: Self-pay | Admitting: Anesthesiology

## 2015-12-09 IMAGING — CR DG CHEST 2V
2 series · 2 of 2 positions shown · non-contrast
Comparison: Radiograph 12/28/2012

CLINICAL DATA: Fall

EXAM:
CHEST  2 VIEW

[w chest lat]
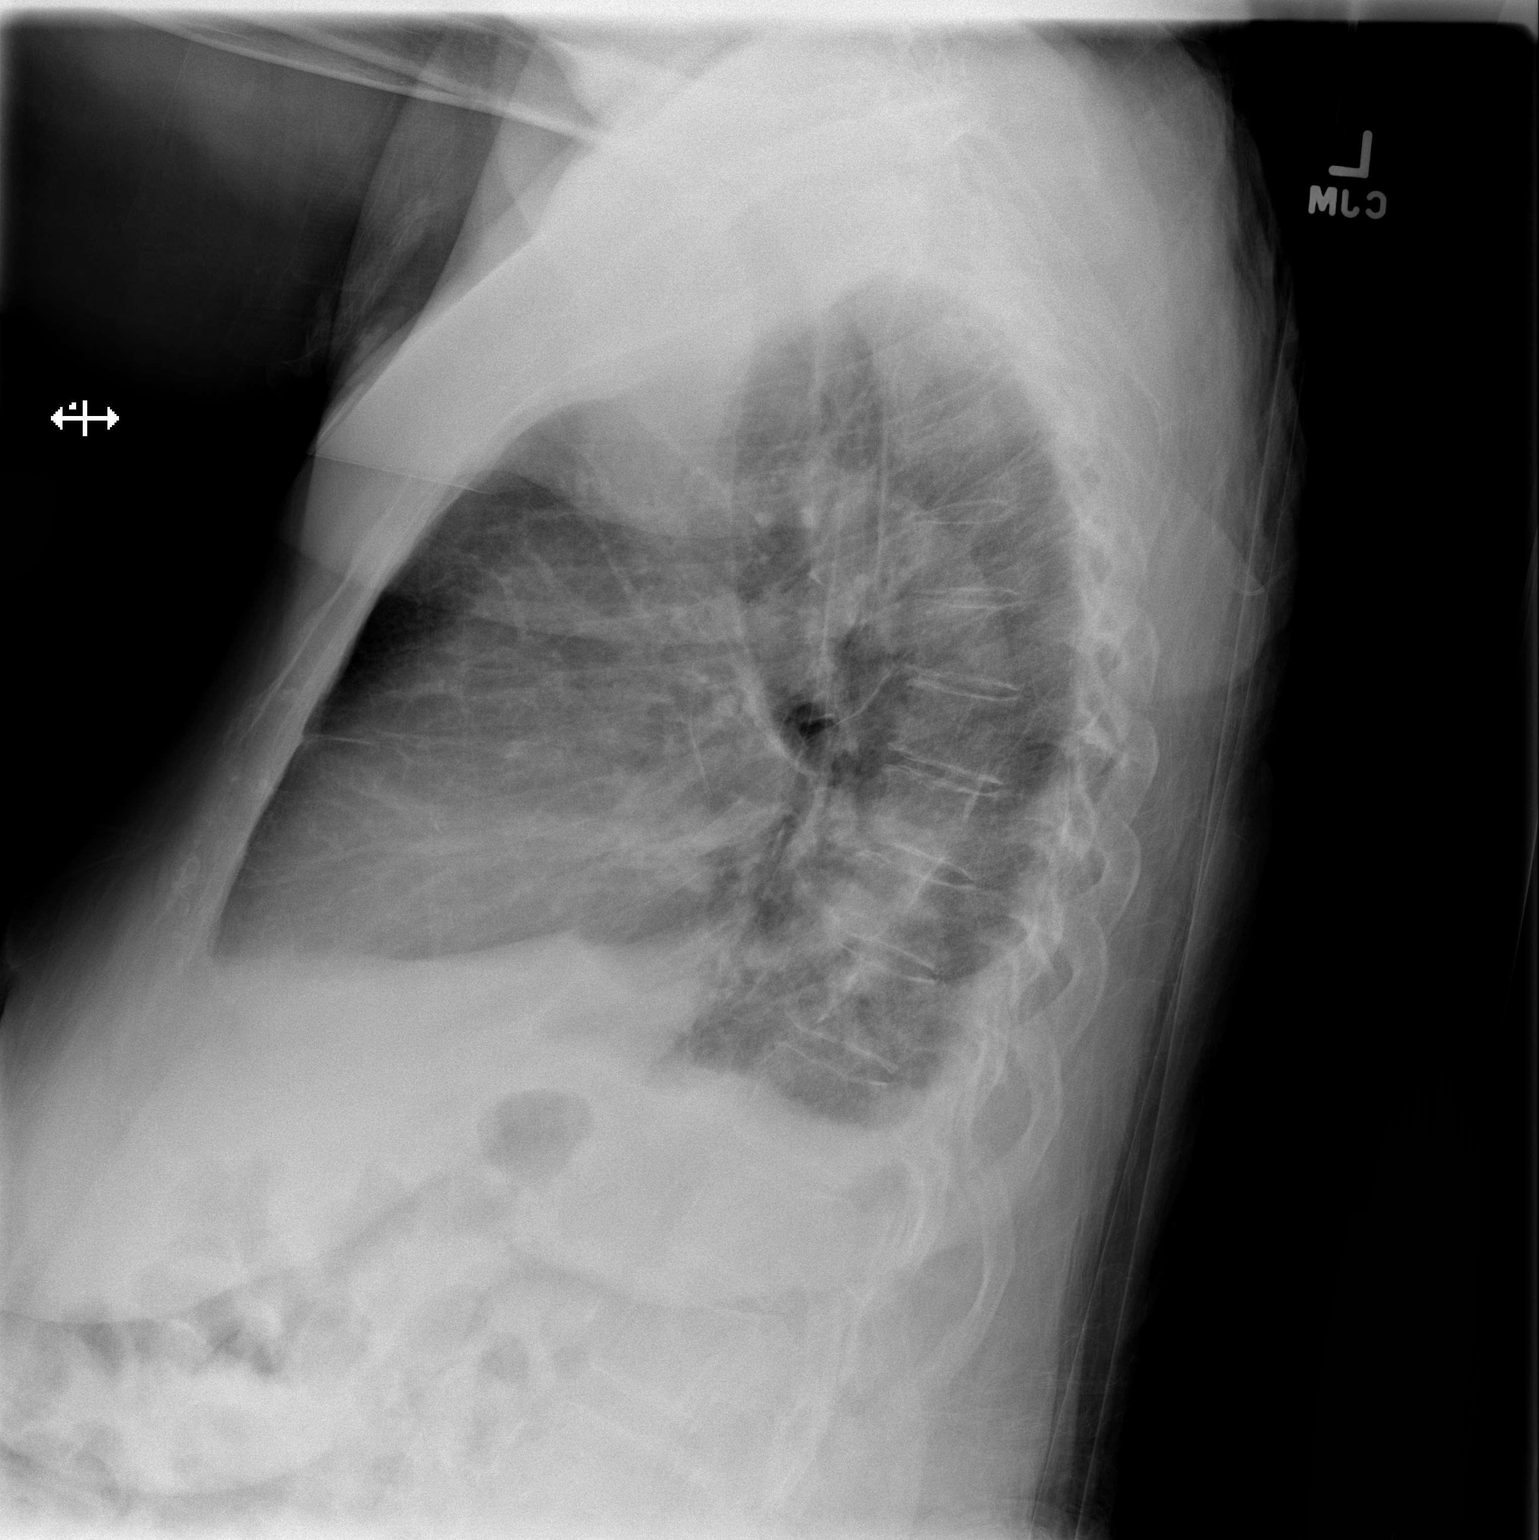

[x chest ap]
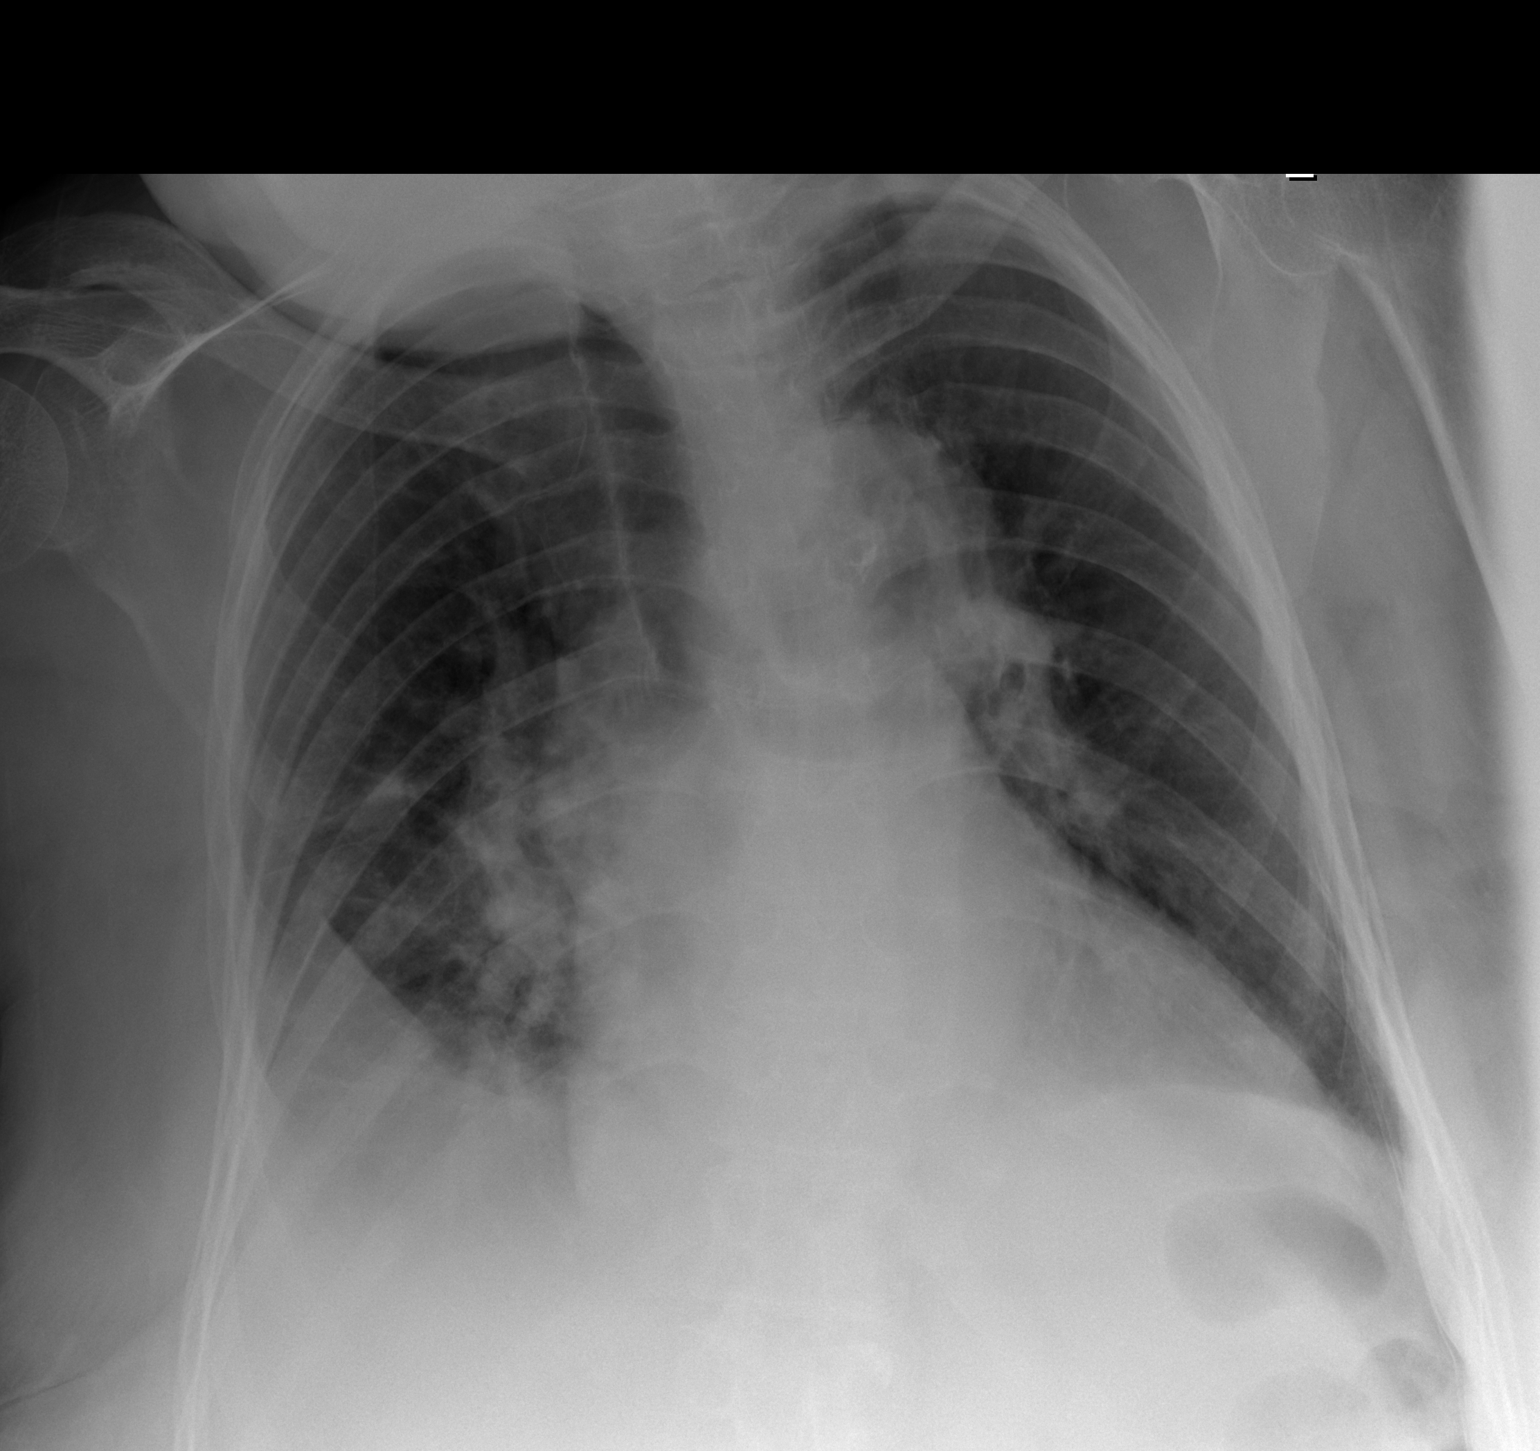

[2 of 2 positions shown; findings below may reference images not displayed]

FINDINGS: Cardiac silhouette is mildly enlarged. There is a new right pleural
effusion. Concern for right lower lobe airspace disease
additionally.
IMPRESSION: 1. New right pleural effusion.
2. Right lobe atelectasis versus infiltrate.
